# Patient Record
Sex: Female | Born: 1965
Health system: Southern US, Community
[De-identification: ages and names within clinical notes are randomized; demographics above are authoritative.]

## PROBLEM LIST (undated history)

## (undated) DIAGNOSIS — E119 Type 2 diabetes mellitus without complications: Secondary | ICD-10-CM

## (undated) DIAGNOSIS — I10 Essential (primary) hypertension: Secondary | ICD-10-CM

## (undated) DIAGNOSIS — R569 Unspecified convulsions: Secondary | ICD-10-CM

## (undated) DIAGNOSIS — R7303 Prediabetes: Secondary | ICD-10-CM

## (undated) HISTORY — DX: Unspecified convulsions: R56.9

## (undated) HISTORY — PX: EYE SURGERY: SHX253

---

## 2014-10-14 ENCOUNTER — Encounter (HOSPITAL_COMMUNITY): Payer: Self-pay | Admitting: Emergency Medicine

## 2014-10-14 ENCOUNTER — Emergency Department (HOSPITAL_COMMUNITY)
Admission: EM | Admit: 2014-10-14 | Discharge: 2014-10-14 | Disposition: A | Discharge status: Home / Self Care | Payer: Self-pay | Attending: Emergency Medicine | Admitting: Emergency Medicine

## 2014-10-14 DIAGNOSIS — R202 Paresthesia of skin: Secondary | ICD-10-CM | POA: Insufficient documentation

## 2014-10-14 DIAGNOSIS — R200 Anesthesia of skin: Secondary | ICD-10-CM | POA: Insufficient documentation

## 2014-10-14 DIAGNOSIS — I10 Essential (primary) hypertension: Secondary | ICD-10-CM | POA: Insufficient documentation

## 2014-10-14 DIAGNOSIS — B029 Zoster without complications: Secondary | ICD-10-CM | POA: Insufficient documentation

## 2014-10-14 DIAGNOSIS — Z880 Allergy status to penicillin: Secondary | ICD-10-CM | POA: Insufficient documentation

## 2014-10-14 HISTORY — DX: Essential (primary) hypertension: I10

## 2014-10-14 HISTORY — DX: Prediabetes: R73.03

## 2014-10-14 NOTE — ED Provider Notes (Signed)
CSN: [id number]     Arrival date & time 10/14/14  0930 History   First MD Initiated Contact with Patient 10/14/14 0957     Chief Complaint  Patient presents with  . Numbness  . Rash     (Consider location/radiation/quality/duration/timing/severity/associated sxs/prior Treatment) HPI   Tricia Bridges is a 49 y.o. female who presents for evaluation of rash left arm present for 7 days with intermittent tingling in her left arm for 2 weeks. No headache, neck pain, back pain, nausea, vomiting, weakness or dizziness. No known sick exposures. There are no other known modifying factors.   Past Medical History  Diagnosis Date  . Hypertension   . Pre-diabetes    Past Surgical History  Procedure Laterality Date  . Cesarean section     History reviewed. No pertinent family history. History  Substance Use Topics  . Smoking status: Never Smoker   . Smokeless tobacco: Never Used  . Alcohol Use: No   OB History    No data available     Review of Systems  All other systems reviewed and are negative.     Allergies  Penicillins  Home Medications   Prior to Admission medications   Not on File   BP 147/91 mmHg  Pulse 81  Temp(Src) 97.7 F (36.5 C) (Oral)  SpO2 99% Physical Exam  Constitutional: Tricia Bridges is oriented to person, place, and time. Tricia Bridges appears well-developed and well-nourished.  HENT:  Head: Normocephalic and atraumatic.  Right Ear: External ear normal.  Left Ear: External ear normal.  Eyes: Conjunctivae and EOM are normal. Pupils are equal, round, and reactive to light.  Neck: Normal range of motion and phonation normal. Neck supple.  Cardiovascular: Normal rate, regular rhythm and normal heart sounds.   Pulmonary/Chest: Effort normal and breath sounds normal. Tricia Bridges exhibits no bony tenderness.  Abdominal: Soft. There is no tenderness.  Musculoskeletal: Normal range of motion.  Neurological: Tricia Bridges is alert and oriented to person, place, and time. No cranial nerve  deficit or sensory deficit. Tricia Bridges exhibits normal muscle tone. Coordination normal.  No dysarthria and aphasia or nystagmus  Skin: Skin is warm, dry and intact.  Red raised papules, with excoriation and a patch about 8 cm in diameter, consistent with resolving shingles.  Psychiatric: Tricia Bridges has a normal mood and affect. Her behavior is normal. Judgment and thought content normal.  Nursing note and vitals reviewed.   ED Course  Procedures (including critical care time) Labs Review Labs Reviewed - No data to display  Imaging Review No results found.   EKG Interpretation None      MDM   Final diagnoses:  Shingles    Shingles, subacute, not amenable to treatment with an epidural medications. Tricia Bridges is not immunocompromised. Symptomatically treatment is indicated.  Nursing Notes Reviewed/ Care Coordinated Applicable Imaging Reviewed Interpretation of Laboratory Data incorporated into ED treatment  The patient appears reasonably screened and/or stabilized for discharge and I doubt any other medical condition or other [facility] requiring further screening, evaluation, or treatment in the ED at this time prior to discharge.  Plan: Home Medications- OTC prn; Home Treatments- avoid children and pregnant people until rash improved, rest; return here if the recommended treatment, does not improve the symptoms; Recommended follow up- PCP prn    Mancel BaleElliott Aianna Fahs, MD 10/14/14 1236

## 2014-10-14 NOTE — ED Notes (Signed)
Pt from home with c/o left arm tingling, numbness, and pain x 2 weeks.  A small clustered red rash on her upper arm x 1 week that is currently scabbing over.  Pt denies fevers at home, NAD, A&O, ambulatory.

## 2014-10-14 NOTE — Discharge Instructions (Signed)

## 2015-04-07 ENCOUNTER — Emergency Department (HOSPITAL_COMMUNITY)
Admission: EM | Admit: 2015-04-07 | Discharge: 2015-04-08 | Disposition: A | Discharge status: Home / Self Care | Payer: Medicaid Other | Attending: Emergency Medicine | Admitting: Emergency Medicine

## 2015-04-07 ENCOUNTER — Encounter (HOSPITAL_COMMUNITY): Payer: Self-pay | Admitting: Oncology

## 2015-04-07 DIAGNOSIS — Z880 Allergy status to penicillin: Secondary | ICD-10-CM | POA: Diagnosis not present

## 2015-04-07 DIAGNOSIS — R300 Dysuria: Secondary | ICD-10-CM | POA: Diagnosis not present

## 2015-04-07 DIAGNOSIS — R350 Frequency of micturition: Secondary | ICD-10-CM | POA: Insufficient documentation

## 2015-04-07 DIAGNOSIS — R739 Hyperglycemia, unspecified: Secondary | ICD-10-CM | POA: Diagnosis present

## 2015-04-07 DIAGNOSIS — Z79899 Other long term (current) drug therapy: Secondary | ICD-10-CM | POA: Insufficient documentation

## 2015-04-07 DIAGNOSIS — I10 Essential (primary) hypertension: Secondary | ICD-10-CM | POA: Insufficient documentation

## 2015-04-07 DIAGNOSIS — R3915 Urgency of urination: Secondary | ICD-10-CM | POA: Insufficient documentation

## 2015-04-07 LAB — BASIC METABOLIC PANEL
Anion gap: 12 (ref 5–15)
BUN: 13 mg/dL (ref 6–20)
CHLORIDE: 96 mmol/L — AB (ref 101–111)
CO2: 26 mmol/L (ref 22–32)
CREATININE: 1.15 mg/dL — AB (ref 0.44–1.00)
Calcium: 10.3 mg/dL (ref 8.9–10.3)
GFR calc Af Amer: >60 mL/min (ref >60)
GFR calc non Af Amer: 54 mL/min — ABNORMAL LOW (ref >60)
GLUCOSE: 561 mg/dL — AB (ref 65–99)
POTASSIUM: 4 mmol/L (ref 3.5–5.1)
SODIUM: 134 mmol/L — AB (ref 135–145)

## 2015-04-07 LAB — CBG MONITORING, ED
Glucose-Capillary: 535 mg/dL — ABNORMAL HIGH (ref 65–99)
Glucose-Capillary: 455 mg/dL — ABNORMAL HIGH (ref 65–99)

## 2015-04-07 LAB — URINALYSIS, ROUTINE W REFLEX MICROSCOPIC
Bilirubin Urine: NEGATIVE
KETONES UR: NEGATIVE mg/dL
LEUKOCYTES UA: NEGATIVE
Nitrite: NEGATIVE
PH: 5.5 (ref 5.0–8.0)
PROTEIN: NEGATIVE mg/dL
Specific Gravity, Urine: 1.035 — ABNORMAL HIGH (ref 1.005–1.030)

## 2015-04-07 LAB — CBC
HEMATOCRIT: 45.8 % (ref 36.0–46.0)
Hemoglobin: 15.9 g/dL — ABNORMAL HIGH (ref 12.0–15.0)
MCH: 29.1 pg (ref 26.0–34.0)
MCHC: 34.7 g/dL (ref 30.0–36.0)
MCV: 83.7 fL (ref 78.0–100.0)
PLATELETS: 271 10*3/uL (ref 150–400)
RBC: 5.47 MIL/uL — ABNORMAL HIGH (ref 3.87–5.11)
RDW: 12.7 % (ref 11.5–15.5)
WBC: 8.1 10*3/uL (ref 4.0–10.5)

## 2015-04-07 LAB — URINE MICROSCOPIC-ADD ON

## 2015-04-07 MED ORDER — INSULIN ASPART 100 UNIT/ML ~~LOC~~ SOLN
10.0000 [IU] | Freq: Once | SUBCUTANEOUS | Status: AC
  Administered 2015-04-07: 10 [IU] via SUBCUTANEOUS
  Filled 2015-04-07: qty 1

## 2015-04-07 MED ORDER — SODIUM CHLORIDE 0.9 % IV BOLUS (SEPSIS)
1000.0000 mL | Freq: Once | INTRAVENOUS | Status: AC
  Administered 2015-04-07: 1000 mL via INTRAVENOUS

## 2015-04-07 MED ORDER — HYDROCHLOROTHIAZIDE 12.5 MG PO TABS: 12.5000 mg | ORAL_TABLET | Freq: Every day | ORAL | Status: DC

## 2015-04-07 MED ORDER — ENALAPRIL MALEATE 10 MG PO TABS: 5.0000 mg | ORAL_TABLET | Freq: Every day | ORAL | Status: DC

## 2015-04-07 MED ORDER — METFORMIN HCL 500 MG PO TABS: 500.0000 mg | ORAL_TABLET | Freq: Two times a day (BID) | ORAL | Status: DC

## 2015-04-07 NOTE — Discharge Instructions (Signed)
Hyperglycemia °Hyperglycemia occurs when the glucose (sugar) in your blood is too high. Hyperglycemia can happen for many reasons, but it most often happens to people who do not know they have diabetes or are not managing their diabetes properly.  °CAUSES  °Whether you have diabetes or not, there are other causes of hyperglycemia. Hyperglycemia can occur when you have diabetes, but it can also occur in other situations that you might not be as aware of, such as: °Diabetes °· If you have diabetes and are having problems controlling your blood glucose, hyperglycemia could occur because of some of the following reasons: °¨ Not following your meal plan. °¨ Not taking your diabetes medications or not taking it properly. °¨ Exercising less or doing less activity than you normally do. °¨ Being sick. °Pre-diabetes °· This cannot be ignored. Before people develop Type 2 diabetes, they almost always have "pre-diabetes." This is when your blood glucose levels are higher than normal, but not yet high enough to be diagnosed as diabetes. Research has shown that some long-term damage to the body, especially the heart and circulatory system, may already be occurring during pre-diabetes. If you take action to manage your blood glucose when you have pre-diabetes, you may delay or prevent Type 2 diabetes from developing. °Stress °· If you have diabetes, you may be "diet" controlled or on oral medications or insulin to control your diabetes. However, you may find that your blood glucose is higher than usual in the hospital whether you have diabetes or not. This is often referred to as "stress hyperglycemia." Stress can elevate your blood glucose. This happens because of hormones put out by the body during times of stress. If stress has been the cause of your high blood glucose, it can be followed regularly by your caregiver. That way he/she can make sure your hyperglycemia does not continue to get worse or progress to  diabetes. °Steroids °· Steroids are medications that act on the infection fighting system (immune system) to block inflammation or infection. One side effect can be a rise in blood glucose. Most people can produce enough extra insulin to allow for this rise, but for those who cannot, steroids make blood glucose levels go even higher. It is not unusual for steroid treatments to "uncover" diabetes that is developing. It is not always possible to determine if the hyperglycemia will go away after the steroids are stopped. A special blood test called an A1c is sometimes done to determine if your blood glucose was elevated before the steroids were started. °SYMPTOMS °· Thirsty. °· Frequent urination. °· Dry mouth. °· Blurred vision. °· Tired or fatigue. °· Weakness. °· Sleepy. °· Tingling in feet or leg. °DIAGNOSIS  °Diagnosis is made by monitoring blood glucose in one or all of the following ways: °· A1c test. This is a chemical found in your blood. °· Fingerstick blood glucose monitoring. °· Laboratory results. °TREATMENT  °First, knowing the cause of the hyperglycemia is important before the hyperglycemia can be treated. Treatment may include, but is not be limited to: °· Education. °· Change or adjustment in medications. °· Change or adjustment in meal plan. °· Treatment for an illness, infection, etc. °· More frequent blood glucose monitoring. °· Change in exercise plan. °· Decreasing or stopping steroids. °· Lifestyle changes. °HOME CARE INSTRUCTIONS  °· Test your blood glucose as directed. °· Exercise regularly. Your caregiver will give you instructions about exercise. Pre-diabetes or diabetes which comes on with stress is helped by exercising. °· Eat wholesome,   balanced meals. Eat often and at regular, fixed times. Your caregiver or nutritionist will give you a meal plan to guide your sugar intake. °· Being at an ideal weight is important. If needed, losing as little as 10 to 15 pounds may help improve blood  glucose levels. °SEEK MEDICAL CARE IF:  °· You have questions about medicine, activity, or diet. °· You continue to have symptoms (problems such as increased thirst, urination, or weight gain). °SEEK IMMEDIATE MEDICAL CARE IF:  °· You are vomiting or have diarrhea. °· Your breath smells fruity. °· You are breathing faster or slower. °· You are very sleepy or incoherent. °· You have numbness, tingling, or pain in your feet or hands. °· You have chest pain. °· Your symptoms get worse even though you have been following your caregiver's orders. °· If you have any other questions or concerns. °  °This information is not intended to replace advice given to you by your health care provider. Make sure you discuss any questions you have with your health care provider. °  °Document Released: 09/23/2000 Document Revised: 06/22/2011 Document Reviewed: 12/04/2014 °Elsevier Interactive Patient Education ©2016 [employer] ° °

## 2015-04-07 NOTE — ED Notes (Signed)
Pt reports polydipsia, frequent urination x 2 weeks.  Pt check a CBG at home that said high.  Pt reports being borderline diabetic.  Recently moved to the area and has not obtained a PCP.  Denies pain/N/V.

## 2015-04-07 NOTE — ED Provider Notes (Signed)
CSN: [id number]     Arrival date & time 04/07/15  1946 History   First MD Initiated Contact with Patient 04/07/15 2010     Chief Complaint  Patient presents with  . Hyperglycemia     (Consider location/radiation/quality/duration/timing/severity/associated sxs/prior Treatment) HPI Comments: Patient presents to the emergency department with chief complaint of polydipsia and polyuria 2 weeks. Her significant other is a diabetic, and checked her blood sugar. The CBG read greater than 500. Patient reports that she is a borderline diabetic, and normally takes metformin, but she is new to the area and does not have a primary care doctor. She has been unable to have any of her medications filled. She denies any chest pain, shortness of breath, abdominal pain, nausea, vomiting, or diarrhea. She does report some dysuria, but denies any other symptoms. There are no aggravating or alleviating factors.  The history is provided by the patient. No language interpreter was used.    Past Medical History  Diagnosis Date  . Hypertension   . Pre-diabetes    Past Surgical History  Procedure Laterality Date  . Cesarean section     No family history on file. Social History  Substance Use Topics  . Smoking status: Never Smoker   . Smokeless tobacco: Never Used  . Alcohol Use: No   OB History    No data available     Review of Systems  Constitutional: Negative for fever and chills.  Respiratory: Negative for shortness of breath.   Cardiovascular: Negative for chest pain.  Gastrointestinal: Negative for nausea, vomiting, diarrhea and constipation.  Genitourinary: Positive for dysuria, urgency and frequency.  All other systems reviewed and are negative.     Allergies  Penicillins  Home Medications   Prior to Admission medications   Medication Sig Start Date End Date Taking? Authorizing Provider  Cholecalciferol (VITAMIN D PO) Take 1 tablet by mouth daily.   Yes Historical Provider, MD   VITAMIN E PO Take 1 tablet by mouth daily.   Yes Historical Provider, MD   BP 163/88 mmHg  Pulse 77  Temp(Src) 98.6 F (37 C) (Oral)  Resp 20  Ht 5\' 4"  (1.626 m)  Wt 81.647 kg  BMI 30.88 kg/m2  SpO2 100% Physical Exam  Constitutional: She is oriented to person, place, and time. She appears well-developed and well-nourished.  HENT:  Head: Normocephalic and atraumatic.  Eyes: Conjunctivae and EOM are normal. Pupils are equal, round, and reactive to light.  Neck: Normal range of motion. Neck supple.  Cardiovascular: Normal rate and regular rhythm.  Exam reveals no gallop and no friction rub.   No murmur heard. Pulmonary/Chest: Effort normal and breath sounds normal. No respiratory distress. She has no wheezes. She has no rales. She exhibits no tenderness.  Abdominal: Soft. Bowel sounds are normal. She exhibits no distension and no mass. There is no tenderness. There is no rebound and no guarding.  No focal abdominal tenderness, no RLQ tenderness or pain at McBurney's point, no RUQ tenderness or Murphy's sign, no left-sided abdominal tenderness, no fluid wave, or signs of peritonitis   Musculoskeletal: Normal range of motion. She exhibits no edema or tenderness.  Neurological: She is alert and oriented to person, place, and time.  Skin: Skin is warm and dry.  Psychiatric: She has a normal mood and affect. Her behavior is normal. Judgment and thought content normal.  Nursing note and vitals reviewed.   ED Course  Procedures (including critical care time) Results for orders placed or performed  during the hospital encounter of 04/07/15  Basic metabolic panel  Result Value Ref Range   Sodium 134 (L) 135 - 145 mmol/L   Potassium 4.0 3.5 - 5.1 mmol/L   Chloride 96 (L) 101 - 111 mmol/L   CO2 26 22 - 32 mmol/L   Glucose, Bld 561 (HH) 65 - 99 mg/dL   BUN 13 6 - 20 mg/dL   Creatinine, Ser 6.04 (H) 0.44 - 1.00 mg/dL   Calcium 54.0 8.9 - 98.1 mg/dL   GFR calc non Af Amer 54 (L) >60  mL/min   GFR calc Af Amer >60 >60 mL/min   Anion gap 12 5 - 15  CBC  Result Value Ref Range   WBC 8.1 4.0 - 10.5 K/uL   RBC 5.47 (H) 3.87 - 5.11 MIL/uL   Hemoglobin 15.9 (H) 12.0 - 15.0 g/dL   HCT 19.1 47.8 - 29.5 %   MCV 83.7 78.0 - 100.0 fL   MCH 29.1 26.0 - 34.0 pg   MCHC 34.7 30.0 - 36.0 g/dL   RDW 62.1 30.8 - 65.7 %   Platelets 271 150 - 400 K/uL  Urinalysis, Routine w reflex microscopic (not at [facility])  Result Value Ref Range   Color, Urine YELLOW YELLOW   APPearance CLEAR CLEAR   Specific Gravity, Urine 1.035 (H) 1.005 - 1.030   pH 5.5 5.0 - 8.0   Glucose, UA >1000 (A) NEGATIVE mg/dL   Hgb urine dipstick TRACE (A) NEGATIVE   Bilirubin Urine NEGATIVE NEGATIVE   Ketones, ur NEGATIVE NEGATIVE mg/dL   Protein, ur NEGATIVE NEGATIVE mg/dL   Nitrite NEGATIVE NEGATIVE   Leukocytes, UA NEGATIVE NEGATIVE  Urine microscopic-add on  Result Value Ref Range   Squamous Epithelial / LPF 6-30 (A) NONE SEEN   WBC, UA 6-30 0 - 5 WBC/hpf   RBC / HPF 0-5 0 - 5 RBC/hpf   Bacteria, UA MANY (A) NONE SEEN   Urine-Other YEAST PRESENT   CBG monitoring, ED  Result Value Ref Range   Glucose-Capillary 535 (H) 65 - 99 mg/dL   Comment 1 Notify RN    Comment 2 Document in Chart   CBG monitoring, ED  Result Value Ref Range   Glucose-Capillary 455 (H) 65 - 99 mg/dL   Comment 1 Notify RN    Comment 2 Document in Chart    No results found.  I have personally reviewed and evaluated these images and lab results as part of my medical decision-making.   EKG Interpretation None      MDM   Final diagnoses:  Hyperglycemia    Patient with hyperglycemia. CBG is 535, will give fluids and insulin. Doubt DKA, patient is very well-appearing. She is not having any abdominal pain, nausea, vomiting, or respiratory distress. Will check electrolytes for further evaluation. Anticipate discharge to home after fluids and insulin pending reassuring labs.  CBG trending down with fluids and insulin. Anion gap  is normal. Patient is very well-appearing. She is stable for discharge. Recommend outpatient follow-up. I will refill the patient's HCTZ and enalapril and metformin.  Recommend PCP follow-up.    Roxy Horseman, PA-C 04/07/15 8469  Arby Barrette, MD 04/20/15 [phone]

## 2015-04-08 LAB — CBG MONITORING, ED

## 2016-06-15 ENCOUNTER — Emergency Department (HOSPITAL_COMMUNITY)
Admission: EM | Admit: 2016-06-15 | Discharge: 2016-06-15 | Disposition: A | Discharge status: Home / Self Care | Payer: Medicaid Other | Attending: Emergency Medicine | Admitting: Emergency Medicine

## 2016-06-15 ENCOUNTER — Encounter (HOSPITAL_COMMUNITY): Payer: Self-pay | Admitting: Nurse Practitioner

## 2016-06-15 DIAGNOSIS — Z7982 Long term (current) use of aspirin: Secondary | ICD-10-CM | POA: Insufficient documentation

## 2016-06-15 DIAGNOSIS — E1165 Type 2 diabetes mellitus with hyperglycemia: Secondary | ICD-10-CM

## 2016-06-15 DIAGNOSIS — N289 Disorder of kidney and ureter, unspecified: Secondary | ICD-10-CM | POA: Insufficient documentation

## 2016-06-15 DIAGNOSIS — I10 Essential (primary) hypertension: Secondary | ICD-10-CM | POA: Insufficient documentation

## 2016-06-15 DIAGNOSIS — N189 Chronic kidney disease, unspecified: Secondary | ICD-10-CM

## 2016-06-15 DIAGNOSIS — E860 Dehydration: Secondary | ICD-10-CM

## 2016-06-15 HISTORY — DX: Type 2 diabetes mellitus without complications: E11.9

## 2016-06-15 LAB — BASIC METABOLIC PANEL
ANION GAP: 14 (ref 5–15)
BUN: 19 mg/dL (ref 6–20)
CALCIUM: 10.2 mg/dL (ref 8.9–10.3)
CO2: 28 mmol/L (ref 22–32)
Chloride: 96 mmol/L — ABNORMAL LOW (ref 101–111)
Creatinine, Ser: 1.41 mg/dL — ABNORMAL HIGH (ref 0.44–1.00)
GFR, EST AFRICAN AMERICAN: 49 mL/min — AB (ref >60)
GFR, EST NON AFRICAN AMERICAN: 42 mL/min — AB (ref >60)
Glucose, Bld: 838 mg/dL (ref 65–99)
POTASSIUM: 4.5 mmol/L (ref 3.5–5.1)
Sodium: 138 mmol/L (ref 135–145)

## 2016-06-15 LAB — URINALYSIS, ROUTINE W REFLEX MICROSCOPIC
Bilirubin Urine: NEGATIVE
Glucose, UA: >=500 mg/dL — AB
Hgb urine dipstick: NEGATIVE
KETONES UR: 5 mg/dL — AB
Leukocytes, UA: NEGATIVE
Nitrite: NEGATIVE
PROTEIN: NEGATIVE mg/dL
Specific Gravity, Urine: 1.026 (ref 1.005–1.030)
pH: 5 (ref 5.0–8.0)

## 2016-06-15 LAB — CBC
HEMATOCRIT: 47.5 % — AB (ref 36.0–46.0)
HEMOGLOBIN: 16 g/dL — AB (ref 12.0–15.0)
MCH: 29 pg (ref 26.0–34.0)
MCHC: 33.7 g/dL (ref 30.0–36.0)
MCV: 86.1 fL (ref 78.0–100.0)
Platelets: 232 10*3/uL (ref 150–400)
RBC: 5.52 MIL/uL — AB (ref 3.87–5.11)
RDW: 13.4 % (ref 11.5–15.5)
WBC: 10.5 10*3/uL (ref 4.0–10.5)

## 2016-06-15 LAB — CBG MONITORING, ED
GLUCOSE-CAPILLARY: 519 mg/dL — AB (ref 65–99)
Glucose-Capillary: 355 mg/dL — ABNORMAL HIGH (ref 65–99)

## 2016-06-15 MED ORDER — SODIUM CHLORIDE 0.9 % IV BOLUS (SEPSIS)
1000.0000 mL | Freq: Once | INTRAVENOUS | Status: AC
  Administered 2016-06-15: 1000 mL via INTRAVENOUS

## 2016-06-15 MED ORDER — INSULIN ASPART 100 UNIT/ML ~~LOC~~ SOLN
10.0000 [IU] | Freq: Once | SUBCUTANEOUS | Status: AC
  Administered 2016-06-15: 10 [IU] via INTRAVENOUS
  Filled 2016-06-15: qty 1

## 2016-06-15 NOTE — ED Provider Notes (Signed)
[facility]-EMERGENCY DEPT Provider Note   CSN: [id number] Arrival date & time: 06/15/16  1634     History   Chief Complaint Chief Complaint  Patient presents with  . Hyperglycemia    HPI  Tricia Bridges is a 51 y.o. female.  Pt presents to the ED today with elevated blood sugar.  She said her meter's been reading high for about 2 weeks.  She has been taking her metformin as directed.  However, she also admits to going on a fruit diet to "get away from meat."  The pt c/o feeling tired, having a dry mouth, and having a poor appetite.      Past Medical History:  Diagnosis Date  . Diabetes mellitus without complication (HCC)   . Hypertension   . Pre-diabetes     There are no active problems to display for this patient.   Past Surgical History:  Procedure Laterality Date  . CESAREAN SECTION      OB History    No data available       Home Medications    Prior to Admission medications   Medication Sig Start Date End Date Taking? Authorizing Provider  aspirin 325 MG tablet Take 325 mg by mouth daily.   Yes Historical Provider, MD  Cholecalciferol (VITAMIN D PO) Take 1 tablet by mouth daily.   Yes Historical Provider, MD  enalapril (VASOTEC) 10 MG tablet Take 0.5 tablets (5 mg total) by mouth daily. 04/07/15  Yes Roxy Horsemanobert Browning, PA-C  hydrochlorothiazide (HYDRODIURIL) 12.5 MG tablet Take 1 tablet (12.5 mg total) by mouth daily. 04/07/15  Yes Roxy Horsemanobert Browning, PA-C  metFORMIN (GLUCOPHAGE) 500 MG tablet Take 1 tablet (500 mg total) by mouth 2 (two) times daily with a meal. 04/07/15  Yes Roxy Horsemanobert Browning, PA-C  VITAMIN E PO Take 1 tablet by mouth daily.   Yes Historical Provider, MD    Family History History reviewed. No pertinent family history.  Social History Social History  Substance Use Topics  . Smoking status: Never Smoker  . Smokeless tobacco: Never Used  . Alcohol use No     Allergies   Penicillins   Review of Systems Review of Systems  Constitutional:  Positive for appetite change.  Endocrine: Positive for polydipsia and polyuria.  All other systems reviewed and are negative.    Physical Exam Updated Vital Signs BP 147/85   Pulse 93   Temp 98.9 F (37.2 C) (Oral)   Resp 18   SpO2 100%   Physical Exam  Constitutional: She is oriented to person, place, and time. She appears well-developed and well-nourished.  HENT:  Head: Normocephalic and atraumatic.  Right Ear: External ear normal.  Left Ear: External ear normal.  Nose: Nose normal.  Mouth/Throat: Mucous membranes are dry.  Eyes: Conjunctivae and EOM are normal. Pupils are equal, round, and reactive to light.  Neck: Normal range of motion. Neck supple.  Cardiovascular: Normal rate, regular rhythm, normal heart sounds and intact distal pulses.   Pulmonary/Chest: Effort normal and breath sounds normal.  Abdominal: Soft. Bowel sounds are normal.  Musculoskeletal: Normal range of motion.  Neurological: She is alert and oriented to person, place, and time.  Skin: Skin is warm.  Psychiatric: She has a normal mood and affect. Her behavior is normal. Judgment and thought content normal.  Nursing note and vitals reviewed.    ED Treatments / Results  Labs (all labs ordered are listed, but only abnormal results are displayed) Labs Reviewed  BASIC METABOLIC PANEL - Abnormal; Notable for  the following:       Result Value   Chloride 96 (*)    Glucose, Bld 838 (*)    Creatinine, Ser 1.41 (*)    GFR calc non Af Amer 42 (*)    GFR calc Af Amer 49 (*)    All other components within normal limits  CBC - Abnormal; Notable for the following:    RBC 5.52 (*)    Hemoglobin 16.0 (*)    HCT 47.5 (*)    All other components within normal limits  URINALYSIS, ROUTINE W REFLEX MICROSCOPIC - Abnormal; Notable for the following:    Color, Urine STRAW (*)    Glucose, UA >=500 (*)    Ketones, ur 5 (*)    Bacteria, UA RARE (*)    Squamous Epithelial / LPF 0-5 (*)    All other components  within normal limits  CBG MONITORING, ED - Abnormal; Notable for the following:    Glucose-Capillary >600 (*)    All other components within normal limits  CBG MONITORING, ED - Abnormal; Notable for the following:    Glucose-Capillary 519 (*)    All other components within normal limits  CBG MONITORING, ED - Abnormal; Notable for the following:    Glucose-Capillary 355 (*)    All other components within normal limits  CBG MONITORING, ED  CBG MONITORING, ED  CBG MONITORING, ED  CBG MONITORING, ED    EKG  EKG Interpretation None       Radiology No results found.  Procedures Procedures (including critical care time)  Medications Ordered in ED Medications  sodium chloride 0.9 % bolus 1,000 mL (1,000 mLs Intravenous New Bag/Given 06/15/16 1951)  insulin aspart (novoLOG) injection 10 Units (10 Units Intravenous Given 06/15/16 1950)  sodium chloride 0.9 % bolus 1,000 mL (1,000 mLs Intravenous New Bag/Given 06/15/16 1951)  insulin aspart (novoLOG) injection 10 Units (10 Units Intravenous Given 06/15/16 2057)     Initial Impression / Assessment and Plan / ED Course  I have reviewed the triage vital signs and the nursing notes.  Pertinent labs & imaging results that were available during my care of the patient were reviewed by me and considered in my medical decision making (see chart for details).    Blood sugar is much improved after 2L NS and 20 units novolog insulin.  Pt is feeling much better.  She is instructed to continue the metformin as directed by her doctor, but avoid the fruit diets.  She is encouraged to eat a diabetic diet and return if worse.  Final Clinical Impressions(s) / ED Diagnoses   Final diagnoses:  Poorly controlled type 2 diabetes mellitus (HCC)  Dehydration  Chronic renal impairment, unspecified CKD stage    New Prescriptions New Prescriptions   No medications on file     Jacalyn Lefevre, MD 06/15/16 2231

## 2016-06-15 NOTE — ED Triage Notes (Signed)
Pt presents with c/o hyperglycemia. The symptoms began about 2 weeks ago. She reports her meter has been reading HIGH at home. She has been taking her metformin daily as prescribed with no improvement in her blood sugar. She was taken off her insulin last year due to normal blood glucose levels. This week she's noticed malaise, dry mouth, poor appetite.

## 2017-07-29 DIAGNOSIS — G5603 Carpal tunnel syndrome, bilateral upper limbs: Secondary | ICD-10-CM | POA: Diagnosis not present

## 2017-08-06 ENCOUNTER — Ambulatory Visit: Payer: Self-pay | Admitting: Family Medicine

## 2017-08-09 DIAGNOSIS — G5603 Carpal tunnel syndrome, bilateral upper limbs: Secondary | ICD-10-CM | POA: Diagnosis not present

## 2017-08-13 ENCOUNTER — Encounter: Payer: Self-pay | Admitting: Family Medicine

## 2017-08-13 ENCOUNTER — Ambulatory Visit: Payer: BLUE CROSS/BLUE SHIELD | Admitting: Family Medicine

## 2017-08-13 VITALS — BP 160/100 | HR 64 | Temp 98.0°F | Ht 63.0 in | Wt 157.4 lb

## 2017-08-13 DIAGNOSIS — E1165 Type 2 diabetes mellitus with hyperglycemia: Secondary | ICD-10-CM | POA: Insufficient documentation

## 2017-08-13 DIAGNOSIS — Z23 Encounter for immunization: Secondary | ICD-10-CM | POA: Diagnosis not present

## 2017-08-13 DIAGNOSIS — E119 Type 2 diabetes mellitus without complications: Secondary | ICD-10-CM | POA: Insufficient documentation

## 2017-08-13 DIAGNOSIS — I10 Essential (primary) hypertension: Secondary | ICD-10-CM | POA: Diagnosis not present

## 2017-08-13 DIAGNOSIS — Z794 Long term (current) use of insulin: Secondary | ICD-10-CM

## 2017-08-13 DIAGNOSIS — Z1239 Encounter for other screening for malignant neoplasm of breast: Secondary | ICD-10-CM | POA: Insufficient documentation

## 2017-08-13 LAB — LIPID PANEL
Cholesterol: 269 mg/dL — ABNORMAL HIGH (ref 0–200)
HDL: 60.5 mg/dL (ref >39.00)
LDL Cholesterol: 189 mg/dL — ABNORMAL HIGH (ref 0–99)
NonHDL: 208.47
Total CHOL/HDL Ratio: 4
Triglycerides: 95 mg/dL (ref 0.0–149.0)
VLDL: 19 mg/dL (ref 0.0–40.0)

## 2017-08-13 LAB — CBC
HCT: 45 % (ref 36.0–46.0)
Hemoglobin: 15.2 g/dL — ABNORMAL HIGH (ref 12.0–15.0)
MCHC: 33.7 g/dL (ref 30.0–36.0)
MCV: 87.6 fl (ref 78.0–100.0)
Platelets: 206 10*3/uL (ref 150.0–400.0)
RBC: 5.13 Mil/uL — AB (ref 3.87–5.11)
RDW: 13.8 % (ref 11.5–15.5)
WBC: 5.7 10*3/uL (ref 4.0–10.5)

## 2017-08-13 LAB — COMPREHENSIVE METABOLIC PANEL
ALBUMIN: 4.2 g/dL (ref 3.5–5.2)
ALT: 16 U/L (ref 0–35)
AST: 15 U/L (ref 0–37)
Alkaline Phosphatase: 111 U/L (ref 39–117)
BUN: 7 mg/dL (ref 6–23)
CALCIUM: 9.7 mg/dL (ref 8.4–10.5)
CO2: 29 mEq/L (ref 19–32)
CREATININE: 0.76 mg/dL (ref 0.40–1.20)
Chloride: 100 mEq/L (ref 96–112)
GFR: 102.06 mL/min (ref >60.00)
Glucose, Bld: 261 mg/dL — ABNORMAL HIGH (ref 70–99)
Potassium: 3.5 mEq/L (ref 3.5–5.1)
Sodium: 137 mEq/L (ref 135–145)
TOTAL PROTEIN: 7.6 g/dL (ref 6.0–8.3)
Total Bilirubin: 0.4 mg/dL (ref 0.2–1.2)

## 2017-08-13 LAB — POCT UA - MICROALBUMIN: MICROALBUMIN (UR) POC: 150 mg/L

## 2017-08-13 LAB — POCT GLYCOSYLATED HEMOGLOBIN (HGB A1C): Hemoglobin A1C: 11.5

## 2017-08-13 MED ORDER — ENALAPRIL MALEATE 10 MG PO TABS: 10.0000 mg | ORAL_TABLET | Freq: Every day | ORAL | 2 refills | Status: DC

## 2017-08-13 MED ORDER — HYDROCHLOROTHIAZIDE 12.5 MG PO TABS: 25.0000 mg | ORAL_TABLET | Freq: Every day | ORAL | 2 refills | Status: DC

## 2017-08-13 MED ORDER — METFORMIN HCL 1000 MG PO TABS: 1000.0000 mg | ORAL_TABLET | Freq: Two times a day (BID) | ORAL | 2 refills | Status: DC

## 2017-08-13 MED ORDER — PEN NEEDLES 30G X 8 MM MISC
1.0000 [IU] | Freq: Every day | 2 refills | Status: DC
Start: 2017-08-13 — End: 2020-10-01

## 2017-08-13 MED ORDER — INSULIN GLARGINE 100 UNIT/ML SOLOSTAR PEN: 20.0000 [IU] | PEN_INJECTOR | Freq: Every day | SUBCUTANEOUS | 2 refills | Status: DC

## 2017-08-13 NOTE — Patient Instructions (Addendum)
It was very nice to meet you Increase enalapril to  (whole tablet) Increase hydrochlorothiazide to  Increase metformin to  twice per day Start lantus (insulin) 20 units each day.  Check blood sugars daily. Work on getting daily exercise   Diabetes Mellitus and Nutrition When you have diabetes (diabetes mellitus), it is very important to have healthy eating habits because your blood sugar (glucose) levels are greatly affected by what you eat and drink. Eating healthy foods in the appropriate amounts, at about the same times every day, can help you:  Control your blood glucose.  Lower your risk of heart disease.  Improve your blood pressure.  Reach or maintain a healthy weight.  Every person with diabetes is different, and each person has different needs for a meal plan. Your health care provider may recommend that you work with a diet and nutrition specialist (dietitian) to make a meal plan that is best for you. Your meal plan may vary depending on factors such as:  The calories you need.  The medicines you take.  Your weight.  Your blood glucose, blood pressure, and cholesterol levels.  Your activity level.  Other health conditions you have, such as heart or kidney disease.  How do carbohydrates affect me? Carbohydrates affect your blood glucose level more than any other type of food. Eating carbohydrates naturally increases the amount of glucose in your blood. Carbohydrate counting is a method for keeping track of how many carbohydrates you eat. Counting carbohydrates is important to keep your blood glucose at a healthy level, especially if you use insulin or take certain oral diabetes medicines. It is important to know how many carbohydrates you can safely have in each meal. This is different for every person. Your dietitian can help you calculate how many carbohydrates you should have at each meal and for snack. Foods that contain carbohydrates include:  Bread,  cereal, rice, pasta, and crackers.  Potatoes and corn.  Peas, beans, and lentils.  Milk and yogurt.  Fruit and juice.  Desserts, such as cakes, cookies, ice cream, and candy.  How does alcohol affect me? Alcohol can cause a sudden decrease in blood glucose (hypoglycemia), especially if you use insulin or take certain oral diabetes medicines. Hypoglycemia can be a life-threatening condition. Symptoms of hypoglycemia (sleepiness, dizziness, and confusion) are similar to symptoms of having too much alcohol. If your health care provider says that alcohol is safe for you, follow these guidelines:  Limit alcohol intake to no more than 1 drink per day for nonpregnant women and 2 drinks per day for men. One drink equals 12 oz of beer, 5 oz of wine, or 1 oz of hard liquor.  Do not drink on an empty stomach.  Keep yourself hydrated with water, diet soda, or unsweetened iced tea.  Keep in mind that regular soda, juice, and other mixers may contain a lot of sugar and must be counted as carbohydrates.  What are tips for following this plan? Reading food labels  Start by checking the serving size on the label. The amount of calories, carbohydrates, fats, and other nutrients listed on the label are based on one serving of the food. Many foods contain more than one serving per package.  Check the total grams (g) of carbohydrates in one serving. You can calculate the number of servings of carbohydrates in one serving by dividing the total carbohydrates by 15. For example, if a food has 30 g of total carbohydrates, it would be equal to 2  servings of carbohydrates.  Check the number of grams (g) of saturated and trans fats in one serving. Choose foods that have low or no amount of these fats.  Check the number of milligrams (mg) of sodium in one serving. Most people should limit total sodium intake to less than 2,300 mg per day.  Always check the nutrition information of foods labeled as "low-fat"  or "nonfat". These foods may be higher in added sugar or refined carbohydrates and should be avoided.  Talk to your dietitian to identify your daily goals for nutrients listed on the label. Shopping  Avoid buying canned, premade, or processed foods. These foods tend to be high in fat, sodium, and added sugar.  Shop around the outside edge of the grocery store. This includes fresh fruits and vegetables, bulk grains, fresh meats, and fresh dairy. Cooking  Use low-heat cooking methods, such as baking, instead of high-heat cooking methods like deep frying.  Cook using healthy oils, such as olive, canola, or sunflower oil.  Avoid cooking with butter, cream, or high-fat meats. Meal planning  Eat meals and snacks regularly, preferably at the same times every day. Avoid going long periods of time without eating.  Eat foods high in fiber, such as fresh fruits, vegetables, beans, and whole grains. Talk to your dietitian about how many servings of carbohydrates you can eat at each meal.  Eat 4-6 ounces of lean protein each day, such as lean meat, chicken, fish, eggs, or tofu. 1 ounce is equal to 1 ounce of meat, chicken, or fish, 1 egg, or 1/4 cup of tofu.  Eat some foods each day that contain healthy fats, such as avocado, nuts, seeds, and fish. Lifestyle   Check your blood glucose regularly.  Exercise at least 30 minutes 5 or more days each week, or as told by your health care provider.  Take medicines as told by your health care provider.  Do not use any products that contain nicotine or tobacco, such as cigarettes and e-cigarettes. If you need help quitting, ask your health care provider.  Work with a Veterinary surgeon or diabetes educator to identify strategies to manage stress and any emotional and social challenges. What are some questions to ask my health care provider?  Do I need to meet with a diabetes educator?  Do I need to meet with a dietitian?  What number can I call if I have  questions?  When are the best times to check my blood glucose? Where to find more information:  American Diabetes Association: diabetes.org/food-and-fitness/food  Academy of Nutrition and Dietetics: https://www.vargas.com/  [employer] of Diabetes and Digestive and Kidney Diseases (NIH): FindJewelers.cz Summary  A healthy meal plan will help you control your blood glucose and maintain a healthy lifestyle.  Working with a diet and nutrition specialist (dietitian) can help you make a meal plan that is best for you.  Keep in mind that carbohydrates and alcohol have immediate effects on your blood glucose levels. It is important to count carbohydrates and to use alcohol carefully. This information is not intended to replace advice given to you by your health care provider. Make sure you discuss any questions you have with your health care provider. Document Released: 12/25/2004 Document Revised: 05/04/2016 Document Reviewed: 05/04/2016 Elsevier Interactive Patient Education  [employer].

## 2017-08-13 NOTE — Progress Notes (Signed)
Tricia Bridges - 52 y.o. female MRN [id number]  Date of birth: 1965/04/18  Subjective Chief Complaint  Patient presents with  . New Patient (Initial Visit)    Patient is here today to establish care.  She had a couple sips of coffee with cream and sugar in the car this am.  She states that she had a nerve conduction study last week due to carpel tunnel.  Last PAP and Mammogram were completed 3-years-ago.  Colonoscopy completed 3-years-ago.  . Diabetes    She is here today to be seen for her DM.  She states that all of her readings are usually high.    . Hypertension    She also states that she is here for her HTN.  She does not get any readings outside of the office.  Denies any leg edema.    HPI Tricia Bridges is a 52 y.o. female here today to establish with new PCP.  She has a history of T2DM, HTN and is currently being evaluated by orthopedics for bilateral carpal tunnel syndrome.  She reports that it has been a while since she has seen and primary care provider.  She is not up to date on cervical cancer or breast cancer screenings.     -T2DM:  History of diabetes diagnosed a few years ago.  Previously on insulin and metformin however insulin was discontinued, she is unsure why.  She reports that blood sugars at home have been in around 300-350 and her a1c today is 11.5.  She does feel thirsty often and drinks a large amount of water throughout the day.  She denies any vision changes.  She does have numbness/tingling in her hands but denies symptoms in her feet.  She tries to incorporate a good amount of fruits and vegetables into her diet but tells me that she eats a large amount of breads and rice.  She does not exercise regularly.   -HTN:  Current treatment with enalapril and HCTZ.  No monitoring of BP at home.  Does not monitor sodium intake.  Denies chest pain, shortness of breath, palpitations, headaches and/or vision changes.   ROS:  ROS completed and negative except as noted per  HPI  Allergies  Allergen Reactions  . Penicillins Itching    Has patient had a PCN reaction causing immediate rash, facial/tongue/throat swelling, SOB or lightheadedness with hypotension: no Has patient had a PCN reaction causing severe rash involving mucus membranes or skin necrosis: no Has patient had a PCN reaction that required hospitalization: no Has patient had a PCN reaction occurring within the last 10 years: yes If all of the above answers are "NO", then may proceed with Cephalosporin use.     Past Medical History:  Diagnosis Date  . Diabetes mellitus without complication (HCC)   . Hypertension   . Pre-diabetes     Past Surgical History:  Procedure Laterality Date  . CESAREAN SECTION      Social History   Socioeconomic History  . Marital status: Single    Spouse name: Not on file  . Number of children: Not on file  . Years of education: Not on file  . Highest education level: Not on file  Occupational History  . Not on file  Social Needs  . Financial resource strain: Not on file  . Food insecurity:    Worry: Not on file    Inability: Not on file  . Transportation needs:    Medical: Not on file    Non-medical:  Not on file  Tobacco Use  . Smoking status: Never Smoker  . Smokeless tobacco: Never Used  Substance and Sexual Activity  . Alcohol use: No  . Drug use: No  . Sexual activity: Not Currently  Lifestyle  . Physical activity:    Days per week: Not on file    Minutes per session: Not on file  . Stress: Not on file  Relationships  . Social connections:    Talks on phone: Not on file    Gets together: Not on file    Attends religious service: Not on file    Active member of club or organization: Not on file    Attends meetings of clubs or organizations: Not on file    Relationship status: Not on file  Other Topics Concern  . Not on file  Social History Narrative  . Not on file    Family History  Problem Relation Age of Onset  .  Hypertension Mother   . COPD Mother        Smoker  . Alcohol abuse Father   . Hypertension Sister     Health Maintenance  Topic Date Due  . Hepatitis C Screening  18-Jul-1965  . PNEUMOCOCCAL POLYSACCHARIDE VACCINE (1) 10/18/1965  . FOOT EXAM  10/18/1973  . HIV Screening  10/19/1978  . MAMMOGRAM  08/14/2015  . OPHTHALMOLOGY EXAM  08/14/2015  . PAP SMEAR  08/13/2016  . INFLUENZA VACCINE  11/11/2017  . HEMOGLOBIN A1C  02/13/2018  . COLONOSCOPY  08/13/2024  . TETANUS/TDAP  08/14/2027    ----------------------------------------------------------------------------------------------------------------------------------------------------------------------------------------------------------------- Physical Exam BP (!) 160/100 (BP Location: Left Arm, Cuff Size: Normal)   Pulse 64   Temp 98 F (36.7 C) (Oral)   Ht  (1.6 m)   Wt 157 lb 6.4 oz (71.4 kg)   SpO2 98%   BMI 27.88 kg/m   Physical Exam  Constitutional: She is oriented to person, place, and time. She appears well-nourished. No distress.  HENT:  Head: Normocephalic and atraumatic.  Mouth/Throat: Oropharynx is clear and moist.  Eyes: No scleral icterus.  Neck: Neck supple. No thyromegaly present.  Cardiovascular: Normal rate, regular rhythm, normal heart sounds and intact distal pulses.  Pulses:      Dorsalis pedis pulses are 2+ on the right side, and 2+ on the left side.       Posterior tibial pulses are 2+ on the right side, and 2+ on the left side.  Pulmonary/Chest: Effort normal and breath sounds normal.  Musculoskeletal:       Right foot: There is normal range of motion and no deformity.       Left foot: There is normal range of motion and no deformity.  Feet:  Right Foot:  Protective Sensation: 4 sites tested. 4 sites sensed.  Skin Integrity: Negative for ulcer, blister, skin breakdown, erythema, warmth, callus or dry skin.  Left Foot:  Protective Sensation: 4 sites tested. 4 sites sensed.  Skin Integrity:  Negative for ulcer, blister, skin breakdown, erythema, warmth, callus or dry skin.  Lymphadenopathy:    She has no cervical adenopathy.  Neurological: She is alert and oriented to person, place, and time.  Skin: No rash noted.  Psychiatric: She has a normal mood and affect. Her behavior is normal.    ------------------------------------------------------------------------------------------------------------------------------------------------------------------------------------------------------------------- Assessment and Plan  Essential hypertension Uncontrolled Increase enalapril and HCTZ Follow low salt diet Incorporate daily activity/exercise. Update labs today.   Type 2 diabetes mellitus with hyperglycemia, without long-term current use of insulin (HCC)  Uncontrolled Increased metformin to  Start lantus 20 units daily Follow low carb diet Incorporate exercise/activity each day +microalbumin, check renal function and increase enalapril.  Pneumovax given today.   Breast cancer screening Screening mammogram ordered.

## 2017-08-13 NOTE — Assessment & Plan Note (Signed)
Uncontrolled Increase enalapril and HCTZ Follow low salt diet Incorporate daily activity/exercise. Update labs today.

## 2017-08-13 NOTE — Assessment & Plan Note (Signed)
Uncontrolled Increased metformin to  Start lantus 20 units daily Follow low carb diet Incorporate exercise/activity each day +microalbumin, check renal function and increase enalapril.  Pneumovax given today.

## 2017-08-13 NOTE — Assessment & Plan Note (Signed)
Screening mammogram ordered

## 2017-08-16 LAB — TSH: TSH: 0.59 u[IU]/mL (ref 0.35–4.50)

## 2017-08-17 DIAGNOSIS — G5602 Carpal tunnel syndrome, left upper limb: Secondary | ICD-10-CM | POA: Diagnosis not present

## 2017-08-17 DIAGNOSIS — G5601 Carpal tunnel syndrome, right upper limb: Secondary | ICD-10-CM | POA: Diagnosis not present

## 2017-08-18 ENCOUNTER — Encounter: Payer: Self-pay | Admitting: Emergency Medicine

## 2017-08-24 ENCOUNTER — Telehealth: Payer: Self-pay | Admitting: Family Medicine

## 2017-08-24 NOTE — Telephone Encounter (Signed)
Called in and was given lab results from Dr. Everrett Coombe ordered on 08/13/17.    Glucose is high.  This should improve with new medication.  (Metformin per pt).  Be sure to follow  A low carb diet. LDL is quite high and she has a high risk of developing cardiovascular disease and/or stroke.   I would recommend starting a medication to lower her cholesterol (Atorvastatin 40 mg).  Other labs look ok.  She is agreeable to starting the Atorvastatin.  I instructed her to check with her pharmacy in a day or two.  She mentioned that the metformin is causing her to be nauseated.   The dose was changed to 1,000 mg  Twice a day.   I let her know I would let Dr. Everrett Coombe know about her decision to start the Atorvastatin and about the nausea related to the metformin.  I routed a note to Dr. Ashley Royalty.

## 2017-08-24 NOTE — Telephone Encounter (Signed)
Please advise 

## 2017-08-25 ENCOUNTER — Encounter (INDEPENDENT_AMBULATORY_CARE_PROVIDER_SITE_OTHER): Payer: Self-pay | Admitting: Ophthalmology

## 2017-08-25 ENCOUNTER — Other Ambulatory Visit: Payer: Self-pay | Admitting: Family Medicine

## 2017-08-25 MED ORDER — METFORMIN HCL ER 750 MG PO TB24: 1500.0000 mg | ORAL_TABLET | Freq: Every day | ORAL | 3 refills | Status: DC

## 2017-08-25 MED ORDER — ATORVASTATIN CALCIUM 40 MG PO TABS: 40.0000 mg | ORAL_TABLET | Freq: Every day | ORAL | 3 refills | Status: DC

## 2017-08-25 NOTE — Telephone Encounter (Signed)
Left a VM for patient to give the office a call back regarding medication change

## 2017-08-25 NOTE — Telephone Encounter (Signed)
Rx for atorvastatin sent in Will change metformin to metformin er, this should be easier on her stomach.  She should take two tabs in the morning each day.

## 2017-08-26 NOTE — Telephone Encounter (Signed)
Left a VM for patient to give the office a call back.  

## 2017-08-30 ENCOUNTER — Encounter: Payer: Self-pay | Admitting: Emergency Medicine

## 2017-08-30 ENCOUNTER — Encounter (INDEPENDENT_AMBULATORY_CARE_PROVIDER_SITE_OTHER): Payer: Self-pay | Admitting: Ophthalmology

## 2017-08-30 NOTE — Telephone Encounter (Signed)
Attempted to contact patient regarding the medication change. Will send out unable to reach letter.

## 2017-09-08 ENCOUNTER — Ambulatory Visit: Payer: BLUE CROSS/BLUE SHIELD

## 2017-09-24 ENCOUNTER — Other Ambulatory Visit (HOSPITAL_COMMUNITY)
Admission: RE | Admit: 2017-09-24 | Discharge: 2017-09-24 | Disposition: A | Discharge status: Home / Self Care | Payer: BLUE CROSS/BLUE SHIELD | Source: Ambulatory Visit | Attending: Family Medicine | Admitting: Family Medicine

## 2017-09-24 ENCOUNTER — Telehealth: Payer: Self-pay | Admitting: Family Medicine

## 2017-09-24 ENCOUNTER — Ambulatory Visit (INDEPENDENT_AMBULATORY_CARE_PROVIDER_SITE_OTHER): Payer: BLUE CROSS/BLUE SHIELD | Admitting: Family Medicine

## 2017-09-24 ENCOUNTER — Encounter: Payer: Self-pay | Admitting: Family Medicine

## 2017-09-24 VITALS — BP 150/96 | HR 71 | Temp 98.1°F | Ht 63.0 in | Wt 159.2 lb

## 2017-09-24 DIAGNOSIS — E1165 Type 2 diabetes mellitus with hyperglycemia: Secondary | ICD-10-CM

## 2017-09-24 DIAGNOSIS — I10 Essential (primary) hypertension: Secondary | ICD-10-CM | POA: Diagnosis not present

## 2017-09-24 DIAGNOSIS — Z0000 Encounter for general adult medical examination without abnormal findings: Secondary | ICD-10-CM | POA: Insufficient documentation

## 2017-09-24 DIAGNOSIS — Z124 Encounter for screening for malignant neoplasm of cervix: Secondary | ICD-10-CM

## 2017-09-24 MED ORDER — FLUTICASONE PROPIONATE 50 MCG/ACT NA SUSP: 2.0000 | Freq: Every day | NASAL | 6 refills | Status: DC

## 2017-09-24 MED ORDER — GLUCOSE BLOOD VI STRP: ORAL_STRIP | 99 refills | Status: DC

## 2017-09-24 MED ORDER — AMLODIPINE BESYLATE 5 MG PO TABS: 5.0000 mg | ORAL_TABLET | Freq: Every day | ORAL | 1 refills | Status: DC

## 2017-09-24 NOTE — Telephone Encounter (Signed)
Copied from CRM [phone]. Topic: Quick Communication - Rx Refill/Question >> Sep 24, 2017  2:30 PM Tricia Bridges, Tricia Bridges wrote: Medication: contour next strips  Pt states she was to call back with name of her strips. Pt test BID  90 day Also request the nasal spray [id number] [place] - [id number] [facility] [address] [phone] (Phone) [phone] (Fax)

## 2017-09-24 NOTE — Assessment & Plan Note (Signed)
BP is elevated, will add on amlodipine Follow low salt diet She will continue to monitor at home.

## 2017-09-24 NOTE — Telephone Encounter (Signed)
Left detail massage inform the pt.  

## 2017-09-24 NOTE — Assessment & Plan Note (Signed)
She is not monitoring glucose at home, instructed to call and let us know what type of strips she is using.

## 2017-09-24 NOTE — Progress Notes (Signed)
Tricia Bridges - 52 y.o. female MRN [id number]  Date of birth: 01/17/1966  Subjective Chief Complaint  Patient presents with  . Annual Exam    CPE--fasting--pap and breast exam/hep c and no foot doctor/req flonase    HPI [place] Tricia Bridges is a 52 y.o. female with history of T2DM, HTN and HLD here today for annual exam.  She wishes to have pap completed today as well.  She has not had a pap smear in several years.  She is post menopausal and denies any bleeding/spotting or vaginal discharge.  She was scheduled for a mammogram however missed her appointment, she plans to call to reschedule this.  In regards to her diabetes she is not monitoring her glucose at home as she is out of testing strips.  She is unsure what meter she has but will call to let me know so that we can get some strips sent in for her.  Her BP is noted to still be elevated today as well, reports she is taking her medications as directed.  BP readings have been elevated at home as well.  She denies any symptoms at this time.  She has been working on improving her diet and exercise some.    Review of Systems  Constitutional: Negative for chills, fever, malaise/fatigue and weight loss.  HENT: Negative for congestion, ear pain and sore throat.   Eyes: Negative for blurred vision, double vision and pain.  Respiratory: Negative for cough and shortness of breath.   Cardiovascular: Negative for chest pain and palpitations.  Gastrointestinal: Negative for abdominal pain, blood in stool, constipation, heartburn and nausea.  Genitourinary: Negative for dysuria and urgency.  Musculoskeletal: Negative for joint pain and myalgias.  Neurological: Negative for dizziness and headaches.  Endo/Heme/Allergies: Does not bruise/bleed easily.  Psychiatric/Behavioral: Negative for depression. The patient is not nervous/anxious and does not have insomnia.     Allergies  Allergen Reactions  . Penicillins Itching    Has patient had a PCN reaction  causing immediate rash, facial/tongue/throat swelling, SOB or lightheadedness with hypotension: no Has patient had a PCN reaction causing severe rash involving mucus membranes or skin necrosis: no Has patient had a PCN reaction that required hospitalization: no Has patient had a PCN reaction occurring within the last 10 years: yes If all of the above answers are "NO", then may proceed with Cephalosporin use.     Past Medical History:  Diagnosis Date  . Diabetes mellitus without complication (HCC)   . Hypertension   . Pre-diabetes     Past Surgical History:  Procedure Laterality Date  . CESAREAN SECTION      Social History   Socioeconomic History  . Marital status: Single    Spouse name: Not on file  . Number of children: Not on file  . Years of education: Not on file  . Highest education level: Not on file  Occupational History  . Not on file  Social Needs  . Financial resource strain: Not on file  . Food insecurity:    Worry: Not on file    Inability: Not on file  . Transportation needs:    Medical: Not on file    Non-medical: Not on file  Tobacco Use  . Smoking status: Never Smoker  . Smokeless tobacco: Never Used  Substance and Sexual Activity  . Alcohol use: No  . Drug use: No  . Sexual activity: Not Currently  Lifestyle  . Physical activity:    Days per week: Not on file  Minutes per session: Not on file  . Stress: Not on file  Relationships  . Social connections:    Talks on phone: Not on file    Gets together: Not on file    Attends religious service: Not on file    Active member of club or organization: Not on file    Attends meetings of clubs or organizations: Not on file    Relationship status: Not on file  Other Topics Concern  . Not on file  Social History Narrative  . Not on file    Family History  Problem Relation Age of Onset  . Hypertension Mother   . COPD Mother        Smoker  . Alcohol abuse Father   . Hypertension Sister      Health Maintenance  Topic Date Due  . Hepatitis C Screening  1965/05/16  . FOOT EXAM  10/18/1973  . MAMMOGRAM  08/14/2015  . OPHTHALMOLOGY EXAM  08/14/2015  . PAP SMEAR  08/13/2016  . HIV Screening  09/25/2018 (Originally 10/19/1978)  . INFLUENZA VACCINE  11/11/2017  . HEMOGLOBIN A1C  02/13/2018  . PNEUMOCOCCAL POLYSACCHARIDE VACCINE (2) 08/14/2022  . COLONOSCOPY  08/13/2024  . TETANUS/TDAP  08/14/2027    ----------------------------------------------------------------------------------------------------------------------------------------------------------------------------------------------------------------- Physical Exam BP (!) 150/96   Pulse 71   Temp 98.1 F (36.7 C) (Oral)   Ht 5\' 3"  (1.6 m)   Wt 159 lb 3.2 oz (72.2 kg)   SpO2 98%   BMI 28.20 kg/m   Physical Exam  Constitutional: She is oriented to person, place, and time. She appears well-nourished. No distress.  HENT:  Head: Normocephalic and atraumatic.  Nose: Nose normal.  Mouth/Throat: Oropharynx is clear and moist.  Eyes: Conjunctivae are normal. No scleral icterus.  Neck: Normal range of motion. Neck supple. No thyromegaly present.  Cardiovascular: Normal rate, regular rhythm and normal heart sounds.  Pulses:      Dorsalis pedis pulses are 2+ on the right side, and 2+ on the left side.       Posterior tibial pulses are 2+ on the right side, and 2+ on the left side.  Pulmonary/Chest: Effort normal and breath sounds normal. Right breast exhibits no inverted nipple, no mass, no nipple discharge, no skin change and no tenderness. Left breast exhibits no inverted nipple, no mass, no nipple discharge, no skin change and no tenderness.  Abdominal: Soft. Bowel sounds are normal. She exhibits no distension. There is no tenderness. There is no guarding.  Genitourinary: Vagina normal and uterus normal. There is no rash or lesion on the right labia. There is no rash or lesion on the left labia. Cervix exhibits no motion  tenderness and no discharge. Right adnexum displays no mass and no tenderness. Left adnexum displays no mass and no tenderness. No erythema, tenderness or bleeding in the vagina. No vaginal discharge found.  Genitourinary Comments: Chaperoned by Livingston Diones, LPN  Musculoskeletal: Normal range of motion. She exhibits no edema.       Right foot: There is normal range of motion and no deformity.       Left foot: There is normal range of motion and no deformity.  Feet:  Right Foot:  Protective Sensation: 4 sites tested. 4 sites sensed.  Skin Integrity: Negative for ulcer, blister, skin breakdown, erythema, warmth, callus or dry skin.  Left Foot:  Protective Sensation: 4 sites tested. 4 sites sensed.  Skin Integrity: Negative for ulcer, blister, skin breakdown, erythema, warmth, callus or dry skin.  Lymphadenopathy:  She has no cervical adenopathy.  Neurological: She is oriented to person, place, and time.  Skin: Skin is warm and dry. No rash noted.  Psychiatric: She has a normal mood and affect. Her behavior is normal.    ------------------------------------------------------------------------------------------------------------------------------------------------------------------------------------------------------------------- Assessment and Plan  Well adult exam Well adult Screenings: Pap completed today, she will reschedule mammogram Immunizations: Up to date Anticipatory guidance/Risk factor reduction:  See AVS   Type 2 diabetes mellitus with hyperglycemia, without long-term current use of insulin (HCC) She is not monitoring glucose at home, instructed to call and let us know what type of strips she is using.   Essential hypertension BP is elevated, will add on amlodipine Follow low salt diet She will continue to monitor at home.

## 2017-09-24 NOTE — Assessment & Plan Note (Signed)
Well adult Screenings: Pap completed today, she will reschedule mammogram Immunizations: Up to date Anticipatory guidance/Risk factor reduction:  See AVS

## 2017-09-24 NOTE — Patient Instructions (Signed)

## 2017-09-28 LAB — CYTOLOGY - PAP
DIAGNOSIS: NEGATIVE
HPV (WINDOPATH): NOT DETECTED

## 2017-09-28 NOTE — Progress Notes (Signed)
Normal pap wit negative HPV.

## 2017-10-21 ENCOUNTER — Encounter (INDEPENDENT_AMBULATORY_CARE_PROVIDER_SITE_OTHER): Payer: Self-pay | Admitting: Ophthalmology

## 2017-11-12 ENCOUNTER — Telehealth: Payer: Self-pay | Admitting: Family Medicine

## 2017-11-12 NOTE — Telephone Encounter (Signed)
Left vm for the pt to call back.     Copied from CRM [phone]. Topic: Quick Communication - Lab Results >> Nov 12, 2017 10:03 AM Oneal GroutSebastian, Jennifer S wrote: Requesting lab results from June

## 2017-11-16 NOTE — Telephone Encounter (Signed)
Left a message for pt to return phone call to see what labs they are requesting.

## 2017-11-19 NOTE — Telephone Encounter (Signed)
Left vm for the pt to call back.  

## 2017-11-23 NOTE — Telephone Encounter (Signed)
Pt scheduled for Thursday 8.15, will discuss then.

## 2017-11-24 ENCOUNTER — Other Ambulatory Visit: Payer: Self-pay

## 2017-11-25 ENCOUNTER — Other Ambulatory Visit: Payer: Self-pay

## 2017-11-25 ENCOUNTER — Ambulatory Visit (INDEPENDENT_AMBULATORY_CARE_PROVIDER_SITE_OTHER): Payer: BLUE CROSS/BLUE SHIELD | Admitting: Family Medicine

## 2017-11-25 ENCOUNTER — Encounter: Payer: Self-pay | Admitting: Family Medicine

## 2017-11-25 VITALS — BP 114/84 | HR 72 | Temp 98.2°F | Ht 63.0 in | Wt 155.0 lb

## 2017-11-25 DIAGNOSIS — I10 Essential (primary) hypertension: Secondary | ICD-10-CM | POA: Diagnosis not present

## 2017-11-25 DIAGNOSIS — E119 Type 2 diabetes mellitus without complications: Secondary | ICD-10-CM | POA: Diagnosis not present

## 2017-11-25 DIAGNOSIS — E782 Mixed hyperlipidemia: Secondary | ICD-10-CM | POA: Diagnosis not present

## 2017-11-25 DIAGNOSIS — Z794 Long term (current) use of insulin: Secondary | ICD-10-CM

## 2017-11-25 LAB — HEPATIC FUNCTION PANEL
ALK PHOS: 73 U/L (ref 39–117)
ALT: 20 U/L (ref 0–35)
AST: 18 U/L (ref 0–37)
Albumin: 4.2 g/dL (ref 3.5–5.2)
BILIRUBIN DIRECT: 0.1 mg/dL (ref 0.0–0.3)
BILIRUBIN TOTAL: 0.4 mg/dL (ref 0.2–1.2)
Total Protein: 7.5 g/dL (ref 6.0–8.3)

## 2017-11-25 LAB — LIPID PANEL
CHOL/HDL RATIO: 3
Cholesterol: 163 mg/dL (ref 0–200)
HDL: 54 mg/dL (ref >39.00)
LDL CALC: 97 mg/dL (ref 0–99)
NONHDL: 109.07
Triglycerides: 62 mg/dL (ref 0.0–149.0)
VLDL: 12.4 mg/dL (ref 0.0–40.0)

## 2017-11-25 LAB — HEMOGLOBIN A1C: HEMOGLOBIN A1C: 7.6 % — AB (ref 4.6–6.5)

## 2017-11-25 MED ORDER — VITAMIN E 400 UNITS PO TABS: 1.0000 | ORAL_TABLET | Freq: Every day | ORAL | 3 refills | Status: DC

## 2017-11-25 MED ORDER — ASPIRIN EC 81 MG PO TBEC: 81.0000 mg | DELAYED_RELEASE_TABLET | Freq: Every day | ORAL | 3 refills | Status: DC

## 2017-11-25 NOTE — Progress Notes (Signed)
Tricia Bridges - 52 y.o. female MRN [id number]  Date of birth: 07/23/1965  Subjective Chief Complaint  Patient presents with  . Follow-up    F/U for DM, readings have been 115-160 and has been keeping a list, been feeling good.  . medication refills    needs Vit E and aspirin refills    HPI Tricia Bridges is a 52 y.o. female with history of HTN, T2DM and HLD here today for follow up of her chronic conditions.    -HTN:  Compliant with current medications and denies side effects.  She does not monitor her BP at home.  She has been trying to limit her intake of high salt foods.  She is not exercising regularly at this time.  She denies headache, vision change, shortness of breath, palpitations, or anginal symptoms.  -T2DM:  Reports compliance with current medications.  She brings in a log of recent blood sugars with readings ranging from 114-150.  She did have an outlier glucose of 250 but reports that she "cheated" this day.  She tells me that she is feeling much better now that her blood sugars have improved some.  She denies any symptoms of hypoglycemia.    -HLD:  Previous LDL of 189, started on atorvastatin 40mg  in 08/2017.  Reports she is taking this and tolerating well.  She denies myalgias or abdominal pain/nausea.    ROS:  A comprehensive ROS was completed and negative except as noted per HPI   Allergies  Allergen Reactions  . Penicillins Itching    Has patient had a PCN reaction causing immediate rash, facial/tongue/throat swelling, SOB or lightheadedness with hypotension: no Has patient had a PCN reaction causing severe rash involving mucus membranes or skin necrosis: no Has patient had a PCN reaction that required hospitalization: no Has patient had a PCN reaction occurring within the last 10 years: yes If all of the above answers are "NO", then may proceed with Cephalosporin use.     Past Medical History:  Diagnosis Date  . Diabetes mellitus without complication (HCC)   .  Hypertension   . Pre-diabetes     Past Surgical History:  Procedure Laterality Date  . CESAREAN SECTION      Social History   Socioeconomic History  . Marital status: Single    Spouse name: Not on file  . Number of children: Not on file  . Years of education: Not on file  . Highest education level: Not on file  Occupational History  . Not on file  Social Needs  . Financial resource strain: Not on file  . Food insecurity:    Worry: Not on file    Inability: Not on file  . Transportation needs:    Medical: Not on file    Non-medical: Not on file  Tobacco Use  . Smoking status: Never Smoker  . Smokeless tobacco: Never Used  Substance and Sexual Activity  . Alcohol use: No  . Drug use: No  . Sexual activity: Not Currently  Lifestyle  . Physical activity:    Days per week: Not on file    Minutes per session: Not on file  . Stress: Not on file  Relationships  . Social connections:    Talks on phone: Not on file    Gets together: Not on file    Attends religious service: Not on file    Active member of club or organization: Not on file    Attends meetings of clubs or organizations: Not on  file    Relationship status: Not on file  Other Topics Concern  . Not on file  Social History Narrative  . Not on file    Family History  Problem Relation Age of Onset  . Hypertension Mother   . COPD Mother        Smoker  . Alcohol abuse Father   . Hypertension Sister     Health Maintenance  Topic Date Due  . Hepatitis C Screening  10/26/1963  . FOOT EXAM  10/18/1973  . MAMMOGRAM  08/14/2015  . OPHTHALMOLOGY EXAM  08/14/2015  . INFLUENZA VACCINE  11/11/2017  . HIV Screening  09/25/2018 (Originally 10/19/1978)  . HEMOGLOBIN A1C  02/13/2018  . PAP SMEAR  09/24/2020  . COLONOSCOPY  08/13/2024  . TETANUS/TDAP  08/14/2027  . PNEUMOCOCCAL POLYSACCHARIDE VACCINE AGE 88-64 HIGH RISK  Completed     ----------------------------------------------------------------------------------------------------------------------------------------------------------------------------------------------------------------- Physical Exam BP 114/84 (BP Location: Left Arm, Patient Position: Sitting, Cuff Size: Normal)   Pulse 72   Temp 98.2 F (36.8 C) (Oral)   Ht 5\' 3"  (1.6 m)   Wt 155 lb (70.3 kg)   SpO2 98%   BMI 27.46 kg/m   Physical Exam  Constitutional: She is oriented to person, place, and time. She appears well-nourished. No distress.  HENT:  Head: Normocephalic and atraumatic.  Mouth/Throat: Oropharynx is clear and moist.  Eyes: No scleral icterus.  Neck: Neck supple. No thyromegaly present.  Cardiovascular: Normal rate, regular rhythm and normal heart sounds.  Pulses:      Dorsalis pedis pulses are 2+ on the right side, and 2+ on the left side.       Posterior tibial pulses are 2+ on the right side, and 2+ on the left side.  Pulmonary/Chest: Effort normal and breath sounds normal.  Musculoskeletal: She exhibits no edema.       Right foot: There is normal range of motion and no deformity.       Left foot: There is normal range of motion and no deformity.  Feet:  Right Foot:  Protective Sensation: 4 sites tested. 4 sites sensed.  Skin Integrity: Negative for ulcer, blister, skin breakdown, erythema, warmth, callus or dry skin.  Left Foot:  Protective Sensation: 4 sites tested. 4 sites sensed.  Skin Integrity: Negative for ulcer, blister, skin breakdown, erythema, warmth, callus or dry skin.  Lymphadenopathy:    She has no cervical adenopathy.  Neurological: She is alert and oriented to person, place, and time. No cranial nerve deficit.  Skin: Skin is warm and dry. No rash noted.  Psychiatric: She has a normal mood and affect. Her behavior is normal.     ------------------------------------------------------------------------------------------------------------------------------------------------------------------------------------------------------------------- Assessment and Plan  Mixed hyperlipidemia Reports compliance and good tolerance to atorvastatin.  Update LFT's and FLP today.   Essential hypertension BP is stable She will continue current medications Reminded to follow low salt diet.   Type 2 diabetes mellitus without complication, with long-term current use of insulin (HCC) Improved based on home glucose readings She will continue current medications Update A1c today.   F/u 3 months.

## 2017-11-25 NOTE — Patient Instructions (Signed)
Blood sugars are looking better, keep up the hard work and continue to focus on making dietary changes to control sugars.  I will see you back in about 3 months.

## 2017-11-25 NOTE — Assessment & Plan Note (Signed)
BP is stable She will continue current medications Reminded to follow low salt diet.

## 2017-11-25 NOTE — Assessment & Plan Note (Signed)
Reports compliance and good tolerance to atorvastatin.  Update LFT's and FLP today.

## 2017-11-25 NOTE — Assessment & Plan Note (Signed)
Improved based on home glucose readings She will continue current medications Update A1c today.   F/u 3 months.

## 2018-01-12 ENCOUNTER — Other Ambulatory Visit: Payer: Self-pay | Admitting: Family Medicine

## 2018-02-25 ENCOUNTER — Ambulatory Visit: Payer: BLUE CROSS/BLUE SHIELD | Admitting: Family Medicine

## 2018-02-25 ENCOUNTER — Encounter: Payer: Self-pay | Admitting: Family Medicine

## 2018-02-25 VITALS — BP 130/90 | HR 93 | Temp 97.9°F | Wt 160.2 lb

## 2018-02-25 DIAGNOSIS — Z23 Encounter for immunization: Secondary | ICD-10-CM | POA: Diagnosis not present

## 2018-02-25 DIAGNOSIS — Z794 Long term (current) use of insulin: Secondary | ICD-10-CM

## 2018-02-25 DIAGNOSIS — E119 Type 2 diabetes mellitus without complications: Secondary | ICD-10-CM

## 2018-02-25 DIAGNOSIS — I10 Essential (primary) hypertension: Secondary | ICD-10-CM | POA: Diagnosis not present

## 2018-02-25 LAB — HEMOGLOBIN A1C: Hgb A1c MFr Bld: 7.3 % — ABNORMAL HIGH (ref 4.6–6.5)

## 2018-02-25 LAB — BASIC METABOLIC PANEL
BUN: 14 mg/dL (ref 6–23)
CO2: 33 mEq/L — ABNORMAL HIGH (ref 19–32)
CREATININE: 0.85 mg/dL (ref 0.40–1.20)
Calcium: 9.6 mg/dL (ref 8.4–10.5)
Chloride: 102 mEq/L (ref 96–112)
GFR: 89.52 mL/min (ref >60.00)
Glucose, Bld: 170 mg/dL — ABNORMAL HIGH (ref 70–99)
POTASSIUM: 3.6 meq/L (ref 3.5–5.1)
Sodium: 142 mEq/L (ref 135–145)

## 2018-02-25 NOTE — Assessment & Plan Note (Signed)
-  Update a1c and will make adjustments as needed to lantus.  -Continue metformin -F/u 3 months.

## 2018-02-25 NOTE — Assessment & Plan Note (Signed)
-  BP is well controlled, continue current medication.  

## 2018-02-25 NOTE — Progress Notes (Signed)
Tricia Bridges - 52 y.o. female MRN [id number]  Date of birth: 07/06/1965  Subjective Chief Complaint  Patient presents with  . Hypertension  . Diabetes    HPI Tricia Bridges is a 52 y.o. female with history of HTN and T2DM here today for follow up visit.   -HTN:  Current treatment with enalapril, amlodipine and HCTZ.  Doing well with current medications.  She is compliant with current medications and denies side effects including symptoms of hypotension.  She denies chest pain, shortness of breath, palpitations, headache.   -T2DM:  Current treatment with metformin and lantus.  Using 20units of lantus daily.  Blood sugar this morning was 145 after a bagel.  Typically in the 120-130 range.  She denies symptoms of hypoglycemia.   ROS:  A comprehensive ROS was completed and negative except as noted per HPI  Allergies  Allergen Reactions  . Penicillins Itching    Has patient had a PCN reaction causing immediate rash, facial/tongue/throat swelling, SOB or lightheadedness with hypotension: no Has patient had a PCN reaction causing severe rash involving mucus membranes or skin necrosis: no Has patient had a PCN reaction that required hospitalization: no Has patient had a PCN reaction occurring within the last 10 years: yes If all of the above answers are "NO", then may proceed with Cephalosporin use.     Past Medical History:  Diagnosis Date  . Diabetes mellitus without complication (HCC)   . Hypertension   . Pre-diabetes     Past Surgical History:  Procedure Laterality Date  . CESAREAN SECTION      Social History   Socioeconomic History  . Marital status: Single    Spouse name: Not on file  . Number of children: Not on file  . Years of education: Not on file  . Highest education level: Not on file  Occupational History  . Not on file  Social Needs  . Financial resource strain: Not on file  . Food insecurity:    Worry: Not on file    Inability: Not on file  .  Transportation needs:    Medical: Not on file    Non-medical: Not on file  Tobacco Use  . Smoking status: Never Smoker  . Smokeless tobacco: Never Used  Substance and Sexual Activity  . Alcohol use: No  . Drug use: No  . Sexual activity: Not Currently  Lifestyle  . Physical activity:    Days per week: Not on file    Minutes per session: Not on file  . Stress: Not on file  Relationships  . Social connections:    Talks on phone: Not on file    Gets together: Not on file    Attends religious service: Not on file    Active member of club or organization: Not on file    Attends meetings of clubs or organizations: Not on file    Relationship status: Not on file  Other Topics Concern  . Not on file  Social History Narrative  . Not on file    Family History  Problem Relation Age of Onset  . Hypertension Mother   . COPD Mother        Smoker  . Alcohol abuse Father   . Hypertension Sister     Health Maintenance  Topic Date Due  . Hepatitis C Screening  [birth year]  . FOOT EXAM  10/18/1973  . MAMMOGRAM  08/14/2015  . OPHTHALMOLOGY EXAM  08/14/2015  . INFLUENZA VACCINE  11/11/2017  .  HIV Screening  09/25/2018 (Originally 10/19/1978)  . HEMOGLOBIN A1C  05/28/2018  . PAP SMEAR  09/24/2020  . COLONOSCOPY  08/13/2024  . TETANUS/TDAP  08/14/2027  . PNEUMOCOCCAL POLYSACCHARIDE VACCINE AGE 60-64 HIGH RISK  Completed    ----------------------------------------------------------------------------------------------------------------------------------------------------------------------------------------------------------------- Physical Exam BP 130/90   Pulse 93   Temp 97.9 F (36.6 C) (Oral)   Wt 160 lb 3.2 oz (72.7 kg)   SpO2 96%   BMI 28.38 kg/m   Physical Exam  Constitutional: She appears well-nourished. No distress.  HENT:  Head: Normocephalic and atraumatic.  Mouth/Throat: Oropharynx is clear and moist.  Eyes: No scleral icterus.  Neck: Neck supple. No thyromegaly  present.  Cardiovascular: Normal rate, regular rhythm and normal heart sounds.  Pulmonary/Chest: Effort normal and breath sounds normal.  Neurological: She is alert.  Skin: Skin is warm and dry.  Psychiatric: She has a normal mood and affect. Her behavior is normal.    ------------------------------------------------------------------------------------------------------------------------------------------------------------------------------------------------------------------- Assessment and Plan  Essential hypertension BP is well controlled, continue current medication  Type 2 diabetes mellitus without complication, with long-term current use of insulin (HCC) -Update a1c and will make adjustments as needed to lantus.  -Continue metformin -F/u 3 months.

## 2018-02-25 NOTE — Patient Instructions (Addendum)
-  Great to see you -We'll be in touch with lab results.  -I will see you back in about 3 months.

## 2018-03-01 NOTE — Progress Notes (Signed)
A1c improved from last check, she should continue current medications.

## 2018-05-30 ENCOUNTER — Ambulatory Visit: Payer: BLUE CROSS/BLUE SHIELD | Admitting: Family Medicine

## 2018-06-05 ENCOUNTER — Other Ambulatory Visit: Payer: Self-pay | Admitting: Family Medicine

## 2018-08-09 ENCOUNTER — Telehealth: Payer: Self-pay | Admitting: Family Medicine

## 2018-08-09 NOTE — Telephone Encounter (Signed)
Called and could not leave a vm. Calling patient to schedule virtual visit with Dr. Ashley Royalty

## 2018-09-03 ENCOUNTER — Other Ambulatory Visit: Payer: Self-pay | Admitting: Family Medicine

## 2020-09-16 ENCOUNTER — Inpatient Hospital Stay (HOSPITAL_COMMUNITY)
Admission: EM | Admit: 2020-09-16 | Discharge: 2020-09-22 | DRG: 100 | Disposition: A | Discharge status: Home / Self Care | Payer: Self-pay | Attending: Internal Medicine | Admitting: Internal Medicine

## 2020-09-16 ENCOUNTER — Other Ambulatory Visit: Payer: Self-pay

## 2020-09-16 ENCOUNTER — Emergency Department (HOSPITAL_COMMUNITY): Payer: Self-pay

## 2020-09-16 ENCOUNTER — Inpatient Hospital Stay (HOSPITAL_COMMUNITY): Payer: Self-pay

## 2020-09-16 ENCOUNTER — Encounter (HOSPITAL_COMMUNITY): Payer: Self-pay

## 2020-09-16 DIAGNOSIS — M6282 Rhabdomyolysis: Secondary | ICD-10-CM

## 2020-09-16 DIAGNOSIS — G40901 Epilepsy, unspecified, not intractable, with status epilepticus: Principal | ICD-10-CM | POA: Diagnosis present

## 2020-09-16 DIAGNOSIS — Z20822 Contact with and (suspected) exposure to covid-19: Secondary | ICD-10-CM | POA: Diagnosis present

## 2020-09-16 DIAGNOSIS — E119 Type 2 diabetes mellitus without complications: Secondary | ICD-10-CM

## 2020-09-16 DIAGNOSIS — I10 Essential (primary) hypertension: Secondary | ICD-10-CM | POA: Diagnosis present

## 2020-09-16 DIAGNOSIS — G9341 Metabolic encephalopathy: Secondary | ICD-10-CM | POA: Diagnosis present

## 2020-09-16 DIAGNOSIS — D72829 Elevated white blood cell count, unspecified: Secondary | ICD-10-CM

## 2020-09-16 DIAGNOSIS — E876 Hypokalemia: Secondary | ICD-10-CM | POA: Diagnosis present

## 2020-09-16 DIAGNOSIS — E1101 Type 2 diabetes mellitus with hyperosmolarity with coma: Secondary | ICD-10-CM | POA: Diagnosis present

## 2020-09-16 DIAGNOSIS — Z7984 Long term (current) use of oral hypoglycemic drugs: Secondary | ICD-10-CM

## 2020-09-16 DIAGNOSIS — J9602 Acute respiratory failure with hypercapnia: Secondary | ICD-10-CM | POA: Diagnosis present

## 2020-09-16 DIAGNOSIS — E1165 Type 2 diabetes mellitus with hyperglycemia: Secondary | ICD-10-CM

## 2020-09-16 DIAGNOSIS — R81 Glycosuria: Secondary | ICD-10-CM | POA: Diagnosis present

## 2020-09-16 DIAGNOSIS — Z9114 Patient's other noncompliance with medication regimen: Secondary | ICD-10-CM

## 2020-09-16 DIAGNOSIS — J9811 Atelectasis: Secondary | ICD-10-CM | POA: Diagnosis present

## 2020-09-16 DIAGNOSIS — Z79899 Other long term (current) drug therapy: Secondary | ICD-10-CM

## 2020-09-16 DIAGNOSIS — Z794 Long term (current) use of insulin: Secondary | ICD-10-CM

## 2020-09-16 DIAGNOSIS — Z825 Family history of asthma and other chronic lower respiratory diseases: Secondary | ICD-10-CM

## 2020-09-16 DIAGNOSIS — Z880 Allergy status to penicillin: Secondary | ICD-10-CM

## 2020-09-16 DIAGNOSIS — E872 Acidosis: Secondary | ICD-10-CM | POA: Diagnosis present

## 2020-09-16 DIAGNOSIS — Z8249 Family history of ischemic heart disease and other diseases of the circulatory system: Secondary | ICD-10-CM

## 2020-09-16 DIAGNOSIS — Z7982 Long term (current) use of aspirin: Secondary | ICD-10-CM

## 2020-09-16 DIAGNOSIS — J690 Pneumonitis due to inhalation of food and vomit: Secondary | ICD-10-CM | POA: Diagnosis present

## 2020-09-16 DIAGNOSIS — E860 Dehydration: Secondary | ICD-10-CM | POA: Diagnosis present

## 2020-09-16 DIAGNOSIS — Z811 Family history of alcohol abuse and dependence: Secondary | ICD-10-CM

## 2020-09-16 DIAGNOSIS — N179 Acute kidney failure, unspecified: Secondary | ICD-10-CM | POA: Diagnosis present

## 2020-09-16 DIAGNOSIS — Z8673 Personal history of transient ischemic attack (TIA), and cerebral infarction without residual deficits: Secondary | ICD-10-CM

## 2020-09-16 LAB — I-STAT ARTERIAL BLOOD GAS, ED
Acid-base deficit: 10 mmol/L — ABNORMAL HIGH (ref 0.0–2.0)
Acid-base deficit: 5 mmol/L — ABNORMAL HIGH (ref 0.0–2.0)
Bicarbonate: 25.1 mmol/L (ref 20.0–28.0)
Bicarbonate: 19.3 mmol/L — ABNORMAL LOW (ref 20.0–28.0)
Calcium, Ion: 1.36 mmol/L (ref 1.15–1.40)
Calcium, Ion: 1.19 mmol/L (ref 1.15–1.40)
HCT: 48 % — ABNORMAL HIGH (ref 36.0–46.0)
HCT: 46 % (ref 36.0–46.0)
Hemoglobin: 16.3 g/dL — ABNORMAL HIGH (ref 12.0–15.0)
Hemoglobin: 15.6 g/dL — ABNORMAL HIGH (ref 12.0–15.0)
O2 Saturation: 90 %
O2 Saturation: 100 %
Patient temperature: 100
Potassium: 4.2 mmol/L (ref 3.5–5.1)
Potassium: 4.3 mmol/L (ref 3.5–5.1)
Sodium: 142 mmol/L (ref 135–145)
Sodium: 144 mmol/L (ref 135–145)
TCO2: 28 mmol/L (ref 22–32)
TCO2: 20 mmol/L — ABNORMAL LOW (ref 22–32)
pCO2 arterial: 103.8 mmHg (ref 32.0–48.0)
pCO2 arterial: 36 mmHg (ref 32.0–48.0)
pH, Arterial: 6.992 — CL (ref 7.350–7.450)
pH, Arterial: 7.341 — ABNORMAL LOW (ref 7.350–7.450)
pO2, Arterial: 91 mmHg (ref 83.0–108.0)
pO2, Arterial: 251 mmHg — ABNORMAL HIGH (ref 83.0–108.0)

## 2020-09-16 LAB — GLUCOSE, CAPILLARY
Glucose-Capillary: 393 mg/dL — ABNORMAL HIGH (ref 70–99)
Glucose-Capillary: 516 mg/dL (ref 70–99)

## 2020-09-16 LAB — RAPID URINE DRUG SCREEN, HOSP PERFORMED
Amphetamines: NOT DETECTED
Barbiturates: NOT DETECTED
Benzodiazepines: POSITIVE — AB
Cocaine: NOT DETECTED
Opiates: NOT DETECTED
Tetrahydrocannabinol: NOT DETECTED

## 2020-09-16 LAB — CBC WITH DIFFERENTIAL/PLATELET
Abs Immature Granulocytes: 0.11 10*3/uL — ABNORMAL HIGH (ref 0.00–0.07)
Basophils Absolute: 0.1 10*3/uL (ref 0.0–0.1)
Basophils Relative: 0 %
Eosinophils Absolute: 0 10*3/uL (ref 0.0–0.5)
Eosinophils Relative: 0 %
HCT: 49.1 % — ABNORMAL HIGH (ref 36.0–46.0)
Hemoglobin: 15.5 g/dL — ABNORMAL HIGH (ref 12.0–15.0)
Immature Granulocytes: 1 %
Lymphocytes Relative: 4 %
Lymphs Abs: 0.7 10*3/uL (ref 0.7–4.0)
MCH: 28.7 pg (ref 26.0–34.0)
MCHC: 31.6 g/dL (ref 30.0–36.0)
MCV: 90.9 fL (ref 80.0–100.0)
Monocytes Absolute: 0.9 10*3/uL (ref 0.1–1.0)
Monocytes Relative: 5 %
Neutro Abs: 18.2 10*3/uL — ABNORMAL HIGH (ref 1.7–7.7)
Neutrophils Relative %: 90 %
Platelets: 246 10*3/uL (ref 150–400)
RBC: 5.4 MIL/uL — ABNORMAL HIGH (ref 3.87–5.11)
RDW: 12.4 % (ref 11.5–15.5)
WBC: 20 10*3/uL — ABNORMAL HIGH (ref 4.0–10.5)
nRBC: 0 % (ref 0.0–0.2)

## 2020-09-16 LAB — COMPREHENSIVE METABOLIC PANEL
ALT: 29 U/L (ref 0–44)
AST: 32 U/L (ref 15–41)
Albumin: 3.7 g/dL (ref 3.5–5.0)
Alkaline Phosphatase: 135 U/L — ABNORMAL HIGH (ref 38–126)
Anion gap: 13 (ref 5–15)
BUN: 18 mg/dL (ref 6–20)
CO2: 21 mmol/L — ABNORMAL LOW (ref 22–32)
Calcium: 9.2 mg/dL (ref 8.9–10.3)
Chloride: 103 mmol/L (ref 98–111)
Creatinine, Ser: 1.2 mg/dL — ABNORMAL HIGH (ref 0.44–1.00)
GFR, Estimated: 53 mL/min — ABNORMAL LOW (ref >60)
Glucose, Bld: 623 mg/dL (ref 70–99)
Potassium: 4.2 mmol/L (ref 3.5–5.1)
Sodium: 137 mmol/L (ref 135–145)
Total Bilirubin: 0.5 mg/dL (ref 0.3–1.2)
Total Protein: 7.4 g/dL (ref 6.5–8.1)

## 2020-09-16 LAB — LACTIC ACID, PLASMA: Lactic Acid, Venous: 5.7 mmol/L (ref 0.5–1.9)

## 2020-09-16 LAB — TROPONIN I (HIGH SENSITIVITY): Troponin I (High Sensitivity): 18 ng/L — ABNORMAL HIGH (ref <18)

## 2020-09-16 LAB — BETA-HYDROXYBUTYRIC ACID: Beta-Hydroxybutyric Acid: 0.91 mmol/L — ABNORMAL HIGH (ref 0.05–0.27)

## 2020-09-16 MED ORDER — ORAL CARE MOUTH RINSE
15.0000 mL | OROMUCOSAL | Status: DC
  Administered 2020-09-16 – 2020-09-18 (×14): 15 mL via OROMUCOSAL

## 2020-09-16 MED ORDER — DOCUSATE SODIUM 100 MG PO CAPS: 100.0000 mg | ORAL_CAPSULE | Freq: Two times a day (BID) | ORAL | Status: DC | PRN

## 2020-09-16 MED ORDER — FENTANYL 2500MCG IN NS 250ML (10MCG/ML) PREMIX INFUSION
50.0000 ug/h | INTRAVENOUS | Status: DC
  Administered 2020-09-16 – 2020-09-17 (×2): 50 ug/h via INTRAVENOUS
  Filled 2020-09-16: qty 250

## 2020-09-16 MED ORDER — LORAZEPAM 2 MG/ML IJ SOLN: 2.0000 mg | Freq: Once | INTRAMUSCULAR | Status: AC

## 2020-09-16 MED ORDER — CHLORHEXIDINE GLUCONATE 0.12% ORAL RINSE (MEDLINE KIT)
15.0000 mL | Freq: Two times a day (BID) | OROMUCOSAL | Status: DC
  Administered 2020-09-16 – 2020-09-18 (×4): 15 mL via OROMUCOSAL

## 2020-09-16 MED ORDER — LEVETIRACETAM IN NACL 1500 MG/100ML IV SOLN
1500.0000 mg | Freq: Once | INTRAVENOUS | Status: AC
  Administered 2020-09-16: 1500 mg via INTRAVENOUS
  Filled 2020-09-16: qty 100

## 2020-09-16 MED ORDER — LABETALOL HCL 5 MG/ML IV SOLN
INTRAVENOUS | Status: AC
  Administered 2020-09-16: 20 mg via INTRAVENOUS
  Filled 2020-09-16: qty 4

## 2020-09-16 MED ORDER — CHLORHEXIDINE GLUCONATE CLOTH 2 % EX PADS
6.0000 | MEDICATED_PAD | Freq: Every day | CUTANEOUS | Status: DC
  Administered 2020-09-17 – 2020-09-22 (×6): 6 via TOPICAL

## 2020-09-16 MED ORDER — MIDAZOLAM 50MG/50ML (1MG/ML) PREMIX INFUSION
0.5000 mg/h | INTRAVENOUS | Status: DC
  Administered 2020-09-16: 0.5 mg/h via INTRAVENOUS
  Filled 2020-09-16: qty 50

## 2020-09-16 MED ORDER — ONDANSETRON HCL 4 MG/2ML IJ SOLN: 4.0000 mg | Freq: Four times a day (QID) | INTRAMUSCULAR | Status: DC | PRN

## 2020-09-16 MED ORDER — LEVETIRACETAM IN NACL 1000 MG/100ML IV SOLN
1000.0000 mg | Freq: Once | INTRAVENOUS | Status: AC
  Administered 2020-09-16: 1000 mg via INTRAVENOUS
  Filled 2020-09-16: qty 100

## 2020-09-16 MED ORDER — THIAMINE HCL 100 MG/ML IJ SOLN: 100.0000 mg | Freq: Every day | INTRAMUSCULAR | Status: DC

## 2020-09-16 MED ORDER — LORAZEPAM 2 MG/ML IJ SOLN
INTRAMUSCULAR | Status: AC
  Administered 2020-09-16: 2 mg via INTRAVENOUS
  Filled 2020-09-16: qty 1

## 2020-09-16 MED ORDER — DEXTROSE 50 % IV SOLN: 0.0000 mL | INTRAVENOUS | Status: DC | PRN

## 2020-09-16 MED ORDER — POTASSIUM CHLORIDE 10 MEQ/100ML IV SOLN
10.0000 meq | INTRAVENOUS | Status: AC
  Administered 2020-09-16 (×2): 10 meq via INTRAVENOUS
  Filled 2020-09-16 (×3): qty 100

## 2020-09-16 MED ORDER — LACTATED RINGERS IV BOLUS
20.0000 mL/kg | Freq: Once | INTRAVENOUS | Status: AC
  Administered 2020-09-16: 1454 mL via INTRAVENOUS

## 2020-09-16 MED ORDER — THIAMINE HCL 100 MG/ML IJ SOLN
500.0000 mg | Freq: Three times a day (TID) | INTRAVENOUS | Status: AC
  Administered 2020-09-17 – 2020-09-18 (×5): 500 mg via INTRAVENOUS
  Filled 2020-09-16 (×7): qty 5

## 2020-09-16 MED ORDER — PROPOFOL 1000 MG/100ML IV EMUL: 5.0000 ug/kg/min | INTRAVENOUS | Status: DC

## 2020-09-16 MED ORDER — DOCUSATE SODIUM 50 MG/5ML PO LIQD
100.0000 mg | Freq: Two times a day (BID) | ORAL | Status: DC
  Administered 2020-09-16 – 2020-09-18 (×4): 100 mg
  Filled 2020-09-16 (×4): qty 10

## 2020-09-16 MED ORDER — LORAZEPAM 2 MG/ML IJ SOLN
2.0000 mg | Freq: Once | INTRAMUSCULAR | Status: AC
  Administered 2020-09-16: 2 mg via INTRAVENOUS

## 2020-09-16 MED ORDER — HEPARIN SODIUM (PORCINE) 5000 UNIT/ML IJ SOLN
5000.0000 [IU] | Freq: Three times a day (TID) | INTRAMUSCULAR | Status: DC
  Administered 2020-09-16 – 2020-09-21 (×16): 5000 [IU] via SUBCUTANEOUS
  Filled 2020-09-16 (×17): qty 1

## 2020-09-16 MED ORDER — POLYETHYLENE GLYCOL 3350 17 G PO PACK
17.0000 g | PACK | Freq: Every day | ORAL | Status: DC
  Administered 2020-09-17 – 2020-09-18 (×2): 17 g
  Filled 2020-09-16 (×3): qty 1

## 2020-09-16 MED ORDER — FENTANYL BOLUS VIA INFUSION
50.0000 ug | INTRAVENOUS | Status: DC | PRN
  Administered 2020-09-17: 100 ug via INTRAVENOUS
  Administered 2020-09-17 (×2): 50 ug via INTRAVENOUS
  Administered 2020-09-18: 100 ug via INTRAVENOUS
  Administered 2020-09-18 (×2): 50 ug via INTRAVENOUS
  Administered 2020-09-18: 100 ug via INTRAVENOUS
  Filled 2020-09-16: qty 100

## 2020-09-16 MED ORDER — PROPOFOL 1000 MG/100ML IV EMUL
0.0000 ug/kg/min | INTRAVENOUS | Status: DC
  Administered 2020-09-16 – 2020-09-17 (×4): 50 ug/kg/min via INTRAVENOUS
  Filled 2020-09-16 (×4): qty 100

## 2020-09-16 MED ORDER — FENTANYL CITRATE (PF) 100 MCG/2ML IJ SOLN: 50.0000 ug | Freq: Once | INTRAMUSCULAR | Status: DC

## 2020-09-16 MED ORDER — SODIUM CHLORIDE 0.9 % IV SOLN
2.0000 g | Freq: Two times a day (BID) | INTRAVENOUS | Status: DC
  Administered 2020-09-17: 2 g via INTRAVENOUS
  Filled 2020-09-16 (×3): qty 20

## 2020-09-16 MED ORDER — INSULIN REGULAR(HUMAN) IN NACL 100-0.9 UT/100ML-% IV SOLN
INTRAVENOUS | Status: AC
  Administered 2020-09-16: 5.5 [IU]/h via INTRAVENOUS
  Filled 2020-09-16: qty 100

## 2020-09-16 MED ORDER — SODIUM CHLORIDE 0.9 % IV SOLN: 2500.0000 mg | Freq: Two times a day (BID) | INTRAVENOUS | Status: DC

## 2020-09-16 MED ORDER — DEXTROSE IN LACTATED RINGERS 5 % IV SOLN: INTRAVENOUS | Status: DC

## 2020-09-16 MED ORDER — ACETAMINOPHEN 325 MG PO TABS
650.0000 mg | ORAL_TABLET | Freq: Four times a day (QID) | ORAL | Status: DC | PRN
  Administered 2020-09-17 – 2020-09-19 (×2): 650 mg via NASOGASTRIC
  Filled 2020-09-16 (×2): qty 2

## 2020-09-16 MED ORDER — ETOMIDATE 2 MG/ML IV SOLN
30.0000 mg | Freq: Once | INTRAVENOUS | Status: AC
  Administered 2020-09-16: 30 mg via INTRAVENOUS

## 2020-09-16 MED ORDER — SODIUM CHLORIDE 0.9 % IV BOLUS
1000.0000 mL | Freq: Once | INTRAVENOUS | Status: AC
  Administered 2020-09-16: 1000 mL via INTRAVENOUS

## 2020-09-16 MED ORDER — ROCURONIUM BROMIDE 10 MG/ML (PF) SYRINGE
100.0000 mg | PREFILLED_SYRINGE | Freq: Once | INTRAVENOUS | Status: AC
  Administered 2020-09-16: 100 mg via INTRAVENOUS

## 2020-09-16 MED ORDER — LACTATED RINGERS IV SOLN: INTRAVENOUS | Status: DC

## 2020-09-16 MED ORDER — LABETALOL HCL 5 MG/ML IV SOLN: 20.0000 mg | Freq: Once | INTRAVENOUS | Status: AC

## 2020-09-16 MED ORDER — LORAZEPAM 2 MG/ML IJ SOLN
2.0000 mg | Freq: Once | INTRAMUSCULAR | Status: AC
  Filled 2020-09-16: qty 1

## 2020-09-16 MED ORDER — PANTOPRAZOLE SODIUM 40 MG IV SOLR
40.0000 mg | Freq: Every day | INTRAVENOUS | Status: DC
  Administered 2020-09-16 – 2020-09-18 (×3): 40 mg via INTRAVENOUS
  Filled 2020-09-16 (×3): qty 40

## 2020-09-16 MED ORDER — THIAMINE HCL 100 MG/ML IJ SOLN
250.0000 mg | Freq: Every day | INTRAVENOUS | Status: DC
  Administered 2020-09-19 – 2020-09-21 (×3): 250 mg via INTRAVENOUS
  Filled 2020-09-16 (×4): qty 2.5

## 2020-09-16 MED ORDER — POLYETHYLENE GLYCOL 3350 17 G PO PACK: 17.0000 g | PACK | Freq: Every day | ORAL | Status: DC | PRN

## 2020-09-16 NOTE — Consult Note (Signed)
NEUROLOGY CONSULTATION NOTE   Date of service: September 16, 2020 Patient Name: Tricia Bridges MRN:  [id number] DOB:  1965/07/18 Reason for consult: "Seizure" Requesting Provider: Cheryll Cockayne, MD _ _ _   _ __   _ __ _ _  __ __   _ __   __ _  History of Present Illness  Tricia Bridges is a 55 y.o. female with PMH significant for HTN, Pre-diabetes, who presents with multiple seizures. Family found her on the floor unresponsive. She had 2 seizures enroute and 3 witnessed GTC seizures in the ED, not returning to her baseline in between seizures. She was last seen normal about 12 hours ago. She was intubated in the ED for failure to protect her airway. She was given 7.5mg  of Versed by EMS, given Ativan 2mg  x 2 and loaded with Keppra 2500mg  in the ED. She was started on Propofol after intubation.  Spoke to patient's daughter rthe phone who lives with patient. She came in to find her passed out on the bedroom floor. Patient had thrown up and peed on herself. Upon turning her, she had another seizure.   No history of seizures. Works at night. Nothing seemed wrong with her over the last few days. She does not drink alcohol. Does not use any recreational substances. Does not smoke. Recently had been using the bathroom more than normally. No concerns for potential SI, no concern for an overdose, no new medications that she is aware of.  Workup with glucose of 600, BP at presentation of 130/92, no fever, ABG with pH of 6.992 and pCO2 of 103.8, elevated WBC count to 20K, CXR with increased curvilinear right lung base atelectasis.  Patient is unable to provide any meaningful history secondary to intubation and sedation.   ROS   Unable to obtain 2/2 intubation and sedation.  Past History   Past Medical History:  Diagnosis Date  . Diabetes mellitus without complication (HCC)   . Hypertension   . Pre-diabetes    Past Surgical History:  Procedure Laterality Date  . CESAREAN SECTION     Family  History  Problem Relation Age of Onset  . Hypertension Mother   . COPD Mother        Smoker  . Alcohol abuse Father   . Hypertension Sister    Social History   Socioeconomic History  . Marital status: Single    Spouse name: Not on file  . Number of children: Not on file  . Years of education: Not on file  . Highest education level: Not on file  Occupational History  . Not on file  Tobacco Use  . Smoking status: Never Smoker  . Smokeless tobacco: Never Used  Vaping Use  . Vaping Use: Never used  Substance and Sexual Activity  . Alcohol use: No  . Drug use: No  . Sexual activity: Not Currently  Other Topics Concern  . Not on file  Social History Narrative  . Not on file   Social Determinants of Health   Financial Resource Strain: Not on file  Food Insecurity: Not on file  Transportation Needs: Not on file  Physical Activity: Not on file  Stress: Not on file  Social Connections: Not on file   Allergies  Allergen Reactions  . Penicillins Itching    Has patient had a PCN reaction causing immediate rash, facial/tongue/throat swelling, SOB or lightheadedness with hypotension: no Has patient had a PCN reaction causing severe rash involving mucus membranes or  skin necrosis: no Has patient had a PCN reaction that required hospitalization: no Has patient had a PCN reaction occurring within the last 10 years: yes If all of the above answers are "NO", then may proceed with Cephalosporin use.     Medications  (Not in a hospital admission)    Vitals   Vitals:   09/16/20 1748 09/16/20 1850  Pulse: (!) 137   Resp: (!) 30   SpO2: 98% 100%     There is no height or weight on file to calculate BMI.  Physical Exam   General: Laying comfortably in bed; intubated. HENT: Bite mark on tongue but otherwise, normal oropharynx and mucosa. Normal external appearance of ears and nose. Neck: Supple, no pain or tenderness CV: No JVD. No peripheral edema. Pulmonary: Symmetric  Chest rise. Breathing over vent. Abdomen: Soft to touch, non-tender. Ext: No cyanosis, edema, or deformity Skin: No rash. Normal palpation of skin.  Musculoskeletal: Normal digits and nails by inspection. No clubbing.  Neurologic Examination on Versed gtt at 0.5mg /hr and about an hour after rocuronium was given.  Mental status/Cognition:  Does not open eyes to voice or loud clap. No response to nares stimulation. Does seem to have some spontaneous movement in BL upper extremities.  Brainstem: Corneals + BL Pupils: 82mm, equal round and reactive. Cough: intact(noted to have some spontaneous coughing too) Gag: unable to elicit, unable to get the tongue depressor or suction cath all the way back into her pharynx. Dolls eyes: Not tested as unclear if she had any C spine trauma.  Motor/sensory:  Muscle bulk: normal, tone flaccid in all extremities. BL upper extremities flexion ?posturing to pinch or nailbed pressure. Bl lower extremities, no response to pinch or nailbed pressure.  Reflexes:  Right Left Comments  Pectoralis      Biceps (C5/6) 1 1   Brachioradialis (C5/6) 1 1    Triceps (C6/7) 1 1    Patellar (L3/4) 1 1    Achilles (S1)      Hoffman      Plantar     Jaw jerk    Coordination/Complex Motor:  Unable to assess.  Labs   CBC:  Recent Labs  Lab 09/16/20 1748 09/16/20 1809  WBC 20.0*  --   NEUTROABS 18.2*  --   HGB 15.5* 16.3*  HCT 49.1* 48.0*  MCV 90.9  --   PLT 246  --     Basic Metabolic Panel:  Lab Results  Component Value Date   NA 142 09/16/2020   K 4.2 09/16/2020   CO2 33 (H) 02/25/2018   GLUCOSE 170 (H) 02/25/2018   BUN 14 02/25/2018   CREATININE 0.85 02/25/2018   CALCIUM 9.6 02/25/2018   GFRNONAA 42 (L) 06/15/2016   GFRAA 49 (L) 06/15/2016   Lipid Panel:  Lab Results  Component Value Date   LDLCALC 97 11/25/2017   HgbA1c:  Lab Results  Component Value Date   HGBA1C 7.3 (H) 02/25/2018   Urine Drug Screen: No results found for:  LABOPIA, COCAINSCRNUR, LABBENZ, AMPHETMU, THCU, LABBARB  Alcohol Level No results found for: ETH  CT Head without contrast: CTH was negative for a large hypodensity concerning for a large territory infarct or hyperdensity concerning for an ICH. Does seem to have symmetric BL basal ganglia calcification.  MRI Brain: Pending, after cEEG  cEEG:  Pending  Notable labs: Glucose of 623 Tmax of 100 Hypertensive to 200s systolic after intubation. Leukocytosis to 20,000. Beta-hydroxybutyric acid: 0.91.   Impression  Glen Blatchley is a 55 y.o. female with PMH significant for diabetes, HTN who was found down and had several seizures with clustering. Intubated in the ED and on Versed and Keppra. Her neurologic examination on Versed and about an hour after Rocuronium was notable for intact, cough, pupils, corneals, maybe some flexion posturing of BL upper extremities with nailbed pressure or pinch. No obvious seizure activity noted on my evaluation. Notable labs with hyperglycemia, leukocytosis. Daughter reported using bathroom a lot more over the last few days, ? Polyuria due to diabetes.  Likely seizures and status in the setting of hyperglycemia. Low threshold to start meningitis coverage if she has fevers.  Impression: Status epilepticus Non-ketotic hyperglycemia  Recommendations  - cEEG ordered. - further AEDs based on cEEG. - Ativan 2mg  IV PRN for any clinical seizure activity lasting more than 2 mins - Seizure precautions - MRI Brain with and without contrast after cEEG. - I ordered Ammonia, Thiamine, Serum Drug screen, Alcohol levels, Co-oximetry panel, HIV. - Low threshold to start empiric meningitis coverage with Vanc, Ceftriaxone(meningitis dose), Ampicillin and Acyclovir if she has fevers or uptrending WBC count. ______________________________________________________________________  This patient is critically ill and at significant risk of neurological worsening, death and  care requires constant monitoring of vital signs, hemodynamics,respiratory and cardiac monitoring, neurological assessment, discussion with family, other specialists and medical decision making of high complexity. I spent 60 minutes of neurocritical care time  in the care of  this patient. This was time spent independent of any time provided by nurse practitioner or PA.  [facility] Pager Number Erick [phone] 09/16/2020  10:52 PM  Thank you for the opportunity to take part in the care of this patient. If you have any further questions, please contact the neurology consultation attending.  Signed,  11/16/2020 [facility] Pager Number Erick Blinks _ _ _   _ __   _ __ _ _  __ __   _ __   __ _

## 2020-09-16 NOTE — Progress Notes (Signed)
Not available for EEG in process of going to room.

## 2020-09-16 NOTE — ED Triage Notes (Signed)
Pt arrived via [facility] from home for 3 seizures she had. Pt does not have hx off same. Per EMS, pt's family found pt on floor unresponsive. Pt LKW yesterday night. Per had witnessed seizure at home. Pt had 3 more witnessed tonic clonic seizures that this RN and Dr Audley Hose witnessed. Dr Audley Hose at pt bedside. Pt is sinus tach on the monitor

## 2020-09-16 NOTE — Progress Notes (Addendum)
eLink Physician-Brief Progress Note Patient Name: Tricia Bridges DOB: 05/04/1965 MRN: [id number]   Date of Service  09/16/2020  HPI/Events of Note  55 yr old female admitted to ICU for status epilepticus, asp Pneumonia to RLL, on vent , HHNK on insuline gtt.AKI  Camera: Sinus tachy at 123, MAP 125 sats 99% on vent. In synchrony.PRVC 420/08/30/58% On versed 1.5, prop 30, fenta 50. Temp 103  Data: Reviewed BG 623. Wbc 20 K. LA 5.7 BHB < 1. Covid pending Tox: benzos + 7.34/36/251/20 EKG: sinus tachy. qtc 475. ST flat V6. Non specific changes.  eICU Interventions  - continue current plan of care. - on insulin gtt. Getting kcl for 2 doses.  - follow LA, BMP q4 hr. Follow Insuline protocol.  - prn anti HTN meds - sedation. sz precautions - lung protective ventilation. On Vent bundle. Wean fio2 to keep sats 90 to 95%.  - follow vEEG - Neurology consultation - follow troponin. - on VTE: sq heparin.     Intervention Category Major Interventions: Respiratory failure - evaluation and management;Seizures - evaluation and management;Hyperglycemia - active titration of insulin therapy Evaluation Type: New Patient Evaluation  Ranee Gosselin 09/16/2020, 10:18 PM

## 2020-09-16 NOTE — Progress Notes (Signed)
eLink Physician-Brief Progress Note Patient Name: Jiyah Torpey DOB: June 19, 1965 MRN: [id number]   Date of Service  09/16/2020  HPI/Events of Note  Fever > 103. Asking for tylenol via NG tube, not oral.  eICU Interventions  Ordered     Intervention Category Intermediate Interventions: Other:  Ranee Gosselin 09/16/2020, 10:48 PM

## 2020-09-16 NOTE — ED Provider Notes (Addendum)
[facility] Provider Note   CSN: [id number] Arrival date & time: 09/16/20  1740     History Chief Complaint  Patient presents with  . Seizures    [name] is a 55 y.o. female.  Brought in by ambulance for altered mental status and seizure-like activity.  Per history provided by EMS, patient was last seen last night.  She typically works night shift and should come back and family had assumed that she was sleeping throughout the day.  They went to check on her as evening approaches she had not woken up.  They found her on the ground.  As they were trying to wake her up she had a reported shaking episode that appeared like a seizure.  EMS was called as the patient remained unresponsive.  Upon MS arrival they witnessed 2 additional episodes of back-to-back seizures generalized shaking episode and tongue biting.  EMS gave her 7.5 mg of Versed with subsequent resolution of seizure activity, patient remained unresponsive for EMS and was brought to the ER for further evaluation.  Blood glucose level per EMS was 600 on route.        Past Medical History:  Diagnosis Date  . Diabetes mellitus without complication (HCC)   . Hypertension   . Pre-diabetes     Patient Active Problem List   Diagnosis Date Noted  . Mixed hyperlipidemia 11/25/2017  . Well adult exam 09/24/2017  . Type 2 diabetes mellitus without complication, with long-term current use of insulin (HCC) 08/13/2017  . Essential hypertension 08/13/2017  . Breast cancer screening 08/13/2017  . Bilateral carpal tunnel syndrome 07/29/2017    Past Surgical History:  Procedure Laterality Date  . CESAREAN SECTION       OB History   No obstetric history on file.     Family History  Problem Relation Age of Onset  . Hypertension Mother   . COPD Mother        Smoker  . Alcohol abuse Father   . Hypertension Sister     Social History   Tobacco Use  . Smoking status: Never  Smoker  . Smokeless tobacco: Never Used  Vaping Use  . Vaping Use: Never used  Substance Use Topics  . Alcohol use: No  . Drug use: No    Home Medications Prior to Admission medications   Medication Sig Start Date End Date Taking? Authorizing Provider  amLODipine (NORVASC) 5 MG tablet TAKE 1 TABLET(5 MG) BY MOUTH DAILY 06/06/18   Everrett CoombeMatthews, Cody, DO  aspirin EC 81 MG tablet Take 1 tablet (81 mg total) by mouth daily. 11/25/17   Everrett CoombeMatthews, Cody, DO  atorvastatin (LIPITOR) 40 MG tablet Take 1 tablet (40 mg total) by mouth daily. 08/25/17   Everrett CoombeMatthews, Cody, DO  Cholecalciferol (VITAMIN D PO) Take 1 tablet by mouth daily.    [provider]  enalapril (VASOTEC) 10 MG tablet TAKE 1 TABLET(10 MG) BY MOUTH DAILY 06/06/18   Everrett CoombeMatthews, Cody, DO  fluticasone (FLONASE) 50 MCG/ACT nasal spray Place 2 sprays into both nostrils daily. 09/24/17   Everrett CoombeMatthews, Cody, DO  glucose blood test strip Test blood glucose twice daily. Contour next strips. DX E11.56 09/24/17   Everrett CoombeMatthews, Cody, DO  hydrochlorothiazide (HYDRODIURIL) 12.5 MG tablet TAKE 2 TABLETS(25 MG) BY MOUTH DAILY 01/12/18   Everrett CoombeMatthews, Cody, DO  Insulin Glargine (LANTUS SOLOSTAR) 100 UNIT/ML Solostar Pen Inject 20 Units into the skin daily at 10 pm. 08/13/17   Everrett CoombeMatthews, Cody, DO  Insulin Pen  Needle (PEN NEEDLES) 30G X 8 MM MISC 1 Units by Does not apply route daily. Use to inject inulin daily 08/13/17   Everrett Coombe, DO  metFORMIN (GLUCOPHAGE-XR) 750 MG 24 hr tablet TAKE 2 TABLETS(1500 MG) BY MOUTH DAILY WITH BREAKFAST 09/07/18   Everrett Coombe, DO  Vitamin E 400 units TABS Take 1 tablet (400 Units total) by mouth daily. 11/25/17   Everrett Coombe, DO    Allergies    Penicillins  Review of Systems   Review of Systems  Unable to perform ROS: Mental status change    Physical Exam Updated Vital Signs BP (!) 130/92   Pulse (!) 109   Temp 100 F (37.8 C) (Rectal)   Resp 18   Ht 5\' 3"  (1.6 m)   Wt 72.7 kg   SpO2 100%   BMI 28.39 kg/m   Physical  Exam Constitutional:      Comments: Severely obtunded, unresponsive to painful stimuli.     ED Results / Procedures / Treatments   Labs (all labs ordered are listed, but only abnormal results are displayed) Labs Reviewed  CBC WITH DIFFERENTIAL/PLATELET - Abnormal; Notable for the following components:      Result Value   WBC 20.0 (*)    RBC 5.40 (*)    Hemoglobin 15.5 (*)    HCT 49.1 (*)    Neutro Abs 18.2 (*)    Abs Immature Granulocytes 0.11 (*)    All other components within normal limits  I-STAT ARTERIAL BLOOD GAS, ED - Abnormal; Notable for the following components:   pH, Arterial 6.992 (*)    pCO2 arterial 103.8 (*)    Acid-base deficit 10.0 (*)    HCT 48.0 (*)    Hemoglobin 16.3 (*)    All other components within normal limits  CULTURE, BLOOD (ROUTINE X 2)  CULTURE, BLOOD (ROUTINE X 2)  SARS CORONAVIRUS 2 (TAT 6-24 HRS)  COMPREHENSIVE METABOLIC PANEL  LACTIC ACID, PLASMA  LACTIC ACID, PLASMA  RAPID URINE DRUG SCREEN, HOSP PERFORMED  BETA-HYDROXYBUTYRIC ACID  BLOOD GAS, ARTERIAL  CBG MONITORING, ED  TROPONIN I (HIGH SENSITIVITY)  TROPONIN I (HIGH SENSITIVITY)    EKG None  Radiology DG Chest Portable 1 View  Result Date: 09/16/2020 CLINICAL DATA:  Intubated EXAM: PORTABLE CHEST 1 VIEW COMPARISON:  Chest radiograph from earlier today. FINDINGS: Endotracheal tube tip is 4.1 cm above the carina. Enteric tube enters stomach with the tip not seen on this image. Stable cardiomediastinal silhouette with normal heart size. No pneumothorax. Stable small right pleural effusion. No left pleural effusion. Increased curvilinear right lung base atelectasis. Stable mild elevation of the right hemidiaphragm. No pulmonary edema. IMPRESSION: 1. Well-positioned endotracheal and enteric tubes. 2. Stable small right pleural effusion. 3. Increased curvilinear right lung base atelectasis. Electronically Signed   By: 11/16/2020 M.D.   On: 09/16/2020 19:04   DG Chest Port 1  View  Result Date: 09/16/2020 CLINICAL DATA:  Altered level of consciousness. EXAM: PORTABLE CHEST 1 VIEW COMPARISON:  None available. FINDINGS: Patient is rotated. Lung volumes are low. Upper normal heart size likely accentuated by technique. Elevation of right hemidiaphragm. Adjacent right basilar opacity. No pulmonary edema. No pneumothorax. No large pleural effusion. No acute osseous abnormalities are seen IMPRESSION: Rotated exam with low lung volumes. Elevation of right hemidiaphragm with adjacent right basilar opacity, probable atelectasis. Electronically Signed   By: 11/16/2020 M.D.   On: 09/16/2020 18:23    Procedures .Critical Care Performed by: 11/16/2020, MD  Authorized by: Cheryll Cockayne, MD   Critical care provider statement:    Critical care time (minutes):  40   Critical care time was exclusive of:  Separately billable procedures and treating other patients   Critical care was necessary to treat or prevent imminent or life-threatening deterioration of the following conditions:  CNS failure or compromise Comments:        Procedure Name: Intubation Date/Time: 09/16/2020 7:04 PM Performed by: Cheryll Cockayne, MD Pre-anesthesia Checklist: Patient identified, Patient being monitored, Emergency Drugs available, Timeout performed and Suction available Oxygen Delivery Method: Non-rebreather mask Preoxygenation: Pre-oxygenation with 100% oxygen Induction Type: Rapid sequence Ventilation: Mask ventilation without difficulty Placement Confirmation: ETT inserted through vocal cords under direct vision,  CO2 detector and Breath sounds checked- equal and bilateral Secured at: 22 cm Tube secured with: ETT holder Comments: Patient intubated successfully on first attempt with glide scope.  Cords visualized and 7.5 ET tube advanced to 22 at the lips.  There is good color change, misting in the tube and present surgical bilaterally.  No desaturations noted during  procedure.   Subsequent chest x-ray shows good placement of the tube.          Medications Ordered in ED Medications  LORazepam (ATIVAN) injection 2 mg (has no administration in time range)  labetalol (NORMODYNE) injection 20 mg (has no administration in time range)  labetalol (NORMODYNE) 5 MG/ML injection (has no administration in time range)  LORazepam (ATIVAN) 2 MG/ML injection (has no administration in time range)  levETIRAcetam (KEPPRA) IVPB 1000 mg/100 mL premix (1,000 mg Intravenous New Bag/Given 09/16/20 1829)    Followed by  levETIRAcetam (KEPPRA) IVPB 1500 mg/ 100 mL premix (has no administration in time range)  propofol (DIPRIVAN) 1000 MG/100ML infusion (has no administration in time range)  midazolam (VERSED) 50 mg/50 mL (1 mg/mL) premix infusion (has no administration in time range)  sodium chloride 0.9 % bolus 1,000 mL (1,000 mLs Intravenous New Bag/Given 09/16/20 1843)  sodium chloride 0.9 % bolus 1,000 mL (1,000 mLs Intravenous New Bag/Given 09/16/20 1843)  LORazepam (ATIVAN) injection 2 mg (2 mg Intravenous Given 09/16/20 1800)    ED Course  I have reviewed the triage vital signs and the nursing notes.  Pertinent labs & imaging results that were available during my care of the patient were reviewed by me and considered in my medical decision making (see chart for details).    MDM Rules/Calculators/A&P                          Dependent upon arrival to the ER.  She did have a swallow reflex intact and was observed for the first moments of arrival.  She subsequently had a repeat seizure again here tonic-clonic given 2 mg of Ativan.  This appeared to break the seizure and then several minutes later she had another seizure again given additional 2 mg of Ativan.  Seizure activity was seen to resolve afterwards.  She never fully woke up in between the seizures, concern for continued status.  Patient subsequently intubated.  Prior to intubation patient reportedly had 2  seizures prior to arrival and on route, in addition to 2 additional seizures back-to-back here in the ER.  There was no return to normal mental status between the seizures.  CT imaging of the brain appears to showing no acute hemorrhage or other acute lesion.  Neurology consulted for further evaluation, admitted to the ICU.  Final Clinical Impression(s) /  ED Diagnoses Final diagnoses:  Status epilepticus [facility])    Rx / DC Orders ED Discharge Orders    None       Cheryll Cockayne, MD 09/16/20 1906    Cheryll Cockayne, MD 09/16/20 Serena Croissant

## 2020-09-16 NOTE — Progress Notes (Signed)
Pt transported to 4n with no complications. 

## 2020-09-16 NOTE — ED Notes (Signed)
Attempted to give report to give reportx1

## 2020-09-16 NOTE — H&P (Signed)
NAME:  Tricia Bridges, MRN:  [id number], DOB:  [birth year], LOS: 0 ADMISSION DATE:  09/16/2020, CONSULTATION DATE:  6/6 REFERRING MD:  Hoing MD, CHIEF COMPLAINT:  Status epilepticus   History of Present Illness:  Tricia Bridges is a 55 y.o. who presented to [facility] on 6/6 with status epilepticus   Spoke with Tricia Bridges (daughter). Tricia Bridges lives at home with her daughter. 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 Per daughter- Last night (6/5) was last known well. Around 1630PM, Tricia found the patient laying on her back with vomit and urine around her. When the daughter approched the patient had witnessed seizure, she was turned on side and called ems was called.  Per daughter- the patient has never had a seizure before. No new medication, does take insulin at home. Does not drink alcohol.   En route, EMS witnessed 2 additional episodes of seizures. 7.5mg  of versed was given. BG per EMS 600 in route. In the ED the patient again seized twice with no return to baseline mental status in between episodes. The patient was intubated in the ED. Work up was notable for CT of head showing no acute process. WBC 20.0, Temp 37.8, ABG 6.9/103/91/25.  Pertinent  Medical History  DM2  Significant Hospital Events: Including procedures, antibiotic start and stop dates in addition to other pertinent events   . 6/6 Multiple seizures, admit, presented to [facility]  Interim History / Subjective:  See above  Versed GTT at 1  Intubated sedated  Unable to obtain subjective evaluation due to patient status  Objective   Blood pressure (!) 134/92, pulse (!) 106, temperature 100 F (37.8 C), temperature source Rectal, resp. rate 18, height 5\' 3"  (1.6 m), weight 72.7 kg, SpO2 100 %.    Vent Mode: PRVC FiO2 (%):  [100 %] 100 % Set Rate:  [18 bmp-24 bmp] 24 bmp Vt Set:  [420 mL-520 mL] 420 mL PEEP:  [5 cmH20] 5 cmH20 Plateau Pressure:  [17 cmH20] 17 cmH20    Intake/Output Summary (Last 24 hours) at 09/16/2020 2004 Last data filed at 09/16/2020 1930 Gross per 24 hour  Intake 100 ml  Output --  Net 100 ml   Filed Weights   09/16/20 1920  Weight: 72.7 kg    Examination: General:  Ill appearing, no acute distress, in bed HEENT: MM pink/moist, anicteric, trachea midline, ETT, OGY  Neuro: MAE,withdraws to noxious stimuli, no eye opening, PERRL 32mm CV: S1S2, ST, no m/r/g appreciated PULM:  Coarse in the upper lobes and in the lower lobes, clear secretions, chest expansion symmetric GI: soft, bsx4 hypoactive, nondistended   Extremities: warm/dry, no pretibial edema, capillary refill less than 3 seconds  Skin: no rashes or lesions appreciated   Labs/imaging that I havepersonally reviewed  (right click and "Reselect all SmartList Selections" daily)  ABG- acute respiratory failure, hypercarbia CMP- NA 137, creatine 1.2, AG 13, LFT WNL Trop high sens- 18 CBC- WBC 20.0, HGB 15.5 Lactate 5.7 CXR- tube in good position, rt atelectasis vs effusion  CT head- No acute intracranial abnormality   Resolved Hospital Problem list   N/A  Assessment & Plan:  Acute Metabolic Encephalopathy- Hypercarbic on ABG  Status Epilepticus Multiple seizures in route with EMS and in [facility] ED. ?Drugs vs HHNK vs metabolic process -Appreciate Neurology assistance. Workup of seizures per neurology. -Start PAD bundle with propofol and fentanyl gtt. Goal RASS of -1 -Keppra given for seizure PPX -Started IV thaimine -Follow up TSH,  ammonia, drug screen, coox panel, ethanol, vitamin b1  -Initiate seizure precautions   Acute Respiratory Failure with Hypercarbia Secondary to status epilepticus. ?aspiration. CXR- tube in good position, rt atelectasis vs effusion. ABG 6.9/103/91/25  -LTVV strategy with tidal volumes of 4-8 cc/kg ideal body weight -Goal plateau pressures of 30 and driving pressures of 15 -Wean PEEP/FiO2 for SpO2 greater than 92 -Repeat ABG in 1  hour. -Follow intermittent CXR and ABG PRN -Starting ceftriaxone 2g q12h -Continue MV overnight  Hyperglycemia- Initial BG 623 HHNK Hx DM2 ?post seizure vs HHNK, AG13   BG 623, Ph 6.9, Bicarb 21, ketones 5, glucosuria Beta-hydrpxybutyric Acid 0.91  -Start adult hyperglycemia crisis protocol -Start insulin gtt -Obtain BMET after fluid bolus  -D50 for hypoglycemia  -BMP in AM.  Lactic Acidosis Lactate 5.7, ? Infection vs status epilepticus. 2L 0.9 NS given in ED. -Repeat lactate and trend  Fever Leukocytosis ?S/P seizing vs aspiration pneumonitis.  Temp on arrival, 100F, neck supple on exam. WBC 20 -Obtain CBC in AM -Trend WBC/fever curve  Best practice (right click and "Reselect all SmartList Selections" daily)  Diet:  NPO Pain/Anxiety/Delirium protocol (if indicated): Yes (RASS goal -1,-2) VAP protocol (if indicated): Yes DVT prophylaxis: Subcutaneous Heparin GI prophylaxis: PPI Glucose control:  Insulin gtt Central venous access:  N/A Arterial line:  N/A Foley:  N/A Mobility:  bed rest  PT consulted: N/A Last date of multidisciplinary goals of care discussion [Pending] Code Status:  full code Disposition: ICU  Labs   CBC: Recent Labs  Lab 09/16/20 1748 09/16/20 1809  WBC 20.0*  --   NEUTROABS 18.2*  --   HGB 15.5* 16.3*  HCT 49.1* 48.0*  MCV 90.9  --   PLT 246  --     Basic Metabolic Panel: Recent Labs  Lab 09/16/20 1748 09/16/20 1809  NA 137 142  K 4.2 4.2  CL 103  --   CO2 21*  --   GLUCOSE 623*  --   BUN 18  --   CREATININE 1.20*  --   CALCIUM 9.2  --    GFR: Estimated Creatinine Clearance: 50 mL/min (A) (by C-G formula based on SCr of 1.2 mg/dL (H)). Recent Labs  Lab 09/16/20 1748  WBC 20.0*  LATICACIDVEN 5.7*    Liver Function Tests: Recent Labs  Lab 09/16/20 1748  AST 32  ALT 29  ALKPHOS 135*  BILITOT 0.5  PROT 7.4  ALBUMIN 3.7   No results for input(s): LIPASE, AMYLASE in the last 168 hours. No results for input(s):  AMMONIA in the last 168 hours.  ABG    Component Value Date/Time   PHART 6.992 (LL) 09/16/2020 1809   PCO2ART 103.8 (HH) 09/16/2020 1809   PO2ART 91 09/16/2020 1809   HCO3 25.1 09/16/2020 1809   TCO2 28 09/16/2020 1809   ACIDBASEDEF 10.0 (H) 09/16/2020 1809   O2SAT 90.0 09/16/2020 1809     Coagulation Profile: No results for input(s): INR, PROTIME in the last 168 hours.  Cardiac Enzymes: No results for input(s): CKTOTAL, CKMB, CKMBINDEX, TROPONINI in the last 168 hours.  HbA1C: Hgb A1c MFr Bld  Date/Time Value Ref Range Status  02/25/2018 10:19 AM 7.3 (H) 4.6 - 6.5 % Final    Comment:    Glycemic Control Guidelines for People with Diabetes:Non Diabetic:  <6%Goal of Therapy: <7%Additional Action Suggested:  >8%   11/25/2017 08:25 AM 7.6 (H) 4.6 - 6.5 % Final    Comment:    Glycemic Control Guidelines for People with  Diabetes:Non Diabetic:  <6%Goal of Therapy: <7%Additional Action Suggested:  >8%     CBG: No results for input(s): GLUCAP in the last 168 hours.  Review of Systems:   Unable to obtain review of systems due to patient condition   Past Medical History:  She,  has a past medical history of Diabetes mellitus without complication (HCC), Hypertension, and Pre-diabetes.   Surgical History:   Past Surgical History:  Procedure Laterality Date  . CESAREAN SECTION       Social History:   reports that she has never smoked. She has never used smokeless tobacco. She reports that she does not drink alcohol and does not use drugs.   Family History:  Her family history includes Alcohol abuse in her father; COPD in her mother; Hypertension in her mother and sister.   Allergies Allergies  Allergen Reactions  . Penicillins Itching    Has patient had a PCN reaction causing immediate rash, facial/tongue/throat swelling, SOB or lightheadedness with hypotension: no Has patient had a PCN reaction causing severe rash involving mucus membranes or skin necrosis: no Has  patient had a PCN reaction that required hospitalization: no Has patient had a PCN reaction occurring within the last 10 years: yes If all of the above answers are "NO", then may proceed with Cephalosporin use.      Home Medications  Prior to Admission medications   Medication Sig Start Date End Date Taking? Authorizing Provider  amLODipine (NORVASC) 5 MG tablet TAKE 1 TABLET(5 MG) BY MOUTH DAILY 06/06/18   Everrett Coombe, DO  aspirin EC 81 MG tablet Take 1 tablet (81 mg total) by mouth daily. 11/25/17   Everrett Coombe, DO  atorvastatin (LIPITOR) 40 MG tablet Take 1 tablet (40 mg total) by mouth daily. 08/25/17   Everrett Coombe, DO  Cholecalciferol (VITAMIN D PO) Take 1 tablet by mouth daily.    [provider]  enalapril (VASOTEC) 10 MG tablet TAKE 1 TABLET(10 MG) BY MOUTH DAILY 06/06/18   Everrett Coombe, DO  fluticasone (FLONASE) 50 MCG/ACT nasal spray Place 2 sprays into both nostrils daily. 09/24/17   Everrett Coombe, DO  glucose blood test strip Test blood glucose twice daily. Contour next strips. DX E11.56 09/24/17   Everrett Coombe, DO  hydrochlorothiazide (HYDRODIURIL) 12.5 MG tablet TAKE 2 TABLETS(25 MG) BY MOUTH DAILY 01/12/18   Everrett Coombe, DO  Insulin Glargine (LANTUS SOLOSTAR) 100 UNIT/ML Solostar Pen Inject 20 Units into the skin daily at 10 pm. 08/13/17   Everrett Coombe, DO  Insulin Pen Needle (PEN NEEDLES) 30G X 8 MM MISC 1 Units by Does not apply route daily. Use to inject inulin daily 08/13/17   Everrett Coombe, DO  metFORMIN (GLUCOPHAGE-XR) 750 MG 24 hr tablet TAKE 2 TABLETS(1500 MG) BY MOUTH DAILY WITH BREAKFAST 09/07/18   Everrett Coombe, DO  Vitamin E 400 units TABS Take 1 tablet (400 Units total) by mouth daily. 11/25/17   Everrett Coombe, DO     Critical care time: 6 minutes      Gershon Mussel., MSN, APRN, AGACNP-BC [facility]  09/16/2020 , 8:04 PM  Please see [email or web address] for pager details  If no response, please call  [phone] After hours, please call Elink at [phone]

## 2020-09-17 ENCOUNTER — Inpatient Hospital Stay (HOSPITAL_COMMUNITY): Payer: Self-pay

## 2020-09-17 ENCOUNTER — Inpatient Hospital Stay: Payer: Self-pay

## 2020-09-17 DIAGNOSIS — E1101 Type 2 diabetes mellitus with hyperosmolarity with coma: Secondary | ICD-10-CM

## 2020-09-17 DIAGNOSIS — M6282 Rhabdomyolysis: Secondary | ICD-10-CM

## 2020-09-17 LAB — GLUCOSE, CAPILLARY
Glucose-Capillary: 130 mg/dL — ABNORMAL HIGH (ref 70–99)
Glucose-Capillary: 149 mg/dL — ABNORMAL HIGH (ref 70–99)
Glucose-Capillary: 159 mg/dL — ABNORMAL HIGH (ref 70–99)
Glucose-Capillary: 165 mg/dL — ABNORMAL HIGH (ref 70–99)
Glucose-Capillary: 177 mg/dL — ABNORMAL HIGH (ref 70–99)
Glucose-Capillary: 178 mg/dL — ABNORMAL HIGH (ref 70–99)
Glucose-Capillary: 180 mg/dL — ABNORMAL HIGH (ref 70–99)
Glucose-Capillary: 183 mg/dL — ABNORMAL HIGH (ref 70–99)
Glucose-Capillary: 193 mg/dL — ABNORMAL HIGH (ref 70–99)
Glucose-Capillary: 196 mg/dL — ABNORMAL HIGH (ref 70–99)
Glucose-Capillary: 209 mg/dL — ABNORMAL HIGH (ref 70–99)
Glucose-Capillary: 254 mg/dL — ABNORMAL HIGH (ref 70–99)
Glucose-Capillary: 294 mg/dL — ABNORMAL HIGH (ref 70–99)
Glucose-Capillary: 314 mg/dL — ABNORMAL HIGH (ref 70–99)
Glucose-Capillary: 340 mg/dL — ABNORMAL HIGH (ref 70–99)

## 2020-09-17 LAB — POCT I-STAT 7, (LYTES, BLD GAS, ICA,H+H)
Acid-Base Excess: 2 mmol/L (ref 0.0–2.0)
Acid-Base Excess: 0 mmol/L (ref 0.0–2.0)
Bicarbonate: 22.8 mmol/L (ref 20.0–28.0)
Bicarbonate: 21.7 mmol/L (ref 20.0–28.0)
Calcium, Ion: 1.21 mmol/L (ref 1.15–1.40)
Calcium, Ion: 1.2 mmol/L (ref 1.15–1.40)
HCT: 42 % (ref 36.0–46.0)
HCT: 39 % (ref 36.0–46.0)
Hemoglobin: 14.3 g/dL (ref 12.0–15.0)
Hemoglobin: 13.3 g/dL (ref 12.0–15.0)
O2 Saturation: 99 %
O2 Saturation: 99 %
Patient temperature: 98
Patient temperature: 98.6
Potassium: 3.2 mmol/L — ABNORMAL LOW (ref 3.5–5.1)
Potassium: 3.3 mmol/L — ABNORMAL LOW (ref 3.5–5.1)
Sodium: 147 mmol/L — ABNORMAL HIGH (ref 135–145)
Sodium: 143 mmol/L (ref 135–145)
TCO2: 24 mmol/L (ref 22–32)
TCO2: 23 mmol/L (ref 22–32)
pCO2 arterial: 25.5 mmHg — ABNORMAL LOW (ref 32.0–48.0)
pCO2 arterial: 28.1 mmHg — ABNORMAL LOW (ref 32.0–48.0)
pH, Arterial: 7.559 — ABNORMAL HIGH (ref 7.350–7.450)
pH, Arterial: 7.497 — ABNORMAL HIGH (ref 7.350–7.450)
pO2, Arterial: 107 mmHg (ref 83.0–108.0)
pO2, Arterial: 137 mmHg — ABNORMAL HIGH (ref 83.0–108.0)

## 2020-09-17 LAB — BASIC METABOLIC PANEL
Anion gap: 13 (ref 5–15)
Anion gap: 14 (ref 5–15)
Anion gap: 15 (ref 5–15)
Anion gap: 5 (ref 5–15)
Anion gap: 5 (ref 5–15)
Anion gap: 6 (ref 5–15)
BUN: 10 mg/dL (ref 6–20)
BUN: 13 mg/dL (ref 6–20)
BUN: 13 mg/dL (ref 6–20)
BUN: 15 mg/dL (ref 6–20)
BUN: 8 mg/dL (ref 6–20)
BUN: 9 mg/dL (ref 6–20)
CO2: 17 mmol/L — ABNORMAL LOW (ref 22–32)
CO2: 19 mmol/L — ABNORMAL LOW (ref 22–32)
CO2: 20 mmol/L — ABNORMAL LOW (ref 22–32)
CO2: 22 mmol/L (ref 22–32)
CO2: 23 mmol/L (ref 22–32)
CO2: 23 mmol/L (ref 22–32)
Calcium: 10.6 mg/dL — ABNORMAL HIGH (ref 8.9–10.3)
Calcium: 6.6 mg/dL — ABNORMAL LOW (ref 8.9–10.3)
Calcium: 7.3 mg/dL — ABNORMAL LOW (ref 8.9–10.3)
Calcium: 9.4 mg/dL (ref 8.9–10.3)
Calcium: 9.5 mg/dL (ref 8.9–10.3)
Calcium: 9.6 mg/dL (ref 8.9–10.3)
Chloride: 108 mmol/L (ref 98–111)
Chloride: 109 mmol/L (ref 98–111)
Chloride: 110 mmol/L (ref 98–111)
Chloride: 110 mmol/L (ref 98–111)
Chloride: 117 mmol/L — ABNORMAL HIGH (ref 98–111)
Chloride: 119 mmol/L — ABNORMAL HIGH (ref 98–111)
Creatinine, Ser: 0.67 mg/dL (ref 0.44–1.00)
Creatinine, Ser: 0.72 mg/dL (ref 0.44–1.00)
Creatinine, Ser: 0.76 mg/dL (ref 0.44–1.00)
Creatinine, Ser: 0.91 mg/dL (ref 0.44–1.00)
Creatinine, Ser: 0.93 mg/dL (ref 0.44–1.00)
Creatinine, Ser: 1.02 mg/dL — ABNORMAL HIGH (ref 0.44–1.00)
GFR, Estimated: >60 mL/min (ref >60)
GFR, Estimated: >60 mL/min (ref >60)
GFR, Estimated: >60 mL/min (ref >60)
GFR, Estimated: >60 mL/min (ref >60)
GFR, Estimated: >60 mL/min (ref >60)
GFR, Estimated: >60 mL/min (ref >60)
Glucose, Bld: 172 mg/dL — ABNORMAL HIGH (ref 70–99)
Glucose, Bld: 186 mg/dL — ABNORMAL HIGH (ref 70–99)
Glucose, Bld: 195 mg/dL — ABNORMAL HIGH (ref 70–99)
Glucose, Bld: 209 mg/dL — ABNORMAL HIGH (ref 70–99)
Glucose, Bld: 238 mg/dL — ABNORMAL HIGH (ref 70–99)
Glucose, Bld: 342 mg/dL — ABNORMAL HIGH (ref 70–99)
Potassium: 2.4 mmol/L — CL (ref 3.5–5.1)
Potassium: 3.3 mmol/L — ABNORMAL LOW (ref 3.5–5.1)
Potassium: 3.5 mmol/L (ref 3.5–5.1)
Potassium: 3.9 mmol/L (ref 3.5–5.1)
Potassium: 4.4 mmol/L (ref 3.5–5.1)
Potassium: 5.2 mmol/L — ABNORMAL HIGH (ref 3.5–5.1)
Sodium: 139 mmol/L (ref 135–145)
Sodium: 141 mmol/L (ref 135–145)
Sodium: 141 mmol/L (ref 135–145)
Sodium: 143 mmol/L (ref 135–145)
Sodium: 145 mmol/L (ref 135–145)
Sodium: 146 mmol/L — ABNORMAL HIGH (ref 135–145)

## 2020-09-17 LAB — VITAMIN D 25 HYDROXY (VIT D DEFICIENCY, FRACTURES): Vit D, 25-Hydroxy: 34.37 ng/mL (ref 30–100)

## 2020-09-17 LAB — SARS CORONAVIRUS 2 (TAT 6-24 HRS): SARS Coronavirus 2: NEGATIVE

## 2020-09-17 LAB — CBC
HCT: 47.1 % — ABNORMAL HIGH (ref 36.0–46.0)
Hemoglobin: 16 g/dL — ABNORMAL HIGH (ref 12.0–15.0)
MCH: 29.3 pg (ref 26.0–34.0)
MCHC: 34 g/dL (ref 30.0–36.0)
MCV: 86.1 fL (ref 80.0–100.0)
Platelets: 204 10*3/uL (ref 150–400)
RBC: 5.47 MIL/uL — ABNORMAL HIGH (ref 3.87–5.11)
RDW: 12.5 % (ref 11.5–15.5)
WBC: 18.9 10*3/uL — ABNORMAL HIGH (ref 4.0–10.5)
nRBC: 0 % (ref 0.0–0.2)

## 2020-09-17 LAB — OSMOLALITY: Osmolality: 324 mOsm/kg (ref 275–295)

## 2020-09-17 LAB — CK
Total CK: 1182 U/L — ABNORMAL HIGH (ref 38–234)
Total CK: 2182 U/L — ABNORMAL HIGH (ref 38–234)

## 2020-09-17 LAB — MRSA PCR SCREENING: MRSA by PCR: NEGATIVE

## 2020-09-17 LAB — MAGNESIUM: Magnesium: 2.3 mg/dL (ref 1.7–2.4)

## 2020-09-17 LAB — PHOSPHORUS: Phosphorus: 2.3 mg/dL — ABNORMAL LOW (ref 2.5–4.6)

## 2020-09-17 LAB — TROPONIN I (HIGH SENSITIVITY)
Troponin I (High Sensitivity): 49 ng/L — ABNORMAL HIGH (ref <18)
Troponin I (High Sensitivity): 63 ng/L — ABNORMAL HIGH (ref <18)

## 2020-09-17 LAB — AMMONIA: Ammonia: 27 umol/L (ref 9–35)

## 2020-09-17 LAB — HIV ANTIBODY (ROUTINE TESTING W REFLEX): HIV Screen 4th Generation wRfx: NONREACTIVE

## 2020-09-17 LAB — LACTIC ACID, PLASMA
Lactic Acid, Venous: 1 mmol/L (ref 0.5–1.9)
Lactic Acid, Venous: 4.5 mmol/L (ref 0.5–1.9)

## 2020-09-17 LAB — BETA-HYDROXYBUTYRIC ACID: Beta-Hydroxybutyric Acid: 1.23 mmol/L — ABNORMAL HIGH (ref 0.05–0.27)

## 2020-09-17 LAB — TRIGLYCERIDES: Triglycerides: 116 mg/dL (ref <150)

## 2020-09-17 LAB — ETHANOL: Alcohol, Ethyl (B): <10 mg/dL (ref <10)

## 2020-09-17 MED ORDER — LEVETIRACETAM IN NACL 500 MG/100ML IV SOLN
500.0000 mg | Freq: Two times a day (BID) | INTRAVENOUS | Status: DC
  Administered 2020-09-17 – 2020-09-20 (×6): 500 mg via INTRAVENOUS
  Filled 2020-09-17 (×6): qty 100

## 2020-09-17 MED ORDER — LACTATED RINGERS IV BOLUS: 2000.0000 mL | Freq: Once | INTRAVENOUS | Status: DC

## 2020-09-17 MED ORDER — INSULIN ASPART 100 UNIT/ML IJ SOLN
0.0000 [IU] | INTRAMUSCULAR | Status: DC
  Administered 2020-09-17: 3 [IU] via SUBCUTANEOUS
  Administered 2020-09-17 – 2020-09-18 (×2): 5 [IU] via SUBCUTANEOUS
  Administered 2020-09-18: 2 [IU] via SUBCUTANEOUS
  Administered 2020-09-18: 3 [IU] via SUBCUTANEOUS
  Administered 2020-09-19: 2 [IU] via SUBCUTANEOUS

## 2020-09-17 MED ORDER — CALCIUM GLUCONATE-NACL 1-0.675 GM/50ML-% IV SOLN
1.0000 g | Freq: Once | INTRAVENOUS | Status: AC
  Administered 2020-09-17: 1000 mg via INTRAVENOUS
  Filled 2020-09-17: qty 50

## 2020-09-17 MED ORDER — LACTATED RINGERS IV BOLUS
1000.0000 mL | Freq: Once | INTRAVENOUS | Status: AC
  Administered 2020-09-17: 1000 mL via INTRAVENOUS

## 2020-09-17 MED ORDER — LORAZEPAM 2 MG/ML IJ SOLN
2.0000 mg | Freq: Four times a day (QID) | INTRAMUSCULAR | Status: DC | PRN
  Administered 2020-09-20: 2 mg via INTRAVENOUS
  Filled 2020-09-17 (×2): qty 1

## 2020-09-17 MED ORDER — POTASSIUM CHLORIDE 20 MEQ PO PACK
40.0000 meq | PACK | Freq: Three times a day (TID) | ORAL | Status: DC
  Filled 2020-09-17: qty 2

## 2020-09-17 MED ORDER — INSULIN GLARGINE 100 UNIT/ML ~~LOC~~ SOLN
12.0000 [IU] | Freq: Two times a day (BID) | SUBCUTANEOUS | Status: DC
  Administered 2020-09-17 – 2020-09-18 (×2): 12 [IU] via SUBCUTANEOUS
  Filled 2020-09-17 (×3): qty 0.12

## 2020-09-17 MED ORDER — POTASSIUM & SODIUM PHOSPHATES 280-160-250 MG PO PACK
2.0000 | PACK | ORAL | Status: AC
  Administered 2020-09-17 (×2): 2
  Filled 2020-09-17: qty 2
  Filled 2020-09-17 (×2): qty 1

## 2020-09-17 MED ORDER — SODIUM CHLORIDE 0.9% FLUSH
10.0000 mL | INTRAVENOUS | Status: DC | PRN
  Administered 2020-09-20: 10 mL

## 2020-09-17 MED ORDER — SODIUM CHLORIDE 0.9% FLUSH
10.0000 mL | Freq: Two times a day (BID) | INTRAVENOUS | Status: DC
  Administered 2020-09-17: 20 mL
  Administered 2020-09-18: 10 mL
  Administered 2020-09-18: 30 mL
  Administered 2020-09-19 – 2020-09-21 (×5): 10 mL
  Administered 2020-09-22: 20 mL

## 2020-09-17 MED ORDER — INSULIN GLARGINE 100 UNIT/ML ~~LOC~~ SOLN
12.0000 [IU] | Freq: Every day | SUBCUTANEOUS | Status: DC
  Administered 2020-09-17: 12 [IU] via SUBCUTANEOUS
  Filled 2020-09-17: qty 0.12

## 2020-09-17 MED ORDER — DEXTROSE-NACL 5-0.45 % IV SOLN: INTRAVENOUS | Status: DC

## 2020-09-17 MED ORDER — INSULIN ASPART 100 UNIT/ML IJ SOLN
0.0000 [IU] | INTRAMUSCULAR | Status: DC
  Administered 2020-09-17: 5 [IU] via SUBCUTANEOUS

## 2020-09-17 MED ORDER — SODIUM CHLORIDE 0.9 % IV SOLN
2.0000 g | INTRAVENOUS | Status: AC
  Administered 2020-09-18 – 2020-09-21 (×4): 2 g via INTRAVENOUS
  Filled 2020-09-17: qty 20
  Filled 2020-09-17: qty 2
  Filled 2020-09-17: qty 20
  Filled 2020-09-17: qty 2

## 2020-09-17 MED ORDER — POTASSIUM CHLORIDE 10 MEQ/50ML IV SOLN
10.0000 meq | INTRAVENOUS | Status: AC
  Administered 2020-09-17 (×4): 10 meq via INTRAVENOUS
  Filled 2020-09-17 (×3): qty 50

## 2020-09-17 MED ORDER — POLYETHYLENE GLYCOL 3350 17 G PO PACK: 17.0000 g | PACK | Freq: Every day | ORAL | Status: DC | PRN

## 2020-09-17 NOTE — Progress Notes (Signed)
CRITICAL VALUE STICKER  CRITICAL VALUE: K 2.4  RECEIVER (on-site recipient of call): Renold Don, RN  DATE & TIME NOTIFIED: 09/17/20 1627  MD NOTIFIED: Joneen Roach, NP  TIME OF NOTIFICATION: 1635  RESPONSE: Will order labs and replace potassium. See MAR.

## 2020-09-17 NOTE — Progress Notes (Signed)
Peripherally Inserted Central Catheter Placement  The IV Nurse has discussed with the patient and/or persons authorized to consent for the patient, the purpose of this procedure and the potential benefits and risks involved with this procedure.  The benefits include less needle sticks, lab draws from the catheter, and the patient may be discharged home with the catheter. Risks include, but not limited to, infection, bleeding, blood clot (thrombus formation), and puncture of an artery; nerve damage and irregular heartbeat and possibility to perform a PICC exchange if needed/ordered by physician.  Alternatives to this procedure were also discussed.  Bard Power PICC patient education guide, fact sheet on infection prevention and patient information card has been provided to patient /or left at bedside.   Consent obtained with sister at bedside  PICC Placement Documentation  PICC Triple Lumen 09/17/20 PICC Right Brachial 42 cm 0 cm (Active)  Indication for Insertion or Continuance of Line Poor Vasculature-patient has had multiple peripheral attempts or PIVs lasting less than 24 hours 09/17/20 1300  Exposed Catheter (cm) 0 cm 09/17/20 1300  Site Assessment Clean;Dry;Intact 09/17/20 1300  Lumen #1 Status Flushed;Saline locked;Blood return noted 09/17/20 1300  Lumen #2 Status Flushed;Saline locked;Blood return noted 09/17/20 1300  Lumen #3 Status Flushed;Saline locked;Blood return noted 09/17/20 1300  Dressing Type Transparent;Securing device 09/17/20 1300  Dressing Status Clean;Dry;Intact 09/17/20 1300  Antimicrobial disc in place? Yes 09/17/20 1300  Safety Lock Not Applicable 09/17/20 1300  Line Care Connections checked and tightened 09/17/20 1300  Dressing Intervention New dressing 09/17/20 1300  Dressing Change Due 09/24/20 09/17/20 1300       Franne Grip Renee 09/17/2020, 1:24 PM

## 2020-09-17 NOTE — Procedures (Addendum)
Patient Name: Tricia Bridges  MRN: [id number]  Epilepsy Attending: Charlsie Quest  Referring Physician/Provider: Dr Erick Blinks Duration: 09/17/2020 0053 to 09/18/2020 0053  Patient history: 55 y.o.femalewith PMH significant for diabetes, HTN who was found down and had several seizures with clustering. EEG to evaluate for seizure.  Level of alertness: asleep/sedated  AEDs during EEG study: Propofol, Versed, LEV  Technical aspects: This EEG study was done with scalp electrodes positioned according to the 10-20 International system of electrode placement. Electrical activity was acquired at a sampling rate of 500Hz  and reviewed with a high frequency filter of 70Hz  and a low frequency filter of 1Hz . EEG data were recorded continuously and digitally stored.   Description: EEG initially showed burst suppression pattern with generalized, maximal frontal bursts of sharply contoured 15-18Hz  beta activity lasting 2-3 seconds alternating with 3-5 seconds of generalized eeg suppression. Gradually after sedation was weaned off, the duration of EEG suppression became shorter and eventually EEG showed continuous generalized 2 to 5 Hz theta-delta slowing.  ABNORMALITY -Burst suppression, generalized -Continuous slow, generalized   IMPRESSION: This study was initially suggestive of profound diffuse encephalopathy, nonspecific etiology but likely related to sedation.   Gradually as sedation was weaned, EEG was suggestive of moderate to severe diffuse encephalopathy.  No seizures or epileptiform discharges were seen throughout the recording.  Aymee Fomby 

## 2020-09-17 NOTE — Progress Notes (Signed)
EEG maintenance performed.  No skin breakdown observed at electrode sites C4, P8, F8, F7.

## 2020-09-17 NOTE — Progress Notes (Signed)
EEG complete - results pending 

## 2020-09-17 NOTE — Progress Notes (Signed)
LTM EEG hooked up and running - no initial skin breakdown - push button tested - neuro notified. [facility] monitoring.  

## 2020-09-17 NOTE — Progress Notes (Addendum)
Subjective: No further seizures overnight.  ROS: Unable to obtain due to poor mental status  Examination  Vital signs in last 24 hours: Temp:  [98.7 F (37.1 C)-103.2 F (39.6 C)] 98.7 F (37.1 C) (06/07 1200) Pulse Rate:  [98-137] 105 (06/07 1514) Resp:  [16-30] 24 (06/07 1514) BP: (96-197)/(79-175) 147/93 (06/07 1514) SpO2:  [75 %-100 %] 100 % (06/07 1515) FiO2 (%):  [40 %-100 %] 40 % (06/07 1515) Weight:  [72.7 kg] 72.7 kg (06/06 1920)  General: lying in bed, not in apparent distress CVS: pulse-normal rate and rhythm RS: Intubated, coarse breath sounds bilaterally Extremities: normal, warm Neuro: Sedation was just turned off at the time of my exam, patient did not open eyes to noxious stimulation, did not follow commands, PERRLA, no gaze deviation, corneal reflex intact, no definite facial asymmetry, withdraws to noxious stimuli with antigravity strength in upper extremities   Basic Metabolic Panel: Recent Labs  Lab 09/16/20 1748 09/16/20 1809 09/16/20 2130 09/16/20 2336 09/17/20 0336 09/17/20 0932  NA 137 142 144 143 145 146*  K 4.2 4.2 4.3 4.4 3.9 3.3*  CL 103  --   --  110 109 108  CO2 21*  --   --  20* 22 23  GLUCOSE 623*  --   --  342* 195* 186*  BUN 18  --   --  15 13 13   CREATININE 1.20*  --   --  1.02* 0.91 0.93  CALCIUM 9.2  --   --  9.6 9.5 9.4  MG  --   --   --   --  2.3  --   PHOS  --   --   --   --  2.3*  --     CBC: Recent Labs  Lab 09/16/20 1748 09/16/20 1809 09/16/20 2130 09/17/20 0336  WBC 20.0*  --   --  18.9*  NEUTROABS 18.2*  --   --   --   HGB 15.5* 16.3* 15.6* 16.0*  HCT 49.1* 48.0* 46.0 47.1*  MCV 90.9  --   --  86.1  PLT 246  --   --  204     Coagulation Studies: No results for input(s): LABPROT, INR in the last 72 hours.  Imaging CT head without contrast 09/16/2020: No acute abnormality.  ASSESSMENT AND PLAN: 55 year old female who presented with status epilepticus in the setting nonketotic hyperglycemia.  Status  epilepticus (resolved) Acute encephalopathy, likely postictal, toxic-metabolic -Likely provoked status epilepticus in the setting of hyperglycemia  Recommendations -LTM EEG did not show any seizures overnight.  We will plan to wean propofol at 10 MCG per hour to stop -Recommend Keppra 500 mg twice daily for maintenance -We will plan to discontinue LTM EEG tomorrow if seizure-free overnight after turning off sedation -We will obtain MRI brain without contrast after discontinuing LTM EEG to look for any acute abnormality - Continue seizure precautions - As needed IV Ativan 2 mg for clinical seizure-like activity - Management of rest of comorbidities per primary team  CRITICAL CARE Performed by: 57   Total critical care time:35 minutes  Critical care time was exclusive of separately billable procedures and treating other patients.  Critical care was necessary to treat or prevent imminent or life-threatening deterioration.  Critical care was time spent personally by me on the following activities: development of treatment plan with patient and/or surrogate as well as nursing, discussions with consultants, evaluation of patient's response to treatment, examination of patient, obtaining history from patient  or surrogate, ordering and performing treatments and interventions, ordering and review of laboratory studies, ordering and review of radiographic studies, pulse oximetry and re-evaluation of patient's condition.   Zeb Comfort Epilepsy [facility] For questions after 5pm please refer to AMION to reach the Neurologist on call

## 2020-09-17 NOTE — Progress Notes (Signed)
Inpatient Diabetes Program Recommendations  AACE/ADA: New Consensus Statement on Inpatient Glycemic Control  Target Ranges:  Prepandial:   less than 140 mg/dL      Peak postprandial:   less than 180 mg/dL (1-2 hours)      Critically ill patients:  140 - 180 mg/dL   Results for LATESHIA, SCHMOKER (MRN [id number]) as of 09/17/2020 07:32  Ref. Range 09/17/2020 00:23 09/17/2020 01:23 09/17/2020 02:22 09/17/2020 03:46 09/17/2020 04:48 09/17/2020 05:39 09/17/2020 06:45  Glucose-Capillary Latest Ref Range: 70 - 99 mg/dL 959 (H) 747 (H) 185 (H) 177 (H) 149 (H) 130 (H) 193 (H)  Results for RAYYAN, ORSBORN (MRN [id number]) as of 09/17/2020 07:32  Ref. Range 09/16/2020 17:48  Glucose Latest Ref Range: 70 - 99 mg/dL 749 (HH)   Review of Glycemic Control  Diabetes history: DM2 Outpatient Diabetes medications: Lantus 20 units QHS, Metformin XR 1500 mg daily Current orders for Inpatient glycemic control: IV insulin  Inpatient Diabetes Program Recommendations:    Insulin: Once provider is ready to transition from IV to SQ insulin, please consider ordering Lantus 12 units Q24H, CBGs Q4H, and Novolog 0-9 units Q4H.  NOTE: Noted consult for diabetes coordinator. Per chart, patient was admitted with new onset seizures and initial glucose 623 mg/dl on 06/16/50. Patient is ordered IV insulin and most current glucose 193 mg/dl. In reviewing chart noted patient has no insurance listed and no PCP. Noted patient had office visit with DR. Matthews on 02/25/2018 and no further visits noted since then. Patient is currently on ventilator. Will plan to follow up with patient when appropriate.  Thanks, Orlando Penner, RN, MSN, CDE Diabetes Coordinator Inpatient Diabetes Program [phone] (Team Pager from 8am to 5pm)

## 2020-09-17 NOTE — Procedures (Addendum)
Patient Name: Tricia Bridges  MRN: [id number]  Epilepsy Attending: Charlsie Quest  Referring Physician/Provider: Dr Erick Blinks Date: 09/16/2020 Duration: 22.41 mins  Patient history: 55 y.o. female with PMH significant for diabetes, HTN who was found down and had several seizures with clustering. EEG to evaluate for seizure.  Level of alertness:  comatose  AEDs during EEG study: Propofol, Versed  Technical aspects: This EEG study was done with scalp electrodes positioned according to the 10-20 International system of electrode placement. Electrical activity was acquired at a sampling rate of 500Hz  and reviewed with a high frequency filter of 70Hz  and a low frequency filter of 1Hz . EEG data were recorded continuously and digitally stored.   Description: EEG showed burst suppression pattern with generalized, maximal frontal bursts of sharply contoured 15-18Hz  beta activity lasting 2-3 seconds alternating with 10-12 seconds of generalized eeg suppression.  Hyperventilation and photic stimulation were not performed.   ABNORMALITY -Burst suppression, generalized   IMPRESSION: This study is suggestive of profound diffuse encephalopathy, nonspecific etiology but likely related to sedation. No seizures or epileptiform discharges were seen throughout the recording.  Dr was notified.  Serene Kopf 

## 2020-09-17 NOTE — Progress Notes (Addendum)
eLink Physician-Brief Progress Note Patient Name: Tricia Bridges DOB: [birth year] MRN: [id number]   Date of Service  09/17/2020  HPI/Events of Note  HHNK. BG < 200. AG normal. co2 ok. Unable to follow BMP/labs. Not able to stick/get blood. Asking for PICC line also. Not on pressors.   eICU Interventions  discussed. hard stick for labs. not able to get. PICC ordered  DC ed LR, changed to D5 half NS at 100 ml/hr.      Intervention Category Intermediate Interventions: Other:;Hyperglycemia - evaluation and treatment  Ranee Gosselin 09/17/2020, 3:30 AM   4:31 Troponin up to 49.   3rd troponin lab ordered for 7 AM.  If increasing further consider cardiology to see.

## 2020-09-17 NOTE — Progress Notes (Signed)
eLink Physician-Brief Progress Note Patient Name: Tricia Bridges DOB: 28-Jan-1966 MRN: [id number]   Date of Service  09/17/2020  HPI/Events of Note  Repeat K+ 5.2. Patient needs CBG and SSI coverage orders.  eICU Interventions  CBG and SSI orders entered.        [name] 09/17/2020, 8:56 PM

## 2020-09-17 NOTE — Progress Notes (Signed)
NAME:  Tricia Bridges, MRN:  [id number], DOB:  [birth year], LOS: 1 ADMISSION DATE:  09/16/2020, CONSULTATION DATE:  6/6 REFERRING MD:  Hoing MD, CHIEF COMPLAINT:  Status epilepticus   History of Present Illness:  Tricia Bridges is a 55 y.o. who presented to [facility] on 6/6 with status epilepticus   Spoke with [name] (daughter). Eliyanah lives at home with her daughter. 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 Per daughter- Last night (6/5) was last known well. Around 1630PM, Naturi found the patient laying on her back with vomit and urine around her. When the daughter approched the patient had witnessed seizure, she was turned on side and called ems was called.  Per daughter- the patient has never had a seizure before. No new medication, does take insulin at home. Does not drink alcohol.   En route, EMS witnessed 2 additional episodes of seizures. 7.5mg  of versed was given. BG per EMS 600 in route. In the ED the patient again seized twice with no return to baseline mental status in between episodes. The patient was intubated in the ED. Work up was notable for CT of head showing no acute process. WBC 20.0, Temp 37.8, ABG 6.9/103/91/25.  Pertinent  Medical History  DM2  Significant Hospital Events: Including procedures, antibiotic start and stop dates in addition to other pertinent events   . 6/6 Multiple seizures, admit, presented to [facility]  Interim History / Subjective:  No acute events overnight. EEG with burst suppression pattern.   Objective   Blood pressure 123/84, pulse (!) 104, temperature 98.8 F (37.1 C), temperature source Axillary, resp. rate (!) 24, height 5\' 3"  (1.6 m), weight 72.7 kg, SpO2 99 %.    Vent Mode: PRVC FiO2 (%):  [40 %-100 %] 40 % Set Rate:  [18 bmp-24 bmp] 24 bmp Vt Set:  [420 mL-520 mL] 420 mL PEEP:  [5 cmH20] 5 cmH20 Plateau Pressure:  [11 cmH20-17 cmH20] 17 cmH20   Intake/Output Summary (Last 24 hours) at  09/17/2020 0854 Last data filed at 09/17/2020 11/17/2020 Gross per 24 hour  Intake 2734.81 ml  Output 1901 ml  Net 833.81 ml   Filed Weights   09/16/20 1920  Weight: 72.7 kg    Examination:  General:  Middle aged female of normal body habitus Neuro:  Sedated HEENT:  [place]/AT, No JVD noted, pinpoint pupils  Cardiovascular:  RRR, no MRG Lungs:  Clear, no distress.  Abdomen:  Soft, non-distended Musculoskeletal:  No acute deformity Skin:  Intact, MMM   Labs/imaging that I havepersonally reviewed  (right click and "Reselect all SmartList Selections" daily)   CBG 165 Na 145 CK 2182 WBC 18 Lactic acid 4.5 downtrending. Phos 2.3  EEG burst suppression pattern without evidence of seizure on the study.   Resolved Hospital Problem list   N/A  Assessment & Plan:   Status Epilepticus: no sz history. Severe hyperglycemia.  Acute Metabolic Encephalopathy Multiple seizures in route with EMS and in [facility] ED. HHNK vs metabolic process. UDS negative.  Fevers initially now trending down -Appreciate Neurology assistance.  -Propofol and fentanyl gtt. Goal RASS of -1 -Await neurology OK prior to weaning sedation -AED per neuro -IV thaimine -vitamin b1 lab pending  Acute Respiratory Failure with Hypercarbia Secondary to status epilepticus. ?aspiration. CXR- tube in good position, rt atelectasis vs effusion. ABG 6.9/103/91/25  -Full vent support -Contiue ceftriaxone 2g q12h. Stop date added for 5 days total -VAP bundle -Wean vent once sedation can be  weaned.   Hyperglycemia- Initial BG 623 HHNK Hx DM2 -Insulin infusion stop -Transition to lantus 12units daily per DM coordinator recs.  -Trend BP -A1c pending  AKI: dehydration, rhabdo Hypophos - IVF hydration - Give K phos - Trend BMP, CK  Lactic Acidosis: likely sz related. Downtrending.  - monitor clinically.   Fever Leukocytosis ?S/P seizing vs aspiration pneumonitis -Trend WBC/fever curve - Repeat CXR 6/8  Best practice  (right click and "Reselect all SmartList Selections" daily)  Diet:  NPO Pain/Anxiety/Delirium protocol (if indicated): Yes (RASS goal -1,-2) VAP protocol (if indicated): Yes DVT prophylaxis: Subcutaneous Heparin GI prophylaxis: PPI Glucose control:  Insulin gtt Central venous access:  N/A Arterial line:  N/A Foley:  N/A Mobility:  bed rest  PT consulted: N/A Last date of multidisciplinary goals of care discussion [Pending] Daughters updated 6/7 Code Status:  full code Disposition: ICU   Critical care time: 42 minutes     Joneen Roach, AGACNP-BC [facility]  See Amion for personal pager PCCM on call pager [phone] until 7pm. Please call Elink 7p-7a. [phone]  09/17/2020 9:44 AM

## 2020-09-18 ENCOUNTER — Inpatient Hospital Stay (HOSPITAL_COMMUNITY): Payer: Self-pay

## 2020-09-18 DIAGNOSIS — J9601 Acute respiratory failure with hypoxia: Secondary | ICD-10-CM

## 2020-09-18 DIAGNOSIS — J9602 Acute respiratory failure with hypercapnia: Secondary | ICD-10-CM

## 2020-09-18 LAB — POCT I-STAT 7, (LYTES, BLD GAS, ICA,H+H)
Acid-Base Excess: 1 mmol/L (ref 0.0–2.0)
Bicarbonate: 24.8 mmol/L (ref 20.0–28.0)
Calcium, Ion: 1.23 mmol/L (ref 1.15–1.40)
HCT: 42 % (ref 36.0–46.0)
Hemoglobin: 14.3 g/dL (ref 12.0–15.0)
O2 Saturation: 99 %
Patient temperature: 99.5
Potassium: 3.2 mmol/L — ABNORMAL LOW (ref 3.5–5.1)
Sodium: 141 mmol/L (ref 135–145)
TCO2: 26 mmol/L (ref 22–32)
pCO2 arterial: 35.6 mmHg (ref 32.0–48.0)
pH, Arterial: 7.452 — ABNORMAL HIGH (ref 7.350–7.450)
pO2, Arterial: 137 mmHg — ABNORMAL HIGH (ref 83.0–108.0)

## 2020-09-18 LAB — BASIC METABOLIC PANEL
Anion gap: 4 — ABNORMAL LOW (ref 5–15)
Anion gap: 11 (ref 5–15)
BUN: 5 mg/dL — ABNORMAL LOW (ref 6–20)
BUN: 6 mg/dL (ref 6–20)
CO2: 15 mmol/L — ABNORMAL LOW (ref 22–32)
CO2: 23 mmol/L (ref 22–32)
Calcium: 5.4 mg/dL — CL (ref 8.9–10.3)
Calcium: 8.8 mg/dL — ABNORMAL LOW (ref 8.9–10.3)
Chloride: 127 mmol/L — ABNORMAL HIGH (ref 98–111)
Chloride: 109 mmol/L (ref 98–111)
Creatinine, Ser: 0.44 mg/dL (ref 0.44–1.00)
Creatinine, Ser: 0.68 mg/dL (ref 0.44–1.00)
GFR, Estimated: >60 mL/min (ref >60)
GFR, Estimated: >60 mL/min (ref >60)
Glucose, Bld: 77 mg/dL (ref 70–99)
Glucose, Bld: 143 mg/dL — ABNORMAL HIGH (ref 70–99)
Potassium: 4.1 mmol/L (ref 3.5–5.1)
Potassium: 3.3 mmol/L — ABNORMAL LOW (ref 3.5–5.1)
Sodium: 146 mmol/L — ABNORMAL HIGH (ref 135–145)
Sodium: 143 mmol/L (ref 135–145)

## 2020-09-18 LAB — CBC
HCT: 40.5 % (ref 36.0–46.0)
Hemoglobin: 13.5 g/dL (ref 12.0–15.0)
MCH: 29.3 pg (ref 26.0–34.0)
MCHC: 33.3 g/dL (ref 30.0–36.0)
MCV: 87.9 fL (ref 80.0–100.0)
Platelets: 165 10*3/uL (ref 150–400)
RBC: 4.61 MIL/uL (ref 3.87–5.11)
RDW: 13 % (ref 11.5–15.5)
WBC: 14.8 10*3/uL — ABNORMAL HIGH (ref 4.0–10.5)
nRBC: 0 % (ref 0.0–0.2)

## 2020-09-18 LAB — HEMOGLOBIN A1C
Hgb A1c MFr Bld: >15.5 % — ABNORMAL HIGH (ref 4.8–5.6)
Mean Plasma Glucose: >398 mg/dL

## 2020-09-18 LAB — GLUCOSE, CAPILLARY
Glucose-Capillary: 109 mg/dL — ABNORMAL HIGH (ref 70–99)
Glucose-Capillary: 111 mg/dL — ABNORMAL HIGH (ref 70–99)
Glucose-Capillary: 115 mg/dL — ABNORMAL HIGH (ref 70–99)
Glucose-Capillary: 124 mg/dL — ABNORMAL HIGH (ref 70–99)
Glucose-Capillary: 167 mg/dL — ABNORMAL HIGH (ref 70–99)
Glucose-Capillary: 209 mg/dL — ABNORMAL HIGH (ref 70–99)

## 2020-09-18 LAB — LACTIC ACID, PLASMA: Lactic Acid, Venous: 0.9 mmol/L (ref 0.5–1.9)

## 2020-09-18 LAB — PHOSPHORUS
Phosphorus: 3.2 mg/dL (ref 2.5–4.6)
Phosphorus: 3.8 mg/dL (ref 2.5–4.6)

## 2020-09-18 LAB — MAGNESIUM
Magnesium: 1.9 mg/dL (ref 1.7–2.4)
Magnesium: 1.8 mg/dL (ref 1.7–2.4)

## 2020-09-18 LAB — CK: Total CK: 6709 U/L — ABNORMAL HIGH (ref 38–234)

## 2020-09-18 MED ORDER — POTASSIUM CHLORIDE 10 MEQ/100ML IV SOLN
10.0000 meq | INTRAVENOUS | Status: AC
  Administered 2020-09-18 (×4): 10 meq via INTRAVENOUS
  Filled 2020-09-18 (×4): qty 100

## 2020-09-18 MED ORDER — HYDRALAZINE HCL 20 MG/ML IJ SOLN
5.0000 mg | INTRAMUSCULAR | Status: DC | PRN
  Administered 2020-09-18: 5 mg via INTRAVENOUS
  Filled 2020-09-18: qty 1

## 2020-09-18 MED ORDER — LABETALOL HCL 5 MG/ML IV SOLN
10.0000 mg | Freq: Once | INTRAVENOUS | Status: AC
  Administered 2020-09-18: 10 mg via INTRAVENOUS
  Filled 2020-09-18: qty 4

## 2020-09-18 MED ORDER — POTASSIUM CHLORIDE 10 MEQ/100ML IV SOLN: 10.0000 meq | INTRAVENOUS | Status: DC

## 2020-09-18 MED ORDER — MIDAZOLAM 50MG/50ML (1MG/ML) PREMIX INFUSION
10.0000 mg/h | INTRAVENOUS | Status: DC
  Administered 2020-09-18: 10 mg/h via INTRAVENOUS

## 2020-09-18 MED ORDER — LACTATED RINGERS IV BOLUS
1000.0000 mL | Freq: Once | INTRAVENOUS | Status: AC
  Administered 2020-09-18: 1000 mL via INTRAVENOUS

## 2020-09-18 MED ORDER — INSULIN GLARGINE 100 UNIT/ML ~~LOC~~ SOLN
14.0000 [IU] | Freq: Two times a day (BID) | SUBCUTANEOUS | Status: DC
  Administered 2020-09-18 – 2020-09-21 (×7): 14 [IU] via SUBCUTANEOUS
  Filled 2020-09-18 (×10): qty 0.14

## 2020-09-18 MED ORDER — POTASSIUM CHLORIDE CRYS ER 20 MEQ PO TBCR: 20.0000 meq | EXTENDED_RELEASE_TABLET | Freq: Once | ORAL | Status: DC

## 2020-09-18 MED ORDER — POTASSIUM CHLORIDE 10 MEQ/50ML IV SOLN
10.0000 meq | INTRAVENOUS | Status: AC
  Administered 2020-09-18 (×4): 10 meq via INTRAVENOUS
  Filled 2020-09-18 (×4): qty 50

## 2020-09-18 MED ORDER — ORAL CARE MOUTH RINSE
15.0000 mL | Freq: Two times a day (BID) | OROMUCOSAL | Status: DC
  Administered 2020-09-18 – 2020-09-22 (×8): 15 mL via OROMUCOSAL

## 2020-09-18 NOTE — Progress Notes (Signed)
NAME:  Adalaide Jaskolski, MRN:  [id number], DOB:  1965-06-09, LOS: 2 ADMISSION DATE:  09/16/2020, CONSULTATION DATE:  6/6 REFERRING MD:  Hoing MD, CHIEF COMPLAINT:  Status epilepticus   History of Present Illness:  Susy Placzek is a 55 y.o. who presented to [facility] on 6/6 with status epilepticus   Spoke with [name] (daughter). Genae lives at home with her daughter. 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 Per daughter- Last night (6/5) was last known well. Around 1630PM, Naturi found the patient laying on her back with vomit and urine around her. When the daughter approched the patient had witnessed seizure, she was turned on side and called ems was called.  Per daughter- the patient has never had a seizure before. No new medication, does take insulin at home. Does not drink alcohol.   En route, EMS witnessed 2 additional episodes of seizures. 7.5mg  of versed was given. BG per EMS 600 in route. In the ED the patient again seized twice with no return to baseline mental status in between episodes. The patient was intubated in the ED. Work up was notable for CT of head showing no acute process. WBC 20.0, Temp 37.8, ABG 6.9/103/91/25.  Pertinent Medical History:  DM2  Significant Hospital Events: Including procedures, antibiotic start and stop dates in addition to other pertinent events   . 6/6 - Multiple seizures, admit, presented to [facility] . 6/8 - LTM, no seizures/epileptiform activity. Following commands, though slow to respond.  Interim History / Subjective:  No significant overnight events Overnight LTM without sz/epileptiform discharges  Objective   Blood pressure (!) 148/86, pulse 98, temperature 98.2 F (36.8 C), temperature source Axillary, resp. rate 20, height 5\' 3"  (1.6 m), weight 66.9 kg, SpO2 100 %.    Vent Mode: PRVC FiO2 (%):  [40 %] 40 % Set Rate:  [20 bmp-24 bmp] 20 bmp Vt Set:  [420 mL] 420 mL PEEP:  [5 cmH20] 5  cmH20 Plateau Pressure:  [15 cmH20-18 cmH20] 15 cmH20   Intake/Output Summary (Last 24 hours) at 09/18/2020 1032 Last data filed at 09/18/2020 0800 Gross per 24 hour  Intake 2006.3 ml  Output 1150 ml  Net 856.3 ml   Filed Weights   09/16/20 1920 09/18/20 0500  Weight: 72.7 kg 66.9 kg   Physical Examination: General: Acutely ill-appearing middle-aged female in NAD. HEENT: [place]/AT, anicteric sclera, PERRL, moist mucous membranes. Lip swelling noted around ETT. Neuro: Lightly sedated, nodding to questions, following commands. Responds to verbal stimuli. Following commands intermittently. Moves all 4 extremities spontaneously.  CV: RRR, no m/g/r. PULM: Breathing even and unlabored on vent (PEEP 5, FiO2 40%). Lung fields CTAB. GI: Soft, nontender, nondistended. Normoactive bowel sounds. Extremities: Trace LE edema noted. Skin: Warm/dry, no rashes.  Labs/imaging that I have personally reviewed: (right click and "Reselect all SmartList Selections" daily)  WBC 14.8 (18.9), H&H 13.5/40.5, Plt 165  Na 146 (141), K 4.1 (3.5), Cl 127 (117), CO2 15 (19) BUN 5, Cr 0.44  CK 6709 (2182)  Repeat BMP/Mg/Phos pending  LTM EEG with moderate diffuse encephalopathy, NOS. No seizure/epileptiform discharges.  Resolved Hospital Problem list   N/A  Assessment & Plan:   Status Epilepticus Acute Metabolic Encephalopathy Multiple seizures in route with EMS and in [facility] ED. HHNK vs metabolic process. UDS negative.  Fevers initially now trending down.  No sz history. Severe hyperglycemia.  - Appreciate Neurology/Epilepsy assistance - Goal RASS 0 to -1, continuing Prop/Fent - Sedation weaning per  Neuro - AEDs per Neuro - Continue IV thiamine, Vit B1 pending - Currently on LTM, decision to discontinue/extubate pending interpretation  Acute Respiratory Failure with Hypercarbia Secondary to status epilepticus. ?Aspiration. CXR- tube in good position, rt atelectasis vs effusion. ABG 6.9/103/91/25 -  Continue full vent support (4-8cc/kg IBW) - Wean FiO2 as able for O2 sat > 90% - Daily WUA/SBT - VAP bundle - Pulmonary hygiene - PAD protocol for sedation: Propofol and Fentanyl for goal RASS 0 to -1  - Continue ceftriaxone Q12 x 5 days (end 6/11)  Hyperglycemia- Initial BG 623 HHNK Hx DM2 Required insulin infusion, now discontinued. - Continue Lantus 12U BID per DM coordinator - Trend CBGs, - Trend BP - F/u A1c  AKI: dehydration, rhabdo Elevated CK Hypophoshatemia - Trend BMP, f/u repeat today 6/8 (pending) - Replete electrolytes as indicated, to include Ca - Continue IV hydration, additional 1L LR bolus given - Monitor I&Os - F/u urine studies - Avoid nephrotoxic agents as able - Ensure adequate renal perfusion  Lactic Acidosis: likely sz related. Downtrending.  - Continue to monitor, repeat sent 6/8AM  Fever Leukocytosis ?S/p seizing vs. aspiration pneumonitis - Trend WBC, fever curve - Continue ceftriaxone - Intermittent CXR  Best practice (right click and "Reselect all SmartList Selections" daily)  Diet:  NPO Pain/Anxiety/Delirium protocol (if indicated): Yes (RASS goal -1,-2) VAP protocol (if indicated): Yes DVT prophylaxis: Subcutaneous Heparin GI prophylaxis: PPI Glucose control:  Insulin gtt Central venous access:  N/A Arterial line:  N/A Foley:  N/A Mobility:  bed rest  PT consulted: N/A Last date of multidisciplinary goals of care discussion [Pending] Daughters updated 6/7 Code Status:  full code Disposition: ICU  Critical care time: 41 minutes    Tim Lair, PA-C [facility] 09/18/20 10:55 AM  Please see [email or web address] for pager details.  From 7A-7P if no response, please call [phone] After hours, please call ELink [phone]

## 2020-09-18 NOTE — Procedures (Signed)
Patient Name:Tricia Bridges YDX:[id number] Epilepsy Attending:Anasia Agro Annabelle Harman Referring Physician/Provider:Dr Erick Blinks Duration:09/18/2020 [phone] to 09/18/2020 1015  Patient history:54 y.o.femalewith PMH significant for diabetes, HTN who was found down and had several seizures with clustering. EEG to evaluate for seizure.  Level of alertness: awake, asleep  AEDs during EEG study:LEV  Technical aspects: This EEG study was done with scalp electrodes positioned according to the 10-20 International system of electrode placement. Electrical activity was acquired at a sampling rate of 500Hz  and reviewed with a high frequency filter of 70Hz  and a low frequency filter of 1Hz . EEG data were recorded continuously and digitally stored.   Description: During awake state, no clear posterior dominant rhythm was seen. EEG showed continuous generalized 2 to 5 Hz theta-delta slowing.  ABNORMALITY -Continuous slow, generalized  IMPRESSION: This study is suggestive of moderate diffuse encephalopathy, nonspecific etiology.  No seizures or epileptiform discharges were seen throughout the recording.  Kenley Troop 

## 2020-09-18 NOTE — Progress Notes (Signed)
Date and time results received: 09/18/20 0641   Test: Calcium Critical Value: 5.3  Name of Provider Notified: Gretchin (E-Link RN)  Orders Received? Or Actions Taken?: Awaiting orders.

## 2020-09-18 NOTE — Progress Notes (Addendum)
Subjective: No clinical seizures overnight.  Has been off sedation.  Daughter and another family member at bedside.  ROS: Unable to obtain due to poor mental status  Examination  Vital signs in last 24 hours: Temp:  [97.7 F (36.5 C)-98.9 F (37.2 C)] 98.2 F (36.8 C) (06/08 1200) Pulse Rate:  [72-135] 135 (06/08 1210) Resp:  [17-24] 17 (06/08 1210) BP: (130-184)/(82-117) 175/103 (06/08 1200) SpO2:  [99 %-100 %] 100 % (06/08 1210) FiO2 (%):  [32 %-40 %] 32 % (06/08 1210) Weight:  [66.9 kg] 66.9 kg (06/08 0500)  General: Lying in bed, not in distress CVS: pulse-normal rate and rhythm RS: Intubated, CTAB Extremities: normal, warm Neuro: Opens eyes to repeated verbal stimulation, follows simple commands like closing her eyes and wiggling her toes, PERRLA, no gaze deviation, cough reflex intact, antigravity strength in all 4 extremities  Basic Metabolic Panel: Recent Labs  Lab 09/17/20 0336 09/17/20 0416 09/17/20 1444 09/17/20 1719 09/17/20 1845 09/17/20 2300 09/18/20 0546 09/18/20 0935  NA 145   < > 141 143 139 141 146* 143  K 3.9   < > 2.4* 3.3* 5.2* 3.5 4.1 3.3*  CL 109   < > 119*  --  110 117* 127* 109  CO2 22   < > 17*  --  23 19* 15* 23  GLUCOSE 195*   < > 209*  --  238* 172* 77 143*  BUN 13   < > 9  --  10 8 5* 6  CREATININE 0.91   < > 0.72  --  0.76 0.67 0.44 0.68  CALCIUM 9.5   < > 6.6*  --  10.6* 7.3* 5.4* 8.8*  MG 2.3  --   --   --   --   --  1.9 1.8  PHOS 2.3*  --   --   --   --   --  3.2 3.8   < > = values in this interval not displayed.    CBC: Recent Labs  Lab 09/16/20 1748 09/16/20 1809 09/16/20 2130 09/17/20 0336 09/17/20 0416 09/17/20 1719 09/18/20 0546  WBC 20.0*  --   --  18.9*  --   --  14.8*  NEUTROABS 18.2*  --   --   --   --   --   --   HGB 15.5*   < > 15.6* 16.0* 14.3 13.3 13.5  HCT 49.1*   < > 46.0 47.1* 42.0 39.0 40.5  MCV 90.9  --   --  86.1  --   --  87.9  PLT 246  --   --  204  --   --  165   < > = values in this interval not  displayed.     Coagulation Studies: No results for input(s): LABPROT, INR in the last 72 hours.  Imaging No new brain imaging overnight  ASSESSMENT AND PLAN: 55 year old female who presented with status epilepticus in the setting nonketotic hyperglycemia.  Status epilepticus (resolved) Acute encephalopathy, likely postictal, toxic-metabolic -Likely provoked status epilepticus in the setting of hyperglycemia  Recommendations -LTM EEG did not show any seizures overnight. Will discontinue EEG today -Recommend Keppra 500 mg twice daily for maintenance -We will obtain MRI brain without contrast to look for any acute abnormality - Continue seizure precautions - As needed IV Ativan 2 mg for clinical seizure-like activity - Management of rest of comorbidities per primary team  I have spent a total of  35 minutes with the patient reviewing hospital  notes,  test results, labs and examining the patient as well as establishing an assessment and plan that was discussed personally with the patient's family at bedside.  > 50% of time was spent in direct patient care.    Lindie Spruce Epilepsy [facility] For questions after 5pm please refer to AMION to reach the Neurologist on call

## 2020-09-18 NOTE — Progress Notes (Signed)
LTM EEG discontinued - no skin breakdown at unhook.   

## 2020-09-18 NOTE — Progress Notes (Signed)
[facility] ADULT ICU REPLACEMENT PROTOCOL   The patient does apply for the [facility] Adult ICU Electrolyte Replacment Protocol based on the criteria listed below:   1. Is GFR >/= 30 ml/min? Yes.    Patient's GFR today is >60 2. Is SCr </= 2? Yes.   Patient's SCr is 0.67 ml/kg/hr 3. Did SCr increase >/= 0.5 in 24 hours? No. 4. Abnormal electrolyte(s): k 3.5 5. Ordered repletion with: protocol ber picc 6. If a panic level lab has been reported, has the CCM MD in charge been notified? Yes.  .   Physician:  Mauri Pole, Shondale Quinley A 09/18/2020 12:07 AM

## 2020-09-18 NOTE — Progress Notes (Signed)
LTM maint complete - maiin P3 T8 Pz [facility] monitored, Event button test confirmed by [facility].

## 2020-09-18 NOTE — Procedures (Addendum)
Extubation Procedure Note  Patient Details:   Name: Tricia Bridges DOB: February 26, 1966 MRN: [id number]   Airway Documentation:    Vent end date: 09/18/20 Vent end time: 1209   Evaluation  O2 sats: stable throughout Complications: No apparent complications Patient did tolerate procedure well. Bilateral Breath Sounds: Rhonchi   No  Pt extubated per order to 3L [place]. RN and MD at bedside. Positive cuff leak noted prior to extubation. Vital signs stable RT will continue to monitor.  Rosalita Levan 09/18/2020, 12:21 PM

## 2020-09-19 ENCOUNTER — Inpatient Hospital Stay (HOSPITAL_COMMUNITY): Payer: Self-pay

## 2020-09-19 DIAGNOSIS — E119 Type 2 diabetes mellitus without complications: Secondary | ICD-10-CM

## 2020-09-19 DIAGNOSIS — Z794 Long term (current) use of insulin: Secondary | ICD-10-CM

## 2020-09-19 DIAGNOSIS — I639 Cerebral infarction, unspecified: Secondary | ICD-10-CM

## 2020-09-19 DIAGNOSIS — I10 Essential (primary) hypertension: Secondary | ICD-10-CM

## 2020-09-19 LAB — LIPID PANEL
Cholesterol: 245 mg/dL — ABNORMAL HIGH (ref 0–200)
HDL: 47 mg/dL (ref >40)
LDL Cholesterol: 179 mg/dL — ABNORMAL HIGH (ref 0–99)
Total CHOL/HDL Ratio: 5.2 RATIO
Triglycerides: 97 mg/dL (ref <150)
VLDL: 19 mg/dL (ref 0–40)

## 2020-09-19 LAB — BASIC METABOLIC PANEL
Anion gap: 15 (ref 5–15)
BUN: 5 mg/dL — ABNORMAL LOW (ref 6–20)
CO2: 24 mmol/L (ref 22–32)
Calcium: 9 mg/dL (ref 8.9–10.3)
Chloride: 99 mmol/L (ref 98–111)
Creatinine, Ser: 0.71 mg/dL (ref 0.44–1.00)
GFR, Estimated: >60 mL/min (ref >60)
Glucose, Bld: 135 mg/dL — ABNORMAL HIGH (ref 70–99)
Potassium: 3.4 mmol/L — ABNORMAL LOW (ref 3.5–5.1)
Sodium: 138 mmol/L (ref 135–145)

## 2020-09-19 LAB — CBC
HCT: 44.4 % (ref 36.0–46.0)
Hemoglobin: 14.6 g/dL (ref 12.0–15.0)
MCH: 29.1 pg (ref 26.0–34.0)
MCHC: 32.9 g/dL (ref 30.0–36.0)
MCV: 88.4 fL (ref 80.0–100.0)
Platelets: 172 10*3/uL (ref 150–400)
RBC: 5.02 MIL/uL (ref 3.87–5.11)
RDW: 12.9 % (ref 11.5–15.5)
WBC: 12.7 10*3/uL — ABNORMAL HIGH (ref 4.0–10.5)
nRBC: 0 % (ref 0.0–0.2)

## 2020-09-19 LAB — CK: Total CK: 15801 U/L — ABNORMAL HIGH (ref 38–234)

## 2020-09-19 LAB — GLUCOSE, CAPILLARY
Glucose-Capillary: 133 mg/dL — ABNORMAL HIGH (ref 70–99)
Glucose-Capillary: 79 mg/dL (ref 70–99)
Glucose-Capillary: 90 mg/dL (ref 70–99)
Glucose-Capillary: 131 mg/dL — ABNORMAL HIGH (ref 70–99)
Glucose-Capillary: 142 mg/dL — ABNORMAL HIGH (ref 70–99)

## 2020-09-19 LAB — MAGNESIUM: Magnesium: 1.8 mg/dL (ref 1.7–2.4)

## 2020-09-19 LAB — PHOSPHORUS: Phosphorus: 4.5 mg/dL (ref 2.5–4.6)

## 2020-09-19 LAB — HEMOGLOBIN A1C
Hgb A1c MFr Bld: >15.5 % — ABNORMAL HIGH (ref 4.8–5.6)
Mean Plasma Glucose: >398 mg/dL

## 2020-09-19 MED ORDER — POTASSIUM CHLORIDE IN NACL 20-0.9 MEQ/L-% IV SOLN
INTRAVENOUS | Status: DC
  Filled 2020-09-19 (×2): qty 1000

## 2020-09-19 MED ORDER — ATORVASTATIN CALCIUM 40 MG PO TABS: 40.0000 mg | ORAL_TABLET | Freq: Every day | ORAL | Status: DC

## 2020-09-19 MED ORDER — ASPIRIN 81 MG PO CHEW
81.0000 mg | CHEWABLE_TABLET | Freq: Every day | ORAL | Status: DC
  Administered 2020-09-19 – 2020-09-22 (×4): 81 mg via ORAL
  Filled 2020-09-19 (×4): qty 1

## 2020-09-19 MED ORDER — POTASSIUM CHLORIDE 10 MEQ/50ML IV SOLN
10.0000 meq | INTRAVENOUS | Status: AC
  Administered 2020-09-19 (×4): 10 meq via INTRAVENOUS
  Filled 2020-09-19 (×5): qty 50

## 2020-09-19 MED ORDER — AMLODIPINE BESYLATE 5 MG PO TABS
5.0000 mg | ORAL_TABLET | Freq: Every day | ORAL | Status: DC
  Administered 2020-09-19 – 2020-09-22 (×4): 5 mg via ORAL
  Filled 2020-09-19 (×3): qty 1

## 2020-09-19 MED ORDER — INSULIN ASPART 100 UNIT/ML IJ SOLN
0.0000 [IU] | Freq: Three times a day (TID) | INTRAMUSCULAR | Status: DC
  Administered 2020-09-20: 8 [IU] via SUBCUTANEOUS
  Administered 2020-09-21: 2 [IU] via SUBCUTANEOUS
  Administered 2020-09-21: 8 [IU] via SUBCUTANEOUS

## 2020-09-19 NOTE — Progress Notes (Signed)
Subjective: No acute events overnight.  Daughters at bedside.  ROS: negative except above   Examination  Vital signs in last 24 hours: Temp:  [98.2 F (36.8 C)-99 F (37.2 C)] 99 F (37.2 C) (06/09 0900) Pulse Rate:  [71-135] 79 (06/09 1100) Resp:  [16-23] 19 (06/09 1100) BP: (141-175)/(84-111) 159/93 (06/09 1100) SpO2:  [97 %-100 %] 97 % (06/09 1100) FiO2 (%):  [32 %] 32 % (06/08 1210) Weight:  [61.8 kg] 61.8 kg (06/09 0451)  General: lying in bed, not in apparent distress CVS: pulse-normal rate and rhythm RS: Symmetric expansion, CTAB Extremities: normal, warm  Neuro: MS: Alert, oriented to place and person only, not to time, follows commands CN: pupils equal and reactive,  EOMI, face symmetric, tongue midline, normal sensation over face Motor: 5/5 strength in all 4 extremities Reflexes: 2+ bilaterally over patella, biceps, plantars: flexor Coordination: normal  Basic Metabolic Panel: Recent Labs  Lab 09/17/20 0336 09/17/20 0416 09/17/20 1845 09/17/20 2300 09/18/20 0546 09/18/20 0935 09/18/20 1635 09/19/20 0322  NA 145   < > 139 141 146* 143 141 138  K 3.9   < > 5.2* 3.5 4.1 3.3* 3.2* 3.4*  CL 109   < > 110 117* 127* 109  --  99  CO2 22   < > 23 19* 15* 23  --  24  GLUCOSE 195*   < > 238* 172* 77 143*  --  135*  BUN 13   < > 10 8 5* 6  --  5*  CREATININE 0.91   < > 0.76 0.67 0.44 0.68  --  0.71  CALCIUM 9.5   < > 10.6* 7.3* 5.4* 8.8*  --  9.0  MG 2.3  --   --   --  1.9 1.8  --  1.8  PHOS 2.3*  --   --   --  3.2 3.8  --  4.5   < > = values in this interval not displayed.    CBC: Recent Labs  Lab 09/16/20 1748 09/16/20 1809 09/17/20 0336 09/17/20 0416 09/17/20 1719 09/18/20 0546 09/18/20 1635 09/19/20 0322  WBC 20.0*  --  18.9*  --   --  14.8*  --  12.7*  NEUTROABS 18.2*  --   --   --   --   --   --   --   HGB 15.5*   < > 16.0* 14.3 13.3 13.5 14.3 14.6  HCT 49.1*   < > 47.1* 42.0 39.0 40.5 42.0 44.4  MCV 90.9  --  86.1  --   --  87.9  --  88.4   PLT 246  --  204  --   --  165  --  172   < > = values in this interval not displayed.     Coagulation Studies: No results for input(s): LABPROT, INR in the last 72 hours.  Imaging MRI brain without contrast 09/18/2020: 1.2 cm focus of diffusion abnormality involving the left caudate, consistent with an acute to early subacute ischemic infarct. No associated hemorrhage or mass effect. 2. No other acute intracranial abnormality.      ASSESSMENT AND PLAN: 55 year old female who presented with status epilepticus in the setting nonketotic hyperglycemia.   Status epilepticus (resolved) Acute encephalopathy, resolved Left caudate acute ischemic stroke Elevated CK -Likely provoked status epilepticus in the setting of hyperglycemia -May have restricted diffusion on MRI could be an acute stroke versus restricted diffusion due to hypoglycemia   Recommendations -We will order  MRA head and neck, TTE, lipid panel as part of stroke work-up -Start patient on aspirin 81 mg for secondary stroke prevention.  Patient had elevated CK therefore will hold atorvastatin for now. -Recommend Keppra 500 mg twice daily for maintenance - Continue seizure precautions -Recommend follow-up with neurology in 3 months   I have spent a total of  35 minutes with the patient reviewing hospital notes,  test results, labs and examining the patient as well as establishing an assessment and plan that was discussed personally with the patient's family at bedside.  > 50% of time was spent in direct patient care.   Lindie Spruce Epilepsy [facility] For questions after 5pm please refer to AMION to reach the Neurologist on call

## 2020-09-19 NOTE — Progress Notes (Signed)
PICC line difficult to flush with poor blood return noted. Order from Dr. Butler Denmark to have IV team evaluate and if PIV possible, okay to transition to that. IV team came to bedside and resolved issues with PICC line.  Robina Ade, RN

## 2020-09-19 NOTE — Progress Notes (Signed)
STAT CK sent to lab as soon as pt arrived back from MRI.   Robina Ade, RN

## 2020-09-19 NOTE — Progress Notes (Addendum)
PROGRESS NOTE    Tricia Bridges   TIW:[id number]  DOB: 10-21-1965  DOA: 09/16/2020 PCP: Pcp, No   Brief Narrative:  Tricia Bridges is a 55 year old female who presented to [facility] in status epilepticus.  No prior history of seizures.  EMS witnessed 2 episodes of seizures in addition to at least 1 seizure at home.  The patient was given Versed.  In the ED the patient was noted to seize twice.  She was intubated and admitted to the ICU.  Propofol used for seizure suppression. In the ED CBG was in the 600s, WBC count was 20.  She had acute respiratory failure with hypercarbia (pH 6.9 and PCO2 103), CBG of 623, lactic acidosis with a level of 5.7, fever of 100. Started on an insulin infusion.   Subjective: She has no complaints other than her throat being very sore (from intubation).    Assessment & Plan:   Principal Problem:   Status epilepticus /acute/subacute CVA -Appreciate neurology management -Status post LTM -Now on 500 mg of Keppra twice daily per neurology- -MRI performed on 6/8> 1.2 cm focus of diffusion abnormality involving the left caudate, consistent with an acute to early subacute ischemic infarct -I suspect seizures may be secondary to the CVA - ASA resumed- f/u stroke w/u    Active Problems: Essential hypertension -Continue to allow for permissive hypertension today -Continue amlodipine - enalapril and HCTZ being held for now  Type 2 diabetes mellitus , uncontrolled with hyperglycemia with long-term current use of insulin  -HBA1c > 15.5when she was admitted -She takes Lantus and Glucophage as outpatient -Continue Lantus and sliding scale insulin at this time and follow glucose levels    Acute respiratory failure with hypercapnia-vent dependent respiratory failure ?  Aspiration pneumonia secondary to seizures fever on admission, leukocytosis and right sided infiltrate/atelectasis on xray --cont ceftriaxone will be completed today     Acute  metabolic encephalopathy - alert and has even eaten but is very slow to respond- may be postictal  Hypokalemia - Aggressively replaced    Non-traumatic rhabdomyolysis -CK 6709 > 15, 801> 10, 234-  -continue IV fluids to prevent AKI  Time spent in minutes: 35 DVT prophylaxis: heparin injection 5,000 Units Start: 09/16/20 2200 SCDs Start: 09/16/20 1929 Code Status: Full code Family Communication: daughters Level of Care: Level of care: Med-Surg Disposition Plan:  Status is: Inpatient  Remains inpatient appropriate because:IV treatments appropriate due to intensity of illness or inability to take PO  Dispo: The patient is from: Home              Anticipated d/c is to: Home              Patient currently is not medically stable to d/c.   Difficult to place patient No      Consultants:  Neurology PCCM Procedures:  Intubation Antimicrobials:  Anti-infectives (From admission, onward)    Start     Dose/Rate Route Frequency Ordered Stop   09/18/20 0800  cefTRIAXone (ROCEPHIN) 2 g in sodium chloride 0.9 % 100 mL IVPB        2 g 200 mL/hr over 30 Minutes Intravenous Every 24 hours 09/17/20 0950 09/22/20 0759   09/16/20 2200  cefTRIAXone (ROCEPHIN) 2 g in sodium chloride 0.9 % 100 mL IVPB  Status:  Discontinued        2 g 200 mL/hr over 30 Minutes Intravenous Every 12 hours 09/16/20 2057 09/17/20 0950        Objective: Vitals:  09/19/20 0900 09/19/20 1000 09/19/20 1100 09/19/20 1241  BP: (!) 157/93 (!) 164/91 (!) 159/93 (!) 151/94  Pulse: 77 (!) 113 79 87  Resp: Temp: 99 F (37.2 C)   98.9 F (37.2 C)  TempSrc: Oral   Oral  SpO2: 97% 99% 97% 98%  Weight:      Height:        Intake/Output Summary (Last 24 hours) at 09/19/2020 1454 Last data filed at 09/19/2020 1100 Gross per 24 hour  Intake 2152.55 ml  Output 1900 ml  Net 252.55 ml   Filed Weights   09/16/20 1920 09/18/20 0500 09/19/20 0451  Weight: 72.7 kg 66.9 kg 61.8 kg     Examination: General exam: Appears comfortable  HEENT: PERRLA, oral mucosa moist, no sclera icterus or thrush Respiratory system: Clear to auscultation. Respiratory effort normal. Cardiovascular system: S1 & S2 heard, RRR.   Gastrointestinal system: Abdomen soft, non-tender, nondistended. Normal bowel sounds. Central nervous system: Alert and oriented. No focal neurological deficits. Extremities: No cyanosis, clubbing or edema Skin: No rashes or ulcers Psychiatry:  Mood & affect appropriate.     Data Reviewed: I have personally reviewed following labs and imaging studies  CBC: Recent Labs  Lab 09/16/20 1748 09/16/20 1809 09/17/20 0336 09/17/20 0416 09/17/20 1719 09/18/20 0546 09/18/20 1635 09/19/20 0322  WBC 20.0*  --  18.9*  --   --  14.8*  --  12.7*  NEUTROABS 18.2*  --   --   --   --   --   --   --   HGB 15.5*   < > 16.0* 14.3 13.3 13.5 14.3 14.6  HCT 49.1*   < > 47.1* 42.0 39.0 40.5 42.0 44.4  MCV 90.9  --  86.1  --   --  87.9  --  88.4  PLT 246  --  204  --   --  165  --  172   < > = values in this interval not displayed.   Basic Metabolic Panel: Recent Labs  Lab 09/17/20 0336 09/17/20 0416 09/17/20 1845 09/17/20 2300 09/18/20 0546 09/18/20 0935 09/18/20 1635 09/19/20 0322  NA 145   < > 139 141 146* 143 141 138  K 3.9   < > 5.2* 3.5 4.1 3.3* 3.2* 3.4*  CL 109   < > 110 117* 127* 109  --  99  CO2 22   < > 23 19* 15* 23  --  24  GLUCOSE 195*   < > 238* 172* 77 143*  --  135*  BUN 13   < > 10 8 5* 6  --  5*  CREATININE 0.91   < > 0.76 0.67 0.44 0.68  --  0.71  CALCIUM 9.5   < > 10.6* 7.3* 5.4* 8.8*  --  9.0  MG 2.3  --   --   --  1.9 1.8  --  1.8  PHOS 2.3*  --   --   --  3.2 3.8  --  4.5   < > = values in this interval not displayed.   GFR: Estimated Creatinine Clearance: 65 mL/min (by C-G formula based on SCr of 0.71 mg/dL). Liver Function Tests: Recent Labs  Lab 09/16/20 1748  AST 32  ALT 29  ALKPHOS 135*  BILITOT 0.5  PROT 7.4  ALBUMIN  3.7   No results for input(s): LIPASE, AMYLASE in the last 168 hours. Recent Labs  Lab 09/16/20 2336  AMMONIA 27  Coagulation Profile: No results for input(s): INR, PROTIME in the last 168 hours. Cardiac Enzymes: Recent Labs  Lab 09/16/20 2336 09/17/20 0336 09/18/20 0546  CKTOTAL 1,182* 2,182* 6,709*   BNP (last 3 results) No results for input(s): PROBNP in the last 8760 hours. HbA1C: Recent Labs    09/17/20 0336 09/17/20 2125  HGBA1C >15.5* >15.5*   CBG: Recent Labs  Lab 09/18/20 1951 09/18/20 2349 09/19/20 0348 09/19/20 0853 09/19/20 1203  GLUCAP 111* 124* 133* 79 90   Lipid Profile: Recent Labs    09/17/20 0336  TRIG 116   Thyroid Function Tests: No results for input(s): TSH, T4TOTAL, FREET4, T3FREE, THYROIDAB in the last 72 hours. Anemia Panel: No results for input(s): VITAMINB12, FOLATE, FERRITIN, TIBC, IRON, RETICCTPCT in the last 72 hours. Urine analysis:    Component Value Date/Time   COLORURINE STRAW (A) 06/15/2016 1711   APPEARANCEUR CLEAR 06/15/2016 1711   LABSPEC 1.026 06/15/2016 1711   PHURINE 5.0 06/15/2016 1711   GLUCOSEU >=500 (A) 06/15/2016 1711   HGBUR NEGATIVE 06/15/2016 1711   BILIRUBINUR NEGATIVE 06/15/2016 1711   KETONESUR 5 (A) 06/15/2016 1711   PROTEINUR NEGATIVE 06/15/2016 1711   NITRITE NEGATIVE 06/15/2016 1711   LEUKOCYTESUR NEGATIVE 06/15/2016 1711   Sepsis Labs: @LABRCNTIP (procalcitonin:4,lacticidven:4) ) Recent Results (from the past 240 hour(s))  Culture, blood (routine x 2)     Status: None (Preliminary result)   Collection Time: 09/16/20  5:48 PM   Specimen: BLOOD  Result Value Ref Range Status   Specimen Description BLOOD BLOOD RIGHT FOREARM  Final   Special Requests   Final    BOTTLES DRAWN AEROBIC AND ANAEROBIC Blood Culture results may not be optimal due to an inadequate volume of blood received in culture bottles   Culture   Final    NO GROWTH 3 DAYS Performed at [facility], [address],  [place], [place]    Report Status PENDING  Incomplete  SARS CORONAVIRUS 2 (TAT 6-24 HRS) Nasopharyngeal Nasopharyngeal Swab     Status: None   Collection Time: 09/16/20  6:39 PM   Specimen: Nasopharyngeal Swab  Result Value Ref Range Status   SARS Coronavirus 2 NEGATIVE NEGATIVE Final    Comment: (NOTE) SARS-CoV-2 target nucleic acids are NOT DETECTED.  The SARS-CoV-2 RNA is generally detectable in upper and lower respiratory specimens during the acute phase of infection. Negative results do not preclude SARS-CoV-2 infection, do not rule out co-infections with other pathogens, and should not be used as the sole basis for treatment or other patient management decisions. Negative results must be combined with clinical observations, patient history, and epidemiological information. The expected result is Negative.  Fact Sheet for Patients: 11/16/20  Fact Sheet for Healthcare Providers: HairSlick.no  This test is not yet approved or cleared by the quierodirigir.com FDA and  has been authorized for detection and/or diagnosis of SARS-CoV-2 by FDA under an Emergency Use Authorization (EUA). This EUA will remain  in effect (meaning this test can be used) for the duration of the COVID-19 declaration under Se ction 564(b)(1) of the Act, 21 U.S.C. section 360bbb-3(b)(1), unless the authorization is terminated or revoked sooner.  Performed at [facility], [address] [place], [place]   Culture, blood (routine x 2)     Status: None (Preliminary result)   Collection Time: 09/16/20  9:20 PM   Specimen: BLOOD  Result Value Ref Range Status   Specimen Description BLOOD SITE NOT SPECIFIED  Final  Special Requests   Final    BOTTLES DRAWN AEROBIC AND ANAEROBIC Blood Culture results may not be optimal due to an inadequate volume of blood received in culture bottles   Culture   Final    NO GROWTH 3  DAYS Performed at [facility], [address], [place], [place] [address]    Report Status PENDING  Incomplete  MRSA PCR Screening     Status: None   Collection Time: 09/16/20 10:28 PM   Specimen: Nasal Mucosa; Nasopharyngeal  Result Value Ref Range Status   MRSA by PCR NEGATIVE NEGATIVE Final    Comment:        The GeneXpert MRSA Assay (FDA approved for NASAL specimens only), is one component of a comprehensive MRSA colonization surveillance program. It is not intended to diagnose MRSA infection nor to guide or monitor treatment for MRSA infections. Performed at [facility], [address] [place], [place] [address]          Radiology Studies: MR BRAIN WO CONTRAST  Result Date: 09/19/2020 CLINICAL DATA:  Initial evaluation for acute seizure, abnormal neuro exam. EXAM: MRI HEAD WITHOUT CONTRAST TECHNIQUE: Multiplanar, multiecho pulse sequences of the brain and surrounding structures were obtained without intravenous contrast. COMPARISON:  Prior CT from 09/16/2020. FINDINGS: Brain: Cerebral volume within normal limits for age. Minimal T2/FLAIR hyperintensity noted involving the periventricular white matter, nonspecific, but most likely related to chronic microvascular ischemic disease, extremely mild for age. 1.2 cm focus of diffusion abnormality seen involving the left caudate, consistent with an acute to early subacute ischemic infarct (series 5, image 83). No associated hemorrhage or mass effect. No other diffusion abnormality to suggest acute or subacute ischemia or changes related to seizure. Gray-white matter differentiation otherwise maintained. No encephalomalacia to suggest chronic cortical infarction elsewhere within the brain. No other evidence for acute or chronic intracranial hemorrhage. No mass lesion, midline shift or mass effect. No hydrocephalus or extra-axial fluid collection. Pituitary gland suprasellar region within normal limits. Midline structures  intact. No intrinsic temporal lobe abnormality. Vascular: Major intracranial vascular flow voids are maintained. Skull and upper cervical spine: Craniocervical junction within normal limits. Bone marrow signal intensity within normal limits. Susceptibility artifact emanates from the left occipital scalp. Scalp soft tissues demonstrate no other acute finding. Sinuses/Orbits: Globes and orbital soft tissues demonstrate no acute finding. Paranasal sinuses are largely clear. Small to moderate bilateral mastoid effusions. Visualized nasopharynx within normal limits. Other: None. IMPRESSION: 1. 1.2 cm focus of diffusion abnormality involving the left caudate, consistent with an acute to early subacute ischemic infarct. No associated hemorrhage or mass effect. 2. No other acute intracranial abnormality. Electronically Signed   By: Rise Mu M.D.   On: 09/19/2020 00:58   DG Chest Port 1 View  Result Date: 09/18/2020 CLINICAL DATA:  Leukocytosis.  History of trauma. EXAM: PORTABLE CHEST 1 VIEW COMPARISON:  Chest x-ray 09/16/2020. FINDINGS: Right PICC line noted with its tip over the lower right atrium. Proximal repositioning of approximately 8 cm should be considered. Endotracheal tube and NG tube in stable position. Heart size stable. Persistent right base atelectasis/infiltrate. Tiny right pleural effusion cannot be excluded. No pneumothorax. A fracture of the posterolateral aspect of the left fifth rib cannot be excluded. IMPRESSION: 1. Right PICC line noted with its tip over the lower right atrium. Proximal repositioning of approximately 8 cm should be considered. Endotracheal tube and NG tube in stable position. 2. Persistent right base atelectasis/infiltrate. Tiny right pleural effusion cannot be  excluded. 3. A fracture of the posterolateral aspect of left rib cannot be excluded. No pneumothorax. Electronically Signed   By: Maisie Fushomas  Register   On: 09/18/2020 05:53      Scheduled Meds:  amLODipine  5  mg Oral Daily   aspirin  81 mg Oral Daily   Chlorhexidine Gluconate Cloth  6 each Topical Daily   fentaNYL (SUBLIMAZE) injection  50 mcg Intravenous Once   heparin  5,000 Units Subcutaneous Q8H   insulin aspart  0-15 Units Subcutaneous TID WC   insulin glargine  14 Units Subcutaneous BID   mouth rinse  15 mL Mouth Rinse BID   polyethylene glycol  17 g Per Tube Daily   sodium chloride flush  10-40 mL Intracatheter Q12H   [START ON 09/25/2020] thiamine injection  100 mg Intravenous Daily   Continuous Infusions:  cefTRIAXone (ROCEPHIN)  IV 2 g (09/19/20 16100812)   levETIRAcetam 500 mg (09/19/20 0740)   thiamine injection 250 mg (09/19/20 1208)     LOS: 3 days      Calvert CantorSaima Jaysha Lasure, MD [facility] Pager: [email or web address] 09/19/2020, 2:54 PM

## 2020-09-19 NOTE — Progress Notes (Signed)
Pt arrived to 4NP04 from 4NICU. Report received from [employer], [place]. Pt and belongings brought to room, 2 daughters, [place] and Delorise Jackson, at bedside. Vitals WNL, full assessment completed and documented. Pt denies pain and has no concerns at this time.   Robina Ade, RN

## 2020-09-20 ENCOUNTER — Inpatient Hospital Stay (HOSPITAL_COMMUNITY): Payer: Self-pay

## 2020-09-20 ENCOUNTER — Telehealth: Payer: Self-pay

## 2020-09-20 DIAGNOSIS — I6389 Other cerebral infarction: Secondary | ICD-10-CM

## 2020-09-20 LAB — BASIC METABOLIC PANEL
Anion gap: 10 (ref 5–15)
BUN: 7 mg/dL (ref 6–20)
CO2: 22 mmol/L (ref 22–32)
Calcium: 8.2 mg/dL — ABNORMAL LOW (ref 8.9–10.3)
Chloride: 106 mmol/L (ref 98–111)
Creatinine, Ser: 0.56 mg/dL (ref 0.44–1.00)
GFR, Estimated: >60 mL/min (ref >60)
Glucose, Bld: 82 mg/dL (ref 70–99)
Potassium: 5.1 mmol/L (ref 3.5–5.1)
Sodium: 138 mmol/L (ref 135–145)

## 2020-09-20 LAB — CBC
HCT: 43.1 % (ref 36.0–46.0)
Hemoglobin: 14.3 g/dL (ref 12.0–15.0)
MCH: 29.3 pg (ref 26.0–34.0)
MCHC: 33.2 g/dL (ref 30.0–36.0)
MCV: 88.3 fL (ref 80.0–100.0)
Platelets: 171 10*3/uL (ref 150–400)
RBC: 4.88 MIL/uL (ref 3.87–5.11)
RDW: 12.3 % (ref 11.5–15.5)
WBC: 7.6 10*3/uL (ref 4.0–10.5)
nRBC: 0 % (ref 0.0–0.2)

## 2020-09-20 LAB — GLUCOSE, CAPILLARY
Glucose-Capillary: 71 mg/dL (ref 70–99)
Glucose-Capillary: 269 mg/dL — ABNORMAL HIGH (ref 70–99)
Glucose-Capillary: 95 mg/dL (ref 70–99)
Glucose-Capillary: 199 mg/dL — ABNORMAL HIGH (ref 70–99)

## 2020-09-20 LAB — ECHOCARDIOGRAM COMPLETE
Area-P 1/2: 3.85 cm2
Height: 63 in
S' Lateral: 2.8 cm
Single Plane A4C EF: 73.2 %
Weight: 2179.91 oz

## 2020-09-20 LAB — PHOSPHORUS: Phosphorus: 3.1 mg/dL (ref 2.5–4.6)

## 2020-09-20 LAB — CK: Total CK: 10234 U/L — ABNORMAL HIGH (ref 38–234)

## 2020-09-20 LAB — MAGNESIUM: Magnesium: 1.7 mg/dL (ref 1.7–2.4)

## 2020-09-20 MED ORDER — POLYETHYLENE GLYCOL 3350 17 G PO PACK
17.0000 g | PACK | Freq: Every day | ORAL | Status: DC
  Administered 2020-09-20 – 2020-09-22 (×3): 17 g via ORAL
  Filled 2020-09-20 (×2): qty 1

## 2020-09-20 MED ORDER — LABETALOL HCL 5 MG/ML IV SOLN: 10.0000 mg | INTRAVENOUS | Status: DC | PRN

## 2020-09-20 MED ORDER — CLOPIDOGREL BISULFATE 75 MG PO TABS
75.0000 mg | ORAL_TABLET | Freq: Every day | ORAL | Status: DC
  Administered 2020-09-20 – 2020-09-22 (×3): 75 mg via ORAL
  Filled 2020-09-20 (×3): qty 1

## 2020-09-20 MED ORDER — LEVETIRACETAM 500 MG PO TABS
500.0000 mg | ORAL_TABLET | Freq: Two times a day (BID) | ORAL | Status: DC
  Administered 2020-09-20 – 2020-09-22 (×4): 500 mg via ORAL
  Filled 2020-09-20 (×4): qty 1

## 2020-09-20 MED ORDER — SODIUM CHLORIDE 0.9 % IV SOLN: INTRAVENOUS | Status: DC

## 2020-09-20 NOTE — Evaluation (Signed)
Physical Therapy Evaluation Patient Details Name: Tricia Bridges MRN: [id number] DOB: March 17, 1966 Today's Date: 09/20/2020   History of Present Illness  Pt is a 55 y.o. female who presented 6/6 with seizure activity. ETT 6/6-6/8. MRI of head revealed a 1.2 cm focus of diffusion abnormality involving the left caudate, consistent with an acute to early subacute ischemic infarct. MRA revealed severe stenosis of the left A1 and M1 segments, with considerable irregularity of the M1 segment. PMH: DM, HTN.   Clinical Impression  Pt presents with condition above and deficits mentioned below, see PT Problem List. PTA, she was living with her adult daughter in a 1-level house with 5 STE with bil handrails at the front entrance. Pt was functioning independently and working at [employer]. Currently, pt is requiring min guard-minA for transfers and ambulation on level surfaces up to ~150 ft without UE support. She is displaying some possible expressive language deficits. Also, pt is demonstrating some mild weakness in her R hip flexors and anterior tibialis muscles compared to her L along with deficits in coordination (possible dysdiadochokinesia in her R leg), cognition, and balance that place her at risk for falls and injuries. Pt will need 24/7 assistance at home initially to ensure her safety as she is having some STM deficits and difficulty path finding. Pt is at high risk for falls, indicating by her DGI score of 15 this date. She would benefit from follow-up at an Outpatient PT clinic to maximize her safety and independence with all functional mobility, ideally a neuro specialty clinic. Will continue to follow acutely.     Follow Up Recommendations Outpatient PT;Supervision/Assistance - 24 hour (neuro specialty ideally; 24/7 initially until improves)    Equipment Recommendations  Other (comment) (shower chair)    Recommendations for Other Services       Precautions / Restrictions  Precautions Precautions: Fall Restrictions Weight Bearing Restrictions: No      Mobility  Bed Mobility Overal bed mobility: Modified Independent             General bed mobility comments: Pt able to perform all bed mobility safely with mod I.    Transfers Overall transfer level: Needs assistance Equipment used: Rolling walker (2 wheeled) Transfers: Sit to/from Stand Sit to Stand: Min assist         General transfer comment: MinA to steady as pt displaying a moderate trunk sway when powering up to stand.  Ambulation/Gait Ambulation/Gait assistance: Min guard;Min assist Gait Distance (Feet): 150 Feet Assistive device: Rolling walker (2 wheeled);None Gait Pattern/deviations: Step-through pattern;Decreased stride length;Narrow base of support;Scissoring Gait velocity: reduced Gait velocity interpretation: >4.37 ft/sec, indicative of normal walking speed (when cued to increase speed) General Gait Details: Took several steps with RW initially and quickly progressed to no UE support. Pt with slow gait speed and decreased stride length, displaying R ankle lateral instability in stance and narrow BOS with intermittent scissoring of legs, primarily when turning. Pt with intermittent bouts of LOB needing up to minA to recover, but majority of time needing min guard assist for safety.  Stairs            Wheelchair Mobility    Modified Rankin (Stroke Patients Only) Modified Rankin (Stroke Patients Only) Pre-Morbid Rankin Score: No symptoms Modified Rankin: Moderately severe disability     Balance Overall balance assessment: Needs assistance Sitting-balance support: No upper extremity supported;Feet supported Sitting balance-Leahy Scale: Good     Standing balance support: No upper extremity supported;During functional activity Standing balance-Leahy Scale:  Good Standing balance comment: Able to ambulate and able to reach off BOS to ground and across midline moderately  with min guard-minA.                 Standardized Balance Assessment Standardized Balance Assessment : Dynamic Gait Index   Dynamic Gait Index Level Surface: Mild Impairment Change in Gait Speed: Normal Gait with Horizontal Head Turns: Moderate Impairment Gait with Vertical Head Turns: Moderate Impairment Gait and Pivot Turn: Mild Impairment Step Over Obstacle: Mild Impairment Step Around Obstacles: Mild Impairment Steps: Mild Impairment (assumed, not tested) Total Score: 15       Pertinent Vitals/Pain Pain Assessment: No/denies pain    Home Living Family/patient expects to be discharged to:: Private residence Living Arrangements: Children (adult daughter) Available Help at Discharge: Family;Available 24 hours/day Type of Home: House Home Access: Stairs to enter Entrance Stairs-Rails: Can reach both (on front stairs, no rails in back entrance) Entrance Stairs-Number of Steps: 2-5 (5 stairs in front and 2 in back) Home Layout: One level Home Equipment: None      Prior Function Level of Independence: Independent         Comments: 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     Hand Dominance   Dominant Hand: Right    Extremity/Trunk Assessment   Upper Extremity Assessment Upper Extremity Assessment: Defer to OT evaluation    Lower Extremity Assessment Lower Extremity Assessment: RLE deficits/detail;LLE deficits/detail RLE Deficits / Details: Slight weakness with MMT score of 4 in R hip flexors and anterior tibialis muscles compared to L scoring 4+ to 5 RLE Sensation: WNL RLE Coordination: decreased fine motor;decreased gross motor (possible dysdiadochokinesia noted) LLE Deficits / Details: MMT scores of 4+ to 5 grossly LLE Sensation: WNL LLE Coordination: decreased gross motor    Cervical / Trunk Assessment Cervical / Trunk Assessment: Normal  Communication   Communication: No difficulties  Cognition Arousal/Alertness:  Awake/alert Behavior During Therapy: WFL for tasks assessed/performed Overall Cognitive Status: Impaired/Different from baseline Area of Impairment: Attention;Memory;Following commands;Safety/judgement;Awareness;Problem solving                   Current Attention Level: Focused Memory: Decreased short-term memory Following Commands: Follows multi-step commands with increased time;Follows multi-step commands inconsistently Safety/Judgement: Decreased awareness of safety;Decreased awareness of deficits Awareness: Emergent Problem Solving: Slow processing;Requires verbal cues General Comments: Pt having difficulty finding word "2022" to state current year with her repeatedly stating "2", but then able to identify it was the correct year. Difficulty following multi-step commands with noted slow processing. Pt with inability to read clock, even when provided a list of options for the time, but denies visual issues (possibly poor awareness). Pt also with some memory deficits, unable to retrack path back to room without cues to look for familiar family members.      General Comments General comments (skin integrity, edema, etc.): HR up to 138 bpm with gait    Exercises     Assessment/Plan    PT Assessment Patient needs continued PT services  PT Problem List Decreased strength;Decreased activity tolerance;Decreased balance;Decreased mobility;Decreased coordination;Decreased cognition;Decreased safety awareness       PT Treatment Interventions DME instruction;Gait training;Stair training;Functional mobility training;Therapeutic activities;Therapeutic exercise;Balance training;Neuromuscular re-education;Cognitive remediation;Patient/family education    PT Goals (Current goals can be found in the Care Plan section)  Acute Rehab PT Goals Patient Stated Goal: to get better and return to work PT Goal Formulation: With patient/family Time For Goal Achievement: 10/04/20 Potential to  Achieve  Goals: Good    Frequency Min 4X/week   Barriers to discharge        Co-evaluation               AM-PAC PT "6 Clicks" Mobility  Outcome Measure Help needed turning from your back to your side while in a flat bed without using bedrails?: None Help needed moving from lying on your back to sitting on the side of a flat bed without using bedrails?: None Help needed moving to and from a bed to a chair (including a wheelchair)?: A Little Help needed standing up from a chair using your arms (e.g., wheelchair or bedside chair)?: A Little Help needed to walk in hospital room?: A Little Help needed climbing 3-5 steps with a railing? : A Little 6 Click Score: 20    End of Session Equipment Utilized During Treatment: Gait belt Activity Tolerance: Patient tolerated treatment well Patient left: in bed;with call bell/phone within reach;with bed alarm set;with family/visitor present Nurse Communication: Mobility status PT Visit Diagnosis: Unsteadiness on feet (R26.81);Other abnormalities of gait and mobility (R26.89);Muscle weakness (generalized) (M62.81);Difficulty in walking, not elsewhere classified (R26.2);Other symptoms and signs involving the nervous system (R29.898)    Time: 2947-6546 PT Time Calculation (min) (ACUTE ONLY): 41 min   Charges:   PT Evaluation $PT Eval Moderate Complexity: 1 Mod PT Treatments $Gait Training: 8-22 mins $Therapeutic Activity: 8-22 mins        Raymond Gurney, PT, DPT Acute Rehabilitation Services  Pager: [phone] Office: [phone]   Jewel Baize 09/20/2020, 6:01 PM

## 2020-09-20 NOTE — Progress Notes (Signed)
Subjective:   ROS: negative except above  Examination  Vital signs in last 24 hours: Temp:  [98.1 F (36.7 C)-98.9 F (37.2 C)] 98.5 F (36.9 C) (06/10 0744) Pulse Rate:  [75-97] 75 (06/10 0744) Resp:  [10-18] 10 (06/10 0744) BP: (124-170)/(80-97) 149/97 (06/10 1006) SpO2:  [97 %-98 %] 98 % (06/10 0744)  General: lying in bed, not in apparent distress CVS: pulse-normal rate and rhythm RS: Symmetric expansion, CTAB Extremities: normal, warm   Neuro: MS: Alert, oriented to place and person only, not to time, follows commands CN: pupils equal and reactive,  EOMI, face symmetric, tongue midline, normal sensation over face Motor: 5/5 strength in all 4 extremities Reflexes: 2+ bilaterally over patella, biceps, plantars: flexor Coordination: normal  Basic Metabolic Panel: Recent Labs  Lab 09/17/20 0336 09/17/20 0416 09/17/20 2300 09/18/20 0546 09/18/20 0935 09/18/20 1635 09/19/20 0322 09/20/20 0500  NA 145   < > 141 146* 143 141 138 138  K 3.9   < > 3.5 4.1 3.3* 3.2* 3.4* 5.1  CL 109   < > 117* 127* 109  --  99 106  CO2 22   < > 19* 15* 23  --  24 22  GLUCOSE 195*   < > 172* 77 143*  --  135* 82  BUN 13   < > 8 5* 6  --  5* 7  CREATININE 0.91   < > 0.67 0.44 0.68  --  0.71 0.56  CALCIUM 9.5   < > 7.3* 5.4* 8.8*  --  9.0 8.2*  MG 2.3  --   --  1.9 1.8  --  1.8 1.7  PHOS 2.3*  --   --  3.2 3.8  --  4.5 3.1   < > = values in this interval not displayed.    CBC: Recent Labs  Lab 09/16/20 1748 09/16/20 1809 09/17/20 0336 09/17/20 0416 09/17/20 1719 09/18/20 0546 09/18/20 1635 09/19/20 0322 09/20/20 0500  WBC 20.0*  --  18.9*  --   --  14.8*  --  12.7* 7.6  NEUTROABS 18.2*  --   --   --   --   --   --   --   --   HGB 15.5*   < > 16.0*   < > 13.3 13.5 14.3 14.6 14.3  HCT 49.1*   < > 47.1*   < > 39.0 40.5 42.0 44.4 43.1  MCV 90.9  --  86.1  --   --  87.9  --  88.4 88.3  PLT 246  --  204  --   --  165  --  172 171   < > = values in this interval not displayed.      Coagulation Studies: No results for input(s): LABPROT, INR in the last 72 hours.  Imaging  NO new imaging overnight  ASSESSMENT AND PLAN: 55 year old female who presented with status epilepticus in the setting nonketotic hyperglycemia.   Status epilepticus (resolved) Acute encephalopathy, resolved Left caudate acute ischemic stroke Elevated CK -Likely provoked status epilepticus in the setting of hyperglycemia -May have restricted diffusion on MRI could be an acute stroke versus restricted diffusion due to hyperglycemia   Recommendations - Start patient on aspirin 81 mg and plavix 75mg  daily for secondary stroke prevention. Plavix for 3 months only - Patient had elevated CK therefore will hold atorvastatin for now. Once CK downtrending, start atorvastatin 40mg  daily - Recommend Keppra 500 mg twice daily for maintenance -TTE still pending,  called lab to expedite.  If TTE is normal, can be discharged from neurology standpoint - Continue seizure precautions including do not drive for 6 months.   -Discussed stroke preventative measures, seizure provocative measures with patient and family at bedside - Recommend follow-up with neurology in 3 months   I have spent a total of  35 minutes with the patient reviewing hospital notes, test results, labs and examining the patient as well as establishing an assessment and plan that was discussed personally with the patient's family at bedside.  > 50% of time was spent in direct patient care.    Lindie Spruce Epilepsy [facility] For questions after 5pm please refer to AMION to reach the Neurologist on call

## 2020-09-20 NOTE — Progress Notes (Signed)
  Echocardiogram 2D Echocardiogram has been performed.  Pieter Partridge 09/20/2020, 1:39 PM

## 2020-09-20 NOTE — Progress Notes (Addendum)
PROGRESS NOTE    Tricia Bridges   ZOX:[id number]  DOB: 05/18/1965  DOA: 09/16/2020 PCP: Pcp, No   Brief Narrative:  Tricia Bridges is a 55 year old female who presented to [facility] in status epilepticus.  No prior history of seizures.  EMS witnessed 2 episodes of seizures in addition to at least 1 seizure at home.  The patient was given Versed.  In the ED the patient was noted to seize twice.  She was intubated and admitted to the ICU.  Propofol used for seizure suppression. In the ED CBG was in the 600s, WBC count was 20.  She had acute respiratory failure with hypercarbia (pH 6.9 and PCO2 103), CBG of 623, lactic acidosis with a level of 5.7, fever of 100. Started on an insulin infusion.   Subjective: She admits today that she has not been taking her medications recently and has no reason for why.  She states that she will take them from here on.  I have discussed her severely uncontrolled diabetes and the high risk for strokes and heart attacks and other organ damage in the presence of her daughters.    Assessment & Plan:   Principal Problem:   Status epilepticus /acute/subacute CVA -Appreciate neurology management -Status post LTM -Now on 500 mg of Keppra twice daily per neurology- -MRI performed on 6/8> 1.2 cm focus of diffusion abnormality involving the left caudate, consistent with an acute to early subacute ischemic infarct - MRA head and neck> Intracranial MR angiography shows severe stenosis of the left A1 and M1 segments -I suspect seizures may be secondary to the CVA -2D echo not reveal a thrombus-she has grade 1 diastolic dysfunction-full report below -She has been restarted on aspirin 81 mg and Plavix 75 mg daily.  Plavix should be discontinued in 3 months. -Follow-up with neurology recommended in 3 months - discussed plan with neurology  Active Problems: Essential hypertension -Continue to allow for permissive hypertension today -Continue amlodipine -  enalapril and HCTZ being held for now  Type 2 diabetes mellitus , uncontrolled with hyperglycemia with long-term current use of insulin  -HBA1c > 15.5when she was admitted -She takes Lantus and Glucophage as outpatient -Continue Lantus and sliding scale insulin at this time and follow glucose levels    Acute respiratory failure with hypercapnia-vent dependent respiratory failure ?  Aspiration pneumonia secondary to seizures fever on admission, leukocytosis and right sided infiltrate/atelectasis on xray --5 day course ceftriaxone will be completed today- follow off antibiotics    Acute metabolic encephalopathy - alert and has even eaten but is very slow to respond- may be postictal  Hypokalemia - Aggressively replaced    Non-traumatic rhabdomyolysis -CK 6709 > 15, 801> 10, 234-  -continue IV fluids to prevent AKI  Time spent in minutes: 35 DVT prophylaxis: heparin injection 5,000 Units Start: 09/16/20 2200 SCDs Start: 09/16/20 1929 Code Status: Full code Family Communication: daughters Level of Care: Level of care: Med-Surg Disposition Plan:  Status is: Inpatient  Remains inpatient appropriate because:IV treatments appropriate due to intensity of illness or inability to take PO  Dispo: The patient is from: Home              Anticipated d/c is to: Home              Patient currently is not medically stable to d/c.   Difficult to place patient No      Consultants:  Neurology PCCM Procedures:  Intubation Antimicrobials:  Anti-infectives (From admission, onward)  Start     Dose/Rate Route Frequency Ordered Stop   09/18/20 0800  cefTRIAXone (ROCEPHIN) 2 g in sodium chloride 0.9 % 100 mL IVPB        2 g 200 mL/hr over 30 Minutes Intravenous Every 24 hours 09/17/20 0950 09/22/20 0759   09/16/20 2200  cefTRIAXone (ROCEPHIN) 2 g in sodium chloride 0.9 % 100 mL IVPB  Status:  Discontinued        2 g 200 mL/hr over 30 Minutes Intravenous Every 12 hours 09/16/20 2057  09/17/20 0950        Objective: Vitals:   09/20/20 0310 09/20/20 0744 09/20/20 1006 09/20/20 1131  BP: (!) 155/92 (!) 148/93 (!) 149/97 (!) 144/92  Pulse: 95 75  99  Resp: 18 10  17   Temp: 98.1 F (36.7 C) 98.5 F (36.9 C)  98.6 F (37 C)  TempSrc: Oral Oral  Oral  SpO2: 97% 98%  97%  Weight:      Height:        Intake/Output Summary (Last 24 hours) at 09/20/2020 1409 Last data filed at 09/20/2020 0916 Gross per 24 hour  Intake 300 ml  Output 400 ml  Net -100 ml    Filed Weights   09/16/20 1920 09/18/20 0500 09/19/20 0451  Weight: 72.7 kg 66.9 kg 61.8 kg    Examination: General exam: Appears comfortable  HEENT: PERRLA, oral mucosa moist, no sclera icterus or thrush Respiratory system: Clear to auscultation. Respiratory effort normal. Cardiovascular system: S1 & S2 heard, regular rate and rhythm Gastrointestinal system: Abdomen soft, non-tender, nondistended. Normal bowel sounds   Central nervous system: Alert and oriented. No focal neurological deficits. Extremities: No cyanosis, clubbing or edema Skin: No rashes or ulcers Psychiatry:  Mood & affect appropriate.      Data Reviewed: I have personally reviewed following labs and imaging studies  CBC: Recent Labs  Lab 09/16/20 1748 09/16/20 1809 09/17/20 0336 09/17/20 0416 09/17/20 1719 09/18/20 0546 09/18/20 1635 09/19/20 0322 09/20/20 0500  WBC 20.0*  --  18.9*  --   --  14.8*  --  12.7* 7.6  NEUTROABS 18.2*  --   --   --   --   --   --   --   --   HGB 15.5*   < > 16.0*   < > 13.3 13.5 14.3 14.6 14.3  HCT 49.1*   < > 47.1*   < > 39.0 40.5 42.0 44.4 43.1  MCV 90.9  --  86.1  --   --  87.9  --  88.4 88.3  PLT 246  --  204  --   --  165  --  172 171   < > = values in this interval not displayed.    Basic Metabolic Panel: Recent Labs  Lab 09/17/20 0336 09/17/20 0416 09/17/20 2300 09/18/20 0546 09/18/20 0935 09/18/20 1635 09/19/20 0322 09/20/20 0500  NA 145   < > 141 146* 143 141 138 138  K  3.9   < > 3.5 4.1 3.3* 3.2* 3.4* 5.1  CL 109   < > 117* 127* 109  --  99 106  CO2 22   < > 19* 15* 23  --  24 22  GLUCOSE 195*   < > 172* 77 143*  --  135* 82  BUN 13   < > 8 5* 6  --  5* 7  CREATININE 0.91   < > 0.67 0.44 0.68  --  0.71 0.56  CALCIUM 9.5   < > 7.3* 5.4* 8.8*  --  9.0 8.2*  MG 2.3  --   --  1.9 1.8  --  1.8 1.7  PHOS 2.3*  --   --  3.2 3.8  --  4.5 3.1   < > = values in this interval not displayed.    GFR: Estimated Creatinine Clearance: 65 mL/min (by C-G formula based on SCr of 0.56 mg/dL). Liver Function Tests: Recent Labs  Lab 09/16/20 1748  AST 32  ALT 29  ALKPHOS 135*  BILITOT 0.5  PROT 7.4  ALBUMIN 3.7    No results for input(s): LIPASE, AMYLASE in the last 168 hours. Recent Labs  Lab 09/16/20 2336  AMMONIA 27    Coagulation Profile: No results for input(s): INR, PROTIME in the last 168 hours. Cardiac Enzymes: Recent Labs  Lab 09/16/20 2336 09/17/20 0336 09/18/20 0546 09/19/20 1505 09/20/20 0500  CKTOTAL 1,182* 2,182* 6,709* 15,801* 10,234*    BNP (last 3 results) No results for input(s): PROBNP in the last 8760 hours. HbA1C: Recent Labs    09/17/20 2125  HGBA1C >15.5*    CBG: Recent Labs  Lab 09/19/20 1203 09/19/20 1559 09/19/20 2200 09/20/20 0741 09/20/20 1129  GLUCAP 90 131* 142* 71 269*    Lipid Profile: Recent Labs    09/19/20 1259  CHOL 245*  HDL 47  LDLCALC 179*  TRIG 97  CHOLHDL 5.2    Thyroid Function Tests: No results for input(s): TSH, T4TOTAL, FREET4, T3FREE, THYROIDAB in the last 72 hours. Anemia Panel: No results for input(s): VITAMINB12, FOLATE, FERRITIN, TIBC, IRON, RETICCTPCT in the last 72 hours. Urine analysis:    Component Value Date/Time   COLORURINE STRAW (A) 06/15/2016 1711   APPEARANCEUR CLEAR 06/15/2016 1711   LABSPEC 1.026 06/15/2016 1711   PHURINE 5.0 06/15/2016 1711   GLUCOSEU >=500 (A) 06/15/2016 1711   HGBUR NEGATIVE 06/15/2016 1711   BILIRUBINUR NEGATIVE 06/15/2016 1711    KETONESUR 5 (A) 06/15/2016 1711   PROTEINUR NEGATIVE 06/15/2016 1711   NITRITE NEGATIVE 06/15/2016 1711   LEUKOCYTESUR NEGATIVE 06/15/2016 1711   Sepsis Labs: (procalcitonin:4,lacticidven:4) ) Recent Results (from the past 240 hour(s))  Culture, blood (routine x 2)     Status: None (Preliminary result)   Collection Time: 09/16/20  5:48 PM   Specimen: BLOOD  Result Value Ref Range Status   Specimen Description BLOOD BLOOD RIGHT FOREARM  Final   Special Requests   Final    BOTTLES DRAWN AEROBIC AND ANAEROBIC Blood Culture results may not be optimal due to an inadequate volume of blood received in culture bottles   Culture   Final    NO GROWTH 4 DAYS Performed at [facility], [address], [place], [place] [address]    Report Status PENDING  Incomplete  SARS CORONAVIRUS 2 (TAT 6-24 HRS) Nasopharyngeal Nasopharyngeal Swab     Status: None   Collection Time: 09/16/20  6:39 PM   Specimen: Nasopharyngeal Swab  Result Value Ref Range Status   SARS Coronavirus 2 NEGATIVE NEGATIVE Final    Comment: (NOTE) SARS-CoV-2 target nucleic acids are NOT DETECTED.  The SARS-CoV-2 RNA is generally detectable in upper and lower respiratory specimens during the acute phase of infection. Negative results do not preclude SARS-CoV-2 infection, do not rule out co-infections with other pathogens, and should not be used as the sole basis for treatment or other patient management decisions. Negative results must be combined with clinical observations, patient history, and epidemiological information. The  expected result is Negative.  Fact Sheet for Patients: HairSlick.no  Fact Sheet for Healthcare Providers: quierodirigir.com  This test is not yet approved or cleared by the [place] FDA and  has been authorized for detection and/or diagnosis of SARS-CoV-2 by FDA under an Emergency Use Authorization (EUA). This EUA will  remain  in effect (meaning this test can be used) for the duration of the COVID-19 declaration under Se ction 564(b)(1) of the Act, 21 U.S.C. section 360bbb-3(b)(1), unless the authorization is terminated or revoked sooner.  Performed at [facility], [address] [place], [place] [address]   Culture, blood (routine x 2)     Status: None (Preliminary result)   Collection Time: 09/16/20  9:20 PM   Specimen: BLOOD  Result Value Ref Range Status   Specimen Description BLOOD SITE NOT SPECIFIED  Final   Special Requests   Final    BOTTLES DRAWN AEROBIC AND ANAEROBIC Blood Culture results may not be optimal due to an inadequate volume of blood received in culture bottles   Culture   Final    NO GROWTH 4 DAYS Performed at [facility], [address] [place], [place] [address]    Report Status PENDING  Incomplete  MRSA PCR Screening     Status: None   Collection Time: 09/16/20 10:28 PM   Specimen: Nasal Mucosa; Nasopharyngeal  Result Value Ref Range Status   MRSA by PCR NEGATIVE NEGATIVE Final    Comment:        The GeneXpert MRSA Assay (FDA approved for NASAL specimens only), is one component of a comprehensive MRSA colonization surveillance program. It is not intended to diagnose MRSA infection nor to guide or monitor treatment for MRSA infections. Performed at [facility], [address] [place], [place] [address]          Radiology Studies: MR ANGIO HEAD WO CONTRAST  Result Date: 09/19/2020 CLINICAL DATA:  Follow-up acute infarction left caudate and left parietal cortex EXAM: MRA HEAD WITHOUT CONTRAST MRA NECK WITHOUT CONTRAST TECHNIQUE: Angiographic images of the Circle of Willis were obtained using MRA technique without intravenous contrast. Angiographic images of the neck were obtained using MRA technique without intravenous contrast. Carotid stenosis measurements (when applicable) are obtained utilizing NASCET criteria, using the distal internal  carotid diameter as the denominator. COMPARISON:  MRI yesterday. FINDINGS: MRA HEAD FINDINGS Both internal carotid arteries widely patent through the skull base and siphon regions. On the right, the anterior and middle cerebral arteries are patent and normal. On the left, there is moderate to severe stenosis at both the A1 and M1 segments, with considerable irregularity particularly of the M1 segment. The patient would be at risk of additional infarction in these vascular territories. Both vertebral arteries widely patent to the basilar. No basilar stenosis. Posterior circulation branch vessels are normal. Left PCA takes fetal origin from the anterior circulation. Large posterior communicating artery on the right. MRA NECK FINDINGS Branching pattern from the arch is normal. Both common carotid arteries are widely patent to the bifurcation. Both carotid bifurcations are widely patent. There may be minimal nonstenotic plaque at the ICA bulb on the left. Both vertebral arteries widely patent from their origins to the basilar. IMPRESSION: No advanced finding in the neck. Minimal atherosclerotic plaque at the ICA bulb on the left but without stenosis. Intracranial MR angiography shows severe stenosis of the left A1 and M1 segments, with considerable irregularity of the M1 segment. Patient would be at  risk of additional infarction in these vascular territories. Electronically Signed   By: Paulina Fusi M.D.   On: 09/19/2020 15:55   MR ANGIO NECK WO CONTRAST  Result Date: 09/19/2020 CLINICAL DATA:  Follow-up acute infarction left caudate and left parietal cortex EXAM: MRA HEAD WITHOUT CONTRAST MRA NECK WITHOUT CONTRAST TECHNIQUE: Angiographic images of the Circle of Willis were obtained using MRA technique without intravenous contrast. Angiographic images of the neck were obtained using MRA technique without intravenous contrast. Carotid stenosis measurements (when applicable) are obtained utilizing NASCET criteria,  using the distal internal carotid diameter as the denominator. COMPARISON:  MRI yesterday. FINDINGS: MRA HEAD FINDINGS Both internal carotid arteries widely patent through the skull base and siphon regions. On the right, the anterior and middle cerebral arteries are patent and normal. On the left, there is moderate to severe stenosis at both the A1 and M1 segments, with considerable irregularity particularly of the M1 segment. The patient would be at risk of additional infarction in these vascular territories. Both vertebral arteries widely patent to the basilar. No basilar stenosis. Posterior circulation branch vessels are normal. Left PCA takes fetal origin from the anterior circulation. Large posterior communicating artery on the right. MRA NECK FINDINGS Branching pattern from the arch is normal. Both common carotid arteries are widely patent to the bifurcation. Both carotid bifurcations are widely patent. There may be minimal nonstenotic plaque at the ICA bulb on the left. Both vertebral arteries widely patent from their origins to the basilar. IMPRESSION: No advanced finding in the neck. Minimal atherosclerotic plaque at the ICA bulb on the left but without stenosis. Intracranial MR angiography shows severe stenosis of the left A1 and M1 segments, with considerable irregularity of the M1 segment. Patient would be at risk of additional infarction in these vascular territories. Electronically Signed   By: Paulina Fusi M.D.   On: 09/19/2020 15:55   MR BRAIN WO CONTRAST  Result Date: 09/19/2020 CLINICAL DATA:  Initial evaluation for acute seizure, abnormal neuro exam. EXAM: MRI HEAD WITHOUT CONTRAST TECHNIQUE: Multiplanar, multiecho pulse sequences of the brain and surrounding structures were obtained without intravenous contrast. COMPARISON:  Prior CT from 09/16/2020. FINDINGS: Brain: Cerebral volume within normal limits for age. Minimal T2/FLAIR hyperintensity noted involving the periventricular white matter,  nonspecific, but most likely related to chronic microvascular ischemic disease, extremely mild for age. 1.2 cm focus of diffusion abnormality seen involving the left caudate, consistent with an acute to early subacute ischemic infarct (series 5, image 83). No associated hemorrhage or mass effect. No other diffusion abnormality to suggest acute or subacute ischemia or changes related to seizure. Gray-white matter differentiation otherwise maintained. No encephalomalacia to suggest chronic cortical infarction elsewhere within the brain. No other evidence for acute or chronic intracranial hemorrhage. No mass lesion, midline shift or mass effect. No hydrocephalus or extra-axial fluid collection. Pituitary gland suprasellar region within normal limits. Midline structures intact. No intrinsic temporal lobe abnormality. Vascular: Major intracranial vascular flow voids are maintained. Skull and upper cervical spine: Craniocervical junction within normal limits. Bone marrow signal intensity within normal limits. Susceptibility artifact emanates from the left occipital scalp. Scalp soft tissues demonstrate no other acute finding. Sinuses/Orbits: Globes and orbital soft tissues demonstrate no acute finding. Paranasal sinuses are largely clear. Small to moderate bilateral mastoid effusions. Visualized nasopharynx within normal limits. Other: None. IMPRESSION: 1. 1.2 cm focus of diffusion abnormality involving the left caudate, consistent with an acute to early subacute ischemic infarct. No associated hemorrhage or mass effect. 2.  No other acute intracranial abnormality. Electronically Signed   By: Rise Mu M.D.   On: 09/19/2020 00:58   ECHOCARDIOGRAM COMPLETE  Result Date: 09/20/2020    ECHOCARDIOGRAM REPORT   Patient Name:   BRISTYN KULESZA Date of Exam: 09/20/2020 Medical Rec #:  [id number]      Height:       63.0 in Accession #:    [id number]     Weight:       136.2 lb Date of Birth:  1965/09/24       BSA:           1.643 m Patient Age:    54 years       BP:           144/92 mmHg Patient Gender: F              HR:           99 bpm. Exam Location:  Inpatient Procedure: 2D Echo, Cardiac Doppler and Color Doppler Indications:    CVA  History:        Patient has no prior history of Echocardiogram examinations.                 Signs/Symptoms:Fever. Epilepsy.  Sonographer:    Lavenia Atlas Referring Phys: [id number] PRIYANKA O YADAV IMPRESSIONS  1. Left ventricular ejection fraction, by estimation, is 60 to 65%. The left ventricle has normal function. The left ventricle has no regional wall motion abnormalities. There is mild concentric left ventricular hypertrophy of the septal and basal-septal segments. Left ventricular diastolic parameters are consistent with Grade I diastolic dysfunction (impaired relaxation).  2. Right ventricular systolic function is normal. The right ventricular size is normal. Tricuspid regurgitation signal is inadequate for assessing PA pressure.  3. The mitral valve is normal in structure. No evidence of mitral valve regurgitation. No evidence of mitral stenosis.  4. The aortic valve is tricuspid. Aortic valve regurgitation is not visualized. Mild to moderate aortic valve sclerosis/calcification is present, without any evidence of aortic stenosis.  5. The inferior vena cava is normal in size with greater than 50% respiratory variability, suggesting right atrial pressure of 3 mmHg. FINDINGS  Left Ventricle: Left ventricular ejection fraction, by estimation, is 60 to 65%. The left ventricle has normal function. The left ventricle has no regional wall motion abnormalities. The left ventricular internal cavity size was normal in size. There is  mild concentric left ventricular hypertrophy of the septal and basal-septal segments. Left ventricular diastolic parameters are consistent with Grade I diastolic dysfunction (impaired relaxation). Normal left ventricular filling pressure. Right Ventricle: The right  ventricular size is normal. No increase in right ventricular wall thickness. Right ventricular systolic function is normal. Tricuspid regurgitation signal is inadequate for assessing PA pressure. Left Atrium: Left atrial size was normal in size. Right Atrium: Right atrial size was normal in size. Pericardium: There is no evidence of pericardial effusion. Mitral Valve: The mitral valve is normal in structure. No evidence of mitral valve regurgitation. No evidence of mitral valve stenosis. Tricuspid Valve: The tricuspid valve is normal in structure. Tricuspid valve regurgitation is not demonstrated. No evidence of tricuspid stenosis. Aortic Valve: The aortic valve is tricuspid. Aortic valve regurgitation is not visualized. Mild to moderate aortic valve sclerosis/calcification is present, without any evidence of aortic stenosis. Pulmonic Valve: The pulmonic valve was normal in structure. Pulmonic valve regurgitation is not visualized. No evidence of pulmonic stenosis. Aorta: The aortic root is normal in size and structure. Venous:  The inferior vena cava is normal in size with greater than 50% respiratory variability, suggesting right atrial pressure of 3 mmHg. IAS/Shunts: No atrial level shunt detected by color flow Doppler.  LEFT VENTRICLE PLAX 2D LVIDd:         4.10 cm      Diastology LVIDs:         2.80 cm      LV e' medial:    5.22 cm/s LV PW:         1.20 cm      LV E/e' medial:  12.9 LV IVS:        1.30 cm      LV e' lateral:   6.31 cm/s LVOT diam:     2.10 cm      LV E/e' lateral: 10.7 LV SV:         61 LV SV Index:   37 LVOT Area:     3.46 cm  LV Volumes (MOD) LV vol d, MOD A4C: 104.0 ml LV vol s, MOD A4C: 27.9 ml LV SV MOD A4C:     104.0 ml RIGHT VENTRICLE RV Basal diam:  2.80 cm RV S prime:     10.40 cm/s TAPSE (M-mode): 1.6 cm LEFT ATRIUM             Index       RIGHT ATRIUM          Index LA diam:        3.20 cm 1.95 cm/m  RA Area:     8.29 cm LA Vol (A2C):   29.5 ml 17.96 ml/m RA Volume:   14.70 ml  8.95 ml/m LA Vol (A4C):   39.8 ml 24.23 ml/m LA Biplane Vol: 34.5 ml 21.00 ml/m  AORTIC VALVE LVOT Vmax:   99.00 cm/s LVOT Vmean:  68.000 cm/s LVOT VTI:    0.175 m  AORTA Ao Root diam: 2.50 cm MITRAL VALVE MV Area (PHT): 3.85 cm     SHUNTS MV Decel Time: 197 msec     Systemic VTI:  0.18 m MV E velocity: 67.40 cm/s   Systemic Diam: 2.10 cm MV A velocity: 111.00 cm/s MV E/A ratio:  0.61 Gloris Manchester Turner MD Electronically signed by Armanda Magic MD Signature Date/Time: 09/20/2020/1:51:44 PM    Final       Scheduled Meds:  amLODipine  5 mg Oral Daily   aspirin  81 mg Oral Daily   Chlorhexidine Gluconate Cloth  6 each Topical Daily   clopidogrel  75 mg Oral Daily   fentaNYL (SUBLIMAZE) injection  50 mcg Intravenous Once   heparin  5,000 Units Subcutaneous Q8H   insulin aspart  0-15 Units Subcutaneous TID WC   insulin glargine  14 Units Subcutaneous BID   levETIRAcetam  500 mg Oral BID   mouth rinse  15 mL Mouth Rinse BID   polyethylene glycol  17 g Oral Daily   sodium chloride flush  10-40 mL Intracatheter Q12H   [START ON 09/25/2020] thiamine injection  100 mg Intravenous Daily   Continuous Infusions:  sodium chloride     cefTRIAXone (ROCEPHIN)  IV 2 g (09/20/20 0912)   thiamine injection 250 mg (09/20/20 1159)     LOS: 4 days      Calvert Cantor, MD [facility] Pager: [email or web address] 09/20/2020, 2:09 PM

## 2020-09-20 NOTE — Telephone Encounter (Signed)
Patient was seen in the hospital for elevated BP. Daughter wants to know if patient can be worked in next week. Last seen 02/2018.

## 2020-09-20 NOTE — Telephone Encounter (Signed)
It appears she is currently in the hospital for seizures not for high blood pressure.  I don't see any plans for discharge at this time.

## 2020-09-21 LAB — GLUCOSE, CAPILLARY
Glucose-Capillary: 84 mg/dL (ref 70–99)
Glucose-Capillary: 257 mg/dL — ABNORMAL HIGH (ref 70–99)
Glucose-Capillary: 148 mg/dL — ABNORMAL HIGH (ref 70–99)
Glucose-Capillary: 131 mg/dL — ABNORMAL HIGH (ref 70–99)

## 2020-09-21 LAB — CULTURE, BLOOD (ROUTINE X 2)
Culture: NO GROWTH
Culture: NO GROWTH

## 2020-09-21 LAB — BASIC METABOLIC PANEL
Anion gap: 11 (ref 5–15)
BUN: 8 mg/dL (ref 6–20)
CO2: 23 mmol/L (ref 22–32)
Calcium: 8.9 mg/dL (ref 8.9–10.3)
Chloride: 104 mmol/L (ref 98–111)
Creatinine, Ser: 0.71 mg/dL (ref 0.44–1.00)
GFR, Estimated: >60 mL/min (ref >60)
Glucose, Bld: 129 mg/dL — ABNORMAL HIGH (ref 70–99)
Potassium: 3.9 mmol/L (ref 3.5–5.1)
Sodium: 138 mmol/L (ref 135–145)

## 2020-09-21 LAB — CK: Total CK: 9695 U/L — ABNORMAL HIGH (ref 38–234)

## 2020-09-21 MED ORDER — BENZONATATE 100 MG PO CAPS
100.0000 mg | ORAL_CAPSULE | Freq: Three times a day (TID) | ORAL | Status: DC | PRN
  Administered 2020-09-21: 100 mg via ORAL
  Filled 2020-09-21: qty 1

## 2020-09-21 MED ORDER — ENALAPRIL MALEATE 5 MG PO TABS
10.0000 mg | ORAL_TABLET | Freq: Every day | ORAL | Status: DC
  Administered 2020-09-21 – 2020-09-22 (×2): 10 mg via ORAL
  Filled 2020-09-21 (×2): qty 2

## 2020-09-21 NOTE — Progress Notes (Signed)
Physical Therapy Treatment Patient Details Name: Tricia Bridges MRN: [id number] DOB: Jun 01, 1965 Today's Date: 09/21/2020    History of Present Illness Pt is a 55 y.o. female who presented 6/6 with seizure activity. ETT 6/6-6/8. MRI of head revealed a 1.2 cm focus of diffusion abnormality involving the left caudate, consistent with an acute to early subacute ischemic infarct. MRA revealed severe stenosis of the left A1 and M1 segments, with considerable irregularity of the M1 segment. PMH: DM, HTN.    PT Comments    Pt is demonstrating progress towards her goals through ambulating an increased distance of up to ~250 ft without UE support and navigating a flight of stairs with 1 handrail. However, pt continues to display deficits in cognition, coordination, and balance resulting in her continuing to demonstrate gait deviations that result in bouts of LOB, needing up to minA to recover. Will continue to follow acutely. Current recommendations remain appropriate.    Follow Up Recommendations  Outpatient PT;Supervision/Assistance - 24 hour (neuro specialty ideally; 24/7 initially until improves)     Equipment Recommendations  Other (comment) (shower chair)    Recommendations for Other Services       Precautions / Restrictions Precautions Precautions: Fall Precaution Comments: seizure Restrictions Weight Bearing Restrictions: No    Mobility  Bed Mobility Overal bed mobility: Modified Independent             General bed mobility comments: Pt sitting up in recliner upon arrival.    Transfers Overall transfer level: Needs assistance Equipment used: Rolling walker (2 wheeled) Transfers: Sit to/from Stand Sit to Stand: Min guard         General transfer comment: Min guard for safety, no LOB.  Ambulation/Gait Ambulation/Gait assistance: Min guard;Min assist Gait Distance (Feet): 250 Feet Assistive device: None Gait Pattern/deviations: Step-through pattern;Decreased stride  length;Narrow base of support;Scissoring Gait velocity: reduced Gait velocity interpretation: 1.31 - 2.62 ft/sec, indicative of limited community ambulator (self-selected gait speed this date) General Gait Details: Pt with small bil steps and narrow BOS with a tendency to almost scissor at times, primarily when turning. Cued to widen BOS and avoid scissoring, momentary success. Min guard majority of time for safety, but pt tends to have LOB when cued to pause and when turning needing up to minA to recover. Pt grazes objects and gets proximal to them on her R, needing cues to attend to.   Stairs Stairs: Yes Stairs assistance: Min guard Stair Management: One rail Left;One rail Right;Alternating pattern;Forwards Number of Stairs: 10 General stair comments: Ascends with L rail and descends with R, displaying reciprocal gait pattern. Decreased speed but no LOB, min guard for safety.   Wheelchair Mobility    Modified Rankin (Stroke Patients Only) Modified Rankin (Stroke Patients Only) Pre-Morbid Rankin Score: No symptoms Modified Rankin: Moderately severe disability     Balance Overall balance assessment: Needs assistance Sitting-balance support: No upper extremity supported;Feet supported Sitting balance-Leahy Scale: Good     Standing balance support: No upper extremity supported;During functional activity Standing balance-Leahy Scale: Good Standing balance comment: Min guard-minA for mobility without UE support.                            Cognition Arousal/Alertness: Awake/alert Behavior During Therapy: WFL for tasks assessed/performed Overall Cognitive Status: Impaired/Different from baseline Area of Impairment: Attention;Memory;Following commands;Safety/judgement;Awareness;Problem solving                   Current Attention Level: Focused  Memory: Decreased short-term memory Following Commands: Follows multi-step commands with increased time;Follows  multi-step commands inconsistently Safety/Judgement: Decreased awareness of safety;Decreased awareness of deficits Awareness: Emergent Problem Solving: Slow processing;Requires verbal cues General Comments: Pt forgetful, reverting back to prior gait pattern even when repeatedly cued. Pt with slow processing and unawareness into her deficits and safety, bumping into objects or getting very proximal to obstacles on her R intermittently.      Exercises      General Comments General comments (skin integrity, edema, etc.): HR in 120s with activity      Pertinent Vitals/Pain Pain Assessment: Faces Faces Pain Scale: No hurt Pain Intervention(s): Monitored during session    Home Living Family/patient expects to be discharged to:: Private residence Living Arrangements: Children (adult daughter (19yo)) Available Help at Discharge: Family;Available 24 hours/day (Oldest daughter (33yo) staying at dc) Type of Home: House Home Access: Stairs to enter Entrance Stairs-Rails: Can reach both (on front stairs, no rails in back entrance) Home Layout: One level Home Equipment: None      Prior Function Level of Independence: Independent      Comments: 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   PT Goals (current goals can now be found in the care plan section) Acute Rehab PT Goals Patient Stated Goal: to go home PT Goal Formulation: With patient/family Time For Goal Achievement: 10/04/20 Potential to Achieve Goals: Good Progress towards PT goals: Progressing toward goals    Frequency    Min 4X/week      PT Plan Current plan remains appropriate    Co-evaluation              AM-PAC PT "6 Clicks" Mobility   Outcome Measure  Help needed turning from your back to your side while in a flat bed without using bedrails?: None Help needed moving from lying on your back to sitting on the side of a flat bed without using bedrails?: None Help needed moving to and  from a bed to a chair (including a wheelchair)?: A Little Help needed standing up from a chair using your arms (e.g., wheelchair or bedside chair)?: A Little Help needed to walk in hospital room?: A Little Help needed climbing 3-5 steps with a railing? : A Little 6 Click Score: 20    End of Session Equipment Utilized During Treatment: Gait belt Activity Tolerance: Patient tolerated treatment well Patient left: with call bell/phone within reach;with family/visitor present;in chair;with chair alarm set   PT Visit Diagnosis: Unsteadiness on feet (R26.81);Other abnormalities of gait and mobility (R26.89);Muscle weakness (generalized) (M62.81);Difficulty in walking, not elsewhere classified (R26.2);Other symptoms and signs involving the nervous system (R29.898)     Time: 7062-3762 PT Time Calculation (min) (ACUTE ONLY): 15 min  Charges:  $Gait Training: 8-22 mins                     Raymond Gurney, PT, DPT Acute Rehabilitation Services  Pager: [phone] Office: [phone]    Jewel Baize 09/21/2020, 5:35 PM

## 2020-09-21 NOTE — Evaluation (Signed)
Occupational Therapy Evaluation Patient Details Name: Tricia Bridges MRN: [id number] DOB: [birth year] Today's Date: 09/21/2020    History of Present Illness 55 y.o. female who presented 6/6 with seizure activity. ETT 6/6-6/8. MRI of head revealed a 1.2 cm focus of diffusion abnormality involving the left caudate, consistent with an acute to early subacute ischemic infarct. MRA revealed severe stenosis of the left A1 and M1 segments, with considerable irregularity of the M1 segment. PMH: DM, HTN.   Clinical Impression   PTA, pt was living with her daughter and was independent; working and driving. Pt currently performing LB ADLs and functional mobility with Min Guard-Min A due to decreased balance, coordination, and safety. Pt presenting with decreased cognition with decreased attention, processing, awareness, and executive functioning. Pt would benefit from further acute OT to facilitate safe dc. Recommend dc to home with follow up at neuro OP for further OT to optimize safety, independence with ADLs, and return to PLOF.     Follow Up Recommendations  Outpatient OT    Equipment Recommendations  3 in 1 bedside commode (as shower seat)    Recommendations for Other Services PT consult;Speech consult     Precautions / Restrictions Precautions Precautions: Fall      Mobility Bed Mobility Overal bed mobility: Modified Independent             General bed mobility comments: Pt able to perform all bed mobility safely with mod I.    Transfers Overall transfer level: Needs assistance Equipment used: None Transfers: Sit to/from Stand Sit to Stand: Min guard         General transfer comment: min guard a for safety    Balance Overall balance assessment: Needs assistance Sitting-balance support: No upper extremity supported;Feet supported Sitting balance-Leahy Scale: Good     Standing balance support: No upper extremity supported;During functional activity Standing  balance-Leahy Scale: Good Standing balance comment: Able to ambulate and able to reach off BOS to ground and across midline moderately with min guard-minA.                           ADL either performed or assessed with clinical judgement   ADL Overall ADL's : Needs assistance/impaired Eating/Feeding: Set up;Sitting   Grooming: Min guard;Standing   Upper Body Bathing: Supervision/ safety;Set up;Sitting   Lower Body Bathing: Minimal assistance;Sit to/from stand   Upper Body Dressing : Supervision/safety;Set up;Sitting   Lower Body Dressing: Minimal assistance;Sit to/from stand   Toilet Transfer: Min guard;Ambulation Toilet Transfer Details (indicate cue type and reason): simulated to recliner         Functional mobility during ADLs: Min guard;Minimal assistance General ADL Comments: Pt presenting with decreased cognition, balance, and coordination     Vision Baseline Vision/History: No visual deficits       Perception     Praxis      Pertinent Vitals/Pain Pain Assessment: No/denies pain     Hand Dominance Right   Extremity/Trunk Assessment Upper Extremity Assessment Upper Extremity Assessment: RUE deficits/detail;LUE deficits/detail RUE Deficits / Details: Noting decreasd coorindation during finger to nose, opposition, and rapid alteranting movements. WFL strength RUE Coordination: decreased fine motor;decreased gross motor LUE Deficits / Details: Noting decreasd coorindation during finger to nose, opposition, and rapid alteranting movements. WFL strength LUE Coordination: decreased fine motor;decreased gross motor   Lower Extremity Assessment Lower Extremity Assessment: Defer to PT evaluation RLE Deficits / Details: Slight weakness with MMT score of 4 in R hip flexors  and anterior tibialis muscles compared to L scoring 4+ to 5 RLE Sensation: WNL RLE Coordination: decreased fine motor;decreased gross motor (possible dysdiadochokinesia noted) LLE  Deficits / Details: MMT scores of 4+ to 5 grossly LLE Sensation: WNL LLE Coordination: decreased gross motor   Cervical / Trunk Assessment Cervical / Trunk Assessment: Normal   Communication Communication Communication: No difficulties   Cognition Arousal/Alertness: Awake/alert Behavior During Therapy: WFL for tasks assessed/performed Overall Cognitive Status: Impaired/Different from baseline Area of Impairment: Attention;Memory;Following commands;Safety/judgement;Awareness;Problem solving                   Current Attention Level: Sustained Memory: Decreased short-term memory Following Commands: Follows multi-step commands with increased time;Follows multi-step commands inconsistently Safety/Judgement: Decreased awareness of safety;Decreased awareness of deficits Awareness: Emergent Problem Solving: Slow processing;Requires verbal cues General Comments: Pt reporting she has noticed her processing is slower and her memory is off. Pt using vending machine to visual scan and find requested item; pt requiring signficiant time to locate items and then state which code relates to it. Pt then typing number. Pt recalling 2/3 memory words; requiring categorical cues for recalling last word. Difficulty with answering therapist's questions and maintaining walking speed; stopping or slowing to answer questions   General Comments  HR elevating to 120s with activity    Exercises     Shoulder Instructions      Home Living Family/patient expects to be discharged to:: Private residence Living Arrangements: Children (adult daughter (19yo)) Available Help at Discharge: Family;Available 24 hours/day (Oldest daughter (33yo) staying at dc) Type of Home: House Home Access: Stairs to enter [employer] of Steps: 2-5 (5 stairs in front and 2 in back) Entrance Stairs-Rails: Can reach both (on front stairs, no rails in back entrance) Home Layout: One level     Bathroom Shower/Tub:  Tub/shower unit;Curtain   Firefighter: Standard     Home Equipment: None      Lives With: Daughter    Prior Functioning/Environment Level of Independence: Independent        Comments: 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        OT Problem List: Impaired balance (sitting and/or standing);Decreased coordination;Decreased cognition;Decreased safety awareness;Decreased knowledge of precautions      OT Treatment/Interventions: Self-care/ADL training;Therapeutic exercise;Energy conservation;DME and/or AE instruction;Therapeutic activities;Cognitive remediation/compensation;Patient/family education;Balance training    OT Goals(Current goals can be found in the care plan section) Acute Rehab OT Goals Patient Stated Goal: to get better and return to work OT Goal Formulation: With patient Time For Goal Achievement: 10/05/20 Potential to Achieve Goals: Good  OT Frequency: Min 2X/week   Barriers to D/C:            Co-evaluation              AM-PAC OT "6 Clicks" Daily Activity     Outcome Measure Help from another person eating meals?: A Little Help from another person taking care of personal grooming?: A Little Help from another person toileting, which includes using toliet, bedpan, or urinal?: A Little Help from another person bathing (including washing, rinsing, drying)?: A Little Help from another person to put on and taking off regular upper body clothing?: A Little Help from another person to put on and taking off regular lower body clothing?: A Little 6 Click Score: 18   End of Session Equipment Utilized During Treatment: Gait belt Nurse Communication: Mobility status  Activity Tolerance: Patient tolerated treatment well Patient left: in chair;with call bell/phone  within reach;with chair alarm set  OT Visit Diagnosis: Unsteadiness on feet (R26.81);Other abnormalities of gait and mobility (R26.89);Muscle weakness (generalized)  (M62.81);Other symptoms and signs involving cognitive function                Time: 8676-1950 OT Time Calculation (min): 24 min Charges:  OT General Charges $OT Visit: 1 Visit OT Evaluation $OT Eval Moderate Complexity: 1 Mod OT Treatments $Therapeutic Activity: 8-22 mins  Lawsen Arnott MSOT, OTR/L Acute Rehab Pager: [phone] Office: [phone]  Theodoro Grist Jaryd Drew 09/21/2020, 5:24 PM

## 2020-09-21 NOTE — Progress Notes (Signed)
PROGRESS NOTE    Tricia Bridges   ZOX:[id number]  DOB: 05/15/1965  DOA: 09/16/2020 PCP: Pcp, No   Brief Narrative:  Tricia Bridges is a 55 year old female who presented to [facility] in status epilepticus.  No prior history of seizures.  EMS witnessed 2 episodes of seizures in addition to at least 1 seizure at home.  The patient was given Versed.  In the ED the patient was noted to seize twice.  She was intubated and admitted to the ICU.  Propofol used for seizure suppression. In the ED CBG was in the 600s, WBC count was 20.  She had acute respiratory failure with hypercarbia (pH 6.9 and PCO2 103), CBG of 623, lactic acidosis with a level of 5.7, fever of 100. Started on an insulin infusion.   Subjective: She has no new complaints.   Assessment & Plan:   Principal Problem:   Status epilepticus /acute/subacute CVA -Appreciate neurology management -Status post LTM -Now on 500 mg of Keppra twice daily per neurology- -MRI performed on 6/8> 1.2 cm focus of diffusion abnormality involving the left caudate, consistent with an acute to early subacute ischemic infarct - MRA head and neck> Intracranial MR angiography shows severe stenosis of the left A1 and M1 segments -I suspect seizures may be secondary to the CVA -2D echo not reveal a thrombus-she has grade 1 diastolic dysfunction-full report below -She has been restarted on aspirin 81 mg and Plavix 75 mg daily.  Plavix should be discontinued in 3 months. -Follow-up with neurology recommended in 3 months - discussed plan with neurology  Active Problems: Essential hypertension -Continue amlodipine - enalapril and HCTZ being held >> will resume Enalapril  Type 2 diabetes mellitus , uncontrolled with hyperglycemia with long-term current use of insulin  -HBA1c > 15.5when she was admitted -She takes Lantus and Glucophage as outpatient -Continue Lantus and sliding scale insulin at this time and follow glucose levels    Acute  respiratory failure with hypercapnia-vent dependent respiratory failure ?  Aspiration pneumonia secondary to seizures fever on admission, leukocytosis and right sided infiltrate/atelectasis on xray --5 day course ceftriaxone will be completed today- follow off antibiotics    Acute metabolic encephalopathy - alert and has even eaten but is very slow to respond- may be postictal- has resolved  Hypokalemia - Aggressively replaced    Non-traumatic rhabdomyolysis -CK 6709 > 15, 801> 10, 234 > 9,695  -continue IV fluids to prevent AKI  Time spent in minutes: 35 DVT prophylaxis: heparin injection 5,000 Units Start: 09/16/20 2200 SCDs Start: 09/16/20 1929 Code Status: Full code Family Communication: daughters Level of Care: Level of care: Med-Surg Disposition Plan:  Status is: Inpatient  Remains inpatient appropriate because:IV treatments appropriate due to intensity of illness or inability to take PO  Dispo: The patient is from: Home              Anticipated d/c is to: Home              Patient currently is not medically stable to d/c.   Difficult to place patient No      Consultants:  Neurology PCCM Procedures:  Intubation Antimicrobials:  Anti-infectives (From admission, onward)    Start     Dose/Rate Route Frequency Ordered Stop   09/18/20 0800  cefTRIAXone (ROCEPHIN) 2 g in sodium chloride 0.9 % 100 mL IVPB        2 g 200 mL/hr over 30 Minutes Intravenous Every 24 hours 09/17/20 0950 09/21/20 0835  09/16/20 2200  cefTRIAXone (ROCEPHIN) 2 g in sodium chloride 0.9 % 100 mL IVPB  Status:  Discontinued        2 g 200 mL/hr over 30 Minutes Intravenous Every 12 hours 09/16/20 2057 09/17/20 0950        Objective: Vitals:   09/20/20 2327 09/21/20 0320 09/21/20 0700 09/21/20 1100  BP: 125/83 (!) 122/97 (!) 144/96 (!) 144/99  Pulse: (!) 105 (!) 101  100  Resp: 20 20  (!) 24  Temp:  98.3 F (36.8 C) 98.4 F (36.9 C) 98.1 F (36.7 C)  TempSrc:  Oral Oral Oral  SpO2:  97% 96%  96%  Weight:      Height:        Intake/Output Summary (Last 24 hours) at 09/21/2020 1245 Last data filed at 09/21/2020 0500 Gross per 24 hour  Intake 2645.45 ml  Output --  Net 2645.45 ml    Filed Weights   09/16/20 1920 09/18/20 0500 09/19/20 0451  Weight: 72.7 kg 66.9 kg 61.8 kg    Examination: General exam: Appears comfortable  HEENT: PERRLA, oral mucosa moist, no sclera icterus or thrush Respiratory system: Clear to auscultation. Respiratory effort normal. Cardiovascular system: S1 & S2 heard, regular rate and rhythm Gastrointestinal system: Abdomen soft, non-tender, nondistended. Normal bowel sounds   Central nervous system: Alert and oriented. No focal neurological deficits. Extremities: No cyanosis, clubbing or edema Skin: No rashes or ulcers Psychiatry:  Mood & affect appropriate.      Data Reviewed: I have personally reviewed following labs and imaging studies  CBC: Recent Labs  Lab 09/16/20 1748 09/16/20 1809 09/17/20 0336 09/17/20 0416 09/17/20 1719 09/18/20 0546 09/18/20 1635 09/19/20 0322 09/20/20 0500  WBC 20.0*  --  18.9*  --   --  14.8*  --  12.7* 7.6  NEUTROABS 18.2*  --   --   --   --   --   --   --   --   HGB 15.5*   < > 16.0*   < > 13.3 13.5 14.3 14.6 14.3  HCT 49.1*   < > 47.1*   < > 39.0 40.5 42.0 44.4 43.1  MCV 90.9  --  86.1  --   --  87.9  --  88.4 88.3  PLT 246  --  204  --   --  165  --  172 171   < > = values in this interval not displayed.    Basic Metabolic Panel: Recent Labs  Lab 09/17/20 0336 09/17/20 0416 09/18/20 0546 09/18/20 0935 09/18/20 1635 09/19/20 0322 09/20/20 0500 09/21/20 0339  NA 145   < > 146* 143 141 138 138 138  K 3.9   < > 4.1 3.3* 3.2* 3.4* 5.1 3.9  CL 109   < > 127* 109  --  99 106 104  CO2 22   < > 15* 23  --  GLUCOSE 195*   < > 77 143*  --  135* 82 129*  BUN 13   < > 5* 6  --  5* 7 8  CREATININE 0.91   < > 0.44 0.68  --  0.71 0.56 0.71  CALCIUM 9.5   < > 5.4* 8.8*  --  9.0  8.2* 8.9  MG 2.3  --  1.9 1.8  --  1.8 1.7  --   PHOS 2.3*  --  3.2 3.8  --  4.5 3.1  --    < > =  values in this interval not displayed.    GFR: Estimated Creatinine Clearance: 65 mL/min (by C-G formula based on SCr of 0.71 mg/dL). Liver Function Tests: Recent Labs  Lab 09/16/20 1748  AST 32  ALT 29  ALKPHOS 135*  BILITOT 0.5  PROT 7.4  ALBUMIN 3.7    No results for input(s): LIPASE, AMYLASE in the last 168 hours. Recent Labs  Lab 09/16/20 2336  AMMONIA 27    Coagulation Profile: No results for input(s): INR, PROTIME in the last 168 hours. Cardiac Enzymes: Recent Labs  Lab 09/17/20 0336 09/18/20 0546 09/19/20 1505 09/20/20 0500 09/21/20 0339  CKTOTAL 2,182* 6,709* 15,801* 10,234* 9,695*    BNP (last 3 results) No results for input(s): PROBNP in the last 8760 hours. HbA1C: No results for input(s): HGBA1C in the last 72 hours.  CBG: Recent Labs  Lab 09/20/20 1129 09/20/20 1721 09/20/20 2118 09/21/20 0805 09/21/20 1138  GLUCAP 269* 95 199* 84 257*    Lipid Profile: Recent Labs    09/19/20 1259  CHOL 245*  HDL 47  LDLCALC 179*  TRIG 97  CHOLHDL 5.2    Thyroid Function Tests: No results for input(s): TSH, T4TOTAL, FREET4, T3FREE, THYROIDAB in the last 72 hours. Anemia Panel: No results for input(s): VITAMINB12, FOLATE, FERRITIN, TIBC, IRON, RETICCTPCT in the last 72 hours. Urine analysis:    Component Value Date/Time   COLORURINE STRAW (A) 06/15/2016 1711   APPEARANCEUR CLEAR 06/15/2016 1711   LABSPEC 1.026 06/15/2016 1711   PHURINE 5.0 06/15/2016 1711   GLUCOSEU >=500 (A) 06/15/2016 1711   HGBUR NEGATIVE 06/15/2016 1711   BILIRUBINUR NEGATIVE 06/15/2016 1711   KETONESUR 5 (A) 06/15/2016 1711   PROTEINUR NEGATIVE 06/15/2016 1711   NITRITE NEGATIVE 06/15/2016 1711   LEUKOCYTESUR NEGATIVE 06/15/2016 1711   Sepsis Labs: @LABRCNTIP (procalcitonin:4,lacticidven:4) ) Recent Results (from the past 240 hour(s))  Culture, blood (routine x 2)      Status: None   Collection Time: 09/16/20  5:48 PM   Specimen: BLOOD  Result Value Ref Range Status   Specimen Description BLOOD BLOOD RIGHT FOREARM  Final   Special Requests   Final    BOTTLES DRAWN AEROBIC AND ANAEROBIC Blood Culture results may not be optimal due to an inadequate volume of blood received in culture bottles   Culture   Final    NO GROWTH 5 DAYS Performed at [facility], [address], [place], [place]    Report Status 09/21/2020 FINAL  Final  SARS CORONAVIRUS 2 (TAT 6-24 HRS) Nasopharyngeal Nasopharyngeal Swab     Status: None   Collection Time: 09/16/20  6:39 PM   Specimen: Nasopharyngeal Swab  Result Value Ref Range Status   SARS Coronavirus 2 NEGATIVE NEGATIVE Final    Comment: (NOTE) SARS-CoV-2 target nucleic acids are NOT DETECTED.  The SARS-CoV-2 RNA is generally detectable in upper and lower respiratory specimens during the acute phase of infection. Negative results do not preclude SARS-CoV-2 infection, do not rule out co-infections with other pathogens, and should not be used as the sole basis for treatment or other patient management decisions. Negative results must be combined with clinical observations, patient history, and epidemiological information. The expected result is Negative.  Fact Sheet for Patients: 11/16/20  Fact Sheet for Healthcare Providers: HairSlick.no  This test is not yet approved or cleared by the quierodirigir.com FDA and  has been authorized for detection and/or diagnosis of SARS-CoV-2 by FDA under an Emergency Use Authorization (EUA). This EUA will remain  in effect (meaning this test can be used) for the duration of the COVID-19 declaration under Se ction 564(b)(1) of the Act, 21 U.S.C. section 360bbb-3(b)(1), unless the authorization is terminated or revoked sooner.  Performed at [facility], [address], [place],  [place] [address]   Culture, blood (routine x 2)     Status: None   Collection Time: 09/16/20  9:20 PM   Specimen: BLOOD  Result Value Ref Range Status   Specimen Description BLOOD SITE NOT SPECIFIED  Final   Special Requests   Final    BOTTLES DRAWN AEROBIC AND ANAEROBIC Blood Culture results may not be optimal due to an inadequate volume of blood received in culture bottles   Culture   Final    NO GROWTH 5 DAYS Performed at [facility], [address], [place], [place] [address]    Report Status 09/21/2020 FINAL  Final  MRSA PCR Screening     Status: None   Collection Time: 09/16/20 10:28 PM   Specimen: Nasal Mucosa; Nasopharyngeal  Result Value Ref Range Status   MRSA by PCR NEGATIVE NEGATIVE Final    Comment:        The GeneXpert MRSA Assay (FDA approved for NASAL specimens only), is one component of a comprehensive MRSA colonization surveillance program. It is not intended to diagnose MRSA infection nor to guide or monitor treatment for MRSA infections. Performed at [facility], [address] [place], [place] [address]          Radiology Studies: MR ANGIO HEAD WO CONTRAST  Result Date: 09/19/2020 CLINICAL DATA:  Follow-up acute infarction left caudate and left parietal cortex EXAM: MRA HEAD WITHOUT CONTRAST MRA NECK WITHOUT CONTRAST TECHNIQUE: Angiographic images of the Circle of Willis were obtained using MRA technique without intravenous contrast. Angiographic images of the neck were obtained using MRA technique without intravenous contrast. Carotid stenosis measurements (when applicable) are obtained utilizing NASCET criteria, using the distal internal carotid diameter as the denominator. COMPARISON:  MRI yesterday. FINDINGS: MRA HEAD FINDINGS Both internal carotid arteries widely patent through the skull base and siphon regions. On the right, the anterior and middle cerebral arteries are patent and normal. On the left, there is moderate to severe stenosis at  both the A1 and M1 segments, with considerable irregularity particularly of the M1 segment. The patient would be at risk of additional infarction in these vascular territories. Both vertebral arteries widely patent to the basilar. No basilar stenosis. Posterior circulation branch vessels are normal. Left PCA takes fetal origin from the anterior circulation. Large posterior communicating artery on the right. MRA NECK FINDINGS Branching pattern from the arch is normal. Both common carotid arteries are widely patent to the bifurcation. Both carotid bifurcations are widely patent. There may be minimal nonstenotic plaque at the ICA bulb on the left. Both vertebral arteries widely patent from their origins to the basilar. IMPRESSION: No advanced finding in the neck. Minimal atherosclerotic plaque at the ICA bulb on the left but without stenosis. Intracranial MR angiography shows severe stenosis of the left A1 and M1 segments, with considerable irregularity of the M1 segment. Patient would be at risk of additional infarction in these vascular territories. Electronically Signed   By: Paulina Fusi M.D.   On: 09/19/2020 15:55   MR ANGIO NECK WO CONTRAST  Result Date: 09/19/2020 CLINICAL DATA:  Follow-up acute infarction left caudate and left parietal cortex EXAM: MRA HEAD WITHOUT CONTRAST MRA NECK WITHOUT CONTRAST TECHNIQUE: Angiographic  images of the Circle of Willis were obtained using MRA technique without intravenous contrast. Angiographic images of the neck were obtained using MRA technique without intravenous contrast. Carotid stenosis measurements (when applicable) are obtained utilizing NASCET criteria, using the distal internal carotid diameter as the denominator. COMPARISON:  MRI yesterday. FINDINGS: MRA HEAD FINDINGS Both internal carotid arteries widely patent through the skull base and siphon regions. On the right, the anterior and middle cerebral arteries are patent and normal. On the left, there is moderate  to severe stenosis at both the A1 and M1 segments, with considerable irregularity particularly of the M1 segment. The patient would be at risk of additional infarction in these vascular territories. Both vertebral arteries widely patent to the basilar. No basilar stenosis. Posterior circulation branch vessels are normal. Left PCA takes fetal origin from the anterior circulation. Large posterior communicating artery on the right. MRA NECK FINDINGS Branching pattern from the arch is normal. Both common carotid arteries are widely patent to the bifurcation. Both carotid bifurcations are widely patent. There may be minimal nonstenotic plaque at the ICA bulb on the left. Both vertebral arteries widely patent from their origins to the basilar. IMPRESSION: No advanced finding in the neck. Minimal atherosclerotic plaque at the ICA bulb on the left but without stenosis. Intracranial MR angiography shows severe stenosis of the left A1 and M1 segments, with considerable irregularity of the M1 segment. Patient would be at risk of additional infarction in these vascular territories. Electronically Signed   By: Paulina Fusi M.D.   On: 09/19/2020 15:55   ECHOCARDIOGRAM COMPLETE  Result Date: 09/20/2020    ECHOCARDIOGRAM REPORT   Patient Name:   Tricia Bridges Date of Exam: 09/20/2020 Medical Rec #:  [id number]      Height:       63.0 in Accession #:    [id number]     Weight:       136.2 lb Date of Birth:  06-28-1965       BSA:          1.643 m Patient Age:    54 years       BP:           144/92 mmHg Patient Gender: F              HR:           99 bpm. Exam Location:  Inpatient Procedure: 2D Echo, Cardiac Doppler and Color Doppler Indications:    CVA  History:        Patient has no prior history of Echocardiogram examinations.                 Signs/Symptoms:Fever. Epilepsy.  Sonographer:    Lavenia Atlas Referring Phys: [id number] PRIYANKA O YADAV IMPRESSIONS  1. Left ventricular ejection fraction, by estimation, is 60 to 65%.  The left ventricle has normal function. The left ventricle has no regional wall motion abnormalities. There is mild concentric left ventricular hypertrophy of the septal and basal-septal segments. Left ventricular diastolic parameters are consistent with Grade I diastolic dysfunction (impaired relaxation).  2. Right ventricular systolic function is normal. The right ventricular size is normal. Tricuspid regurgitation signal is inadequate for assessing PA pressure.  3. The mitral valve is normal in structure. No evidence of mitral valve regurgitation. No evidence of mitral stenosis.  4. The aortic valve is tricuspid. Aortic valve regurgitation is not visualized. Mild to moderate aortic valve sclerosis/calcification is present, without any evidence of aortic stenosis.  5.  The inferior vena cava is normal in size with greater than 50% respiratory variability, suggesting right atrial pressure of 3 mmHg. FINDINGS  Left Ventricle: Left ventricular ejection fraction, by estimation, is 60 to 65%. The left ventricle has normal function. The left ventricle has no regional wall motion abnormalities. The left ventricular internal cavity size was normal in size. There is  mild concentric left ventricular hypertrophy of the septal and basal-septal segments. Left ventricular diastolic parameters are consistent with Grade I diastolic dysfunction (impaired relaxation). Normal left ventricular filling pressure. Right Ventricle: The right ventricular size is normal. No increase in right ventricular wall thickness. Right ventricular systolic function is normal. Tricuspid regurgitation signal is inadequate for assessing PA pressure. Left Atrium: Left atrial size was normal in size. Right Atrium: Right atrial size was normal in size. Pericardium: There is no evidence of pericardial effusion. Mitral Valve: The mitral valve is normal in structure. No evidence of mitral valve regurgitation. No evidence of mitral valve stenosis. Tricuspid  Valve: The tricuspid valve is normal in structure. Tricuspid valve regurgitation is not demonstrated. No evidence of tricuspid stenosis. Aortic Valve: The aortic valve is tricuspid. Aortic valve regurgitation is not visualized. Mild to moderate aortic valve sclerosis/calcification is present, without any evidence of aortic stenosis. Pulmonic Valve: The pulmonic valve was normal in structure. Pulmonic valve regurgitation is not visualized. No evidence of pulmonic stenosis. Aorta: The aortic root is normal in size and structure. Venous: The inferior vena cava is normal in size with greater than 50% respiratory variability, suggesting right atrial pressure of 3 mmHg. IAS/Shunts: No atrial level shunt detected by color flow Doppler.  LEFT VENTRICLE PLAX 2D LVIDd:         4.10 cm      Diastology LVIDs:         2.80 cm      LV e' medial:    5.22 cm/s LV PW:         1.20 cm      LV E/e' medial:  12.9 LV IVS:        1.30 cm      LV e' lateral:   6.31 cm/s LVOT diam:     2.10 cm      LV E/e' lateral: 10.7 LV SV:         61 LV SV Index:   37 LVOT Area:     3.46 cm  LV Volumes (MOD) LV vol d, MOD A4C: 104.0 ml LV vol s, MOD A4C: 27.9 ml LV SV MOD A4C:     104.0 ml RIGHT VENTRICLE RV Basal diam:  2.80 cm RV S prime:     10.40 cm/s TAPSE (M-mode): 1.6 cm LEFT ATRIUM             Index       RIGHT ATRIUM          Index LA diam:        3.20 cm 1.95 cm/m  RA Area:     8.29 cm LA Vol (A2C):   29.5 ml 17.96 ml/m RA Volume:   14.70 ml 8.95 ml/m LA Vol (A4C):   39.8 ml 24.23 ml/m LA Biplane Vol: 34.5 ml 21.00 ml/m  AORTIC VALVE LVOT Vmax:   99.00 cm/s LVOT Vmean:  68.000 cm/s LVOT VTI:    0.175 m  AORTA Ao Root diam: 2.50 cm MITRAL VALVE MV Area (PHT): 3.85 cm     SHUNTS MV Decel Time: 197 msec     Systemic VTI:  0.18 m MV E velocity: 67.40 cm/s   Systemic Diam: 2.10 cm MV A velocity: 111.00 cm/s MV E/A ratio:  0.61 Armanda Magicraci Turner MD Electronically signed by Armanda Magicraci Turner MD Signature Date/Time: 09/20/2020/1:51:44 PM    Final        Scheduled Meds:  amLODipine  5 mg Oral Daily   aspirin  81 mg Oral Daily   Chlorhexidine Gluconate Cloth  6 each Topical Daily   clopidogrel  75 mg Oral Daily   enalapril  10 mg Oral Daily   fentaNYL (SUBLIMAZE) injection  50 mcg Intravenous Once   heparin  5,000 Units Subcutaneous Q8H   insulin aspart  0-15 Units Subcutaneous TID WC   insulin glargine  14 Units Subcutaneous BID   levETIRAcetam  500 mg Oral BID   mouth rinse  15 mL Mouth Rinse BID   polyethylene glycol  17 g Oral Daily   sodium chloride flush  10-40 mL Intracatheter Q12H   [START ON 09/25/2020] thiamine injection  100 mg Intravenous Daily   Continuous Infusions:  sodium chloride 75 mL/hr at 09/21/20 0555   thiamine injection 250 mg (09/21/20 1111)     LOS: 5 days      Calvert CantorSaima Lennox Leikam, MD [facility] Pager: [email or web address] 09/21/2020, 12:45 PM

## 2020-09-21 NOTE — Evaluation (Signed)
Speech Language Pathology Evaluation Patient Details Name: Evan Osburn MRN: [id number] DOB: 1965/12/22 Today's Date: 09/21/2020 Time: 2836-6294 SLP Time Calculation (min) (ACUTE ONLY): 25 min  Problem List:  Patient Active Problem List   Diagnosis Date Noted   Non-traumatic rhabdomyolysis    Status epilepticus (HCC) 09/16/2020   Acute respiratory failure with hypercapnia (HCC)    HHNC (hyperglycemic hyperosmolar nonketotic coma) (HCC)    Acute metabolic encephalopathy    Mixed hyperlipidemia 11/25/2017   Well adult exam 09/24/2017   Type 2 diabetes mellitus without complication, with long-term current use of insulin (HCC) 08/13/2017   Essential hypertension 08/13/2017   Breast cancer screening 08/13/2017   Bilateral carpal tunnel syndrome 07/29/2017   Past Medical History:  Past Medical History:  Diagnosis Date   Diabetes mellitus without complication (HCC)    Hypertension    Pre-diabetes    Past Surgical History:  Past Surgical History:  Procedure Laterality Date   CESAREAN SECTION     HPI:  Pt is a 55 y.o. female who presented 6/6 with seizure activity. ETT 6/6-6/8. MRI of head revealed a 1.2 cm focus of diffusion abnormality involving the left caudate, consistent with an acute to early subacute ischemic infarct. MRA revealed severe stenosis of the left A1 and M1 segments, with considerable irregularity of the M1 segment. PMH: DM, HTN.   Assessment / Plan / Recommendation Clinical Impression  Ms. 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 She initially denied cognitive changes, but after testing reported, "that was harder than I thought--I guess I am having trouble." Deficits were found in areas of memory, problem solving, and thought organization. See scores below.  [facility] Mental Status Examination: Orientation: 3/3 Coding 5 words: 4/5 (no points given for this  task) Calculations: 1/3 Divergent Naming: 2/3 given cues Recall of 5 words: 0/5 Mental Manipulation/Attention: 0/2 Visual Processing: 6/6 Paragraph Memory: 4/8 Total: 16/30 consistent with moderate-severe cognitive impairment  Recommend: family handle IADLs at discharge and pt begin OP speech therapy for cognition     SLP Assessment  SLP Recommendation/Assessment: Patient needs continued Speech Lanaguage Pathology Services SLP Visit Diagnosis: Cognitive communication deficit (R41.841)    Follow Up Recommendations  Outpatient SLP    Frequency and Duration   To be completed outpatient        SLP Evaluation Cognition  Overall Cognitive Status: Impaired/Different from baseline Arousal/Alertness: Awake/alert Orientation Level: Oriented X4 Attention: Focused Focused Attention: Appears intact Memory: Impaired Memory Impairment: Storage deficit;Retrieval deficit;Decreased short term memory Decreased Short Term Memory: Verbal basic;Functional basic Awareness: Appears intact Problem Solving: Impaired Problem Solving Impairment: Verbal basic;Functional basic Executive Function: Self Monitoring;Decision Making;Self Correcting;Reasoning Reasoning: Impaired Decision Making: Impaired Self Monitoring: Impaired Self Correcting: Impaired Safety/Judgment: Appears intact       Comprehension  Auditory Comprehension Overall Auditory Comprehension: Appears within functional limits for tasks assessed Yes/No Questions: Within Functional Limits Commands: Within Functional Limits Visual Recognition/Discrimination Discrimination: Within Function Limits Reading Comprehension Reading Status: Within funtional limits    Expression Expression Primary Mode of Expression: Verbal Verbal Expression Overall Verbal Expression: Appears within functional limits for tasks assessed Initiation: No impairment Repetition: No impairment Naming: Impairment Divergent: 75-100% accurate Pragmatics: No  impairment   Oral / Motor  Oral Motor/Sensory Function Overall Oral Motor/Sensory Function: Within functional limits Motor Speech Overall Motor Speech: Appears within functional limits for tasks assessed Respiration: Within functional limits Phonation: Normal Resonance: Within functional limits Articulation: Within functional limitis Intelligibility: Intelligible  Aahil Fredin P. Damoney Julia, M.S., CCC-SLP Speech-Language Pathologist Acute Rehabilitation Services Pager: [phone]             Susanne Borders Nellie Chevalier 09/21/2020, 11:36 AM

## 2020-09-22 DIAGNOSIS — G9341 Metabolic encephalopathy: Secondary | ICD-10-CM

## 2020-09-22 LAB — GLUCOSE, CAPILLARY
Glucose-Capillary: 120 mg/dL — ABNORMAL HIGH (ref 70–99)
Glucose-Capillary: 58 mg/dL — ABNORMAL LOW (ref 70–99)
Glucose-Capillary: 225 mg/dL — ABNORMAL HIGH (ref 70–99)

## 2020-09-22 LAB — BASIC METABOLIC PANEL
Anion gap: 8 (ref 5–15)
BUN: 10 mg/dL (ref 6–20)
CO2: 26 mmol/L (ref 22–32)
Calcium: 8.8 mg/dL — ABNORMAL LOW (ref 8.9–10.3)
Chloride: 106 mmol/L (ref 98–111)
Creatinine, Ser: 0.74 mg/dL (ref 0.44–1.00)
GFR, Estimated: >60 mL/min (ref >60)
Glucose, Bld: 105 mg/dL — ABNORMAL HIGH (ref 70–99)
Potassium: 3.5 mmol/L (ref 3.5–5.1)
Sodium: 140 mmol/L (ref 135–145)

## 2020-09-22 LAB — CK: Total CK: 3074 U/L — ABNORMAL HIGH (ref 38–234)

## 2020-09-22 MED ORDER — METFORMIN HCL ER 750 MG PO TB24: 750.0000 mg | ORAL_TABLET | Freq: Every day | ORAL | 0 refills | Status: DC

## 2020-09-22 MED ORDER — ASPIRIN EC 81 MG PO TBEC: 81.0000 mg | DELAYED_RELEASE_TABLET | Freq: Every day | ORAL | 3 refills | Status: DC

## 2020-09-22 MED ORDER — FLUTICASONE PROPIONATE 50 MCG/ACT NA SUSP: 2.0000 | Freq: Every day | NASAL | 6 refills | Status: DC

## 2020-09-22 MED ORDER — LEVETIRACETAM 500 MG PO TABS: 500.0000 mg | ORAL_TABLET | Freq: Two times a day (BID) | ORAL | 0 refills | Status: DC

## 2020-09-22 MED ORDER — INSULIN GLARGINE 100 UNIT/ML ~~LOC~~ SOLN
10.0000 [IU] | Freq: Two times a day (BID) | SUBCUTANEOUS | Status: DC
  Administered 2020-09-22: 10 [IU] via SUBCUTANEOUS
  Filled 2020-09-22 (×2): qty 0.1

## 2020-09-22 MED ORDER — CLOPIDOGREL BISULFATE 75 MG PO TABS: 75.0000 mg | ORAL_TABLET | Freq: Every day | ORAL | 0 refills | Status: DC

## 2020-09-22 MED ORDER — AMLODIPINE BESYLATE 5 MG PO TABS: ORAL_TABLET | ORAL | 1 refills | Status: DC

## 2020-09-22 MED ORDER — ENALAPRIL MALEATE 10 MG PO TABS: ORAL_TABLET | ORAL | 2 refills | Status: DC

## 2020-09-22 MED ORDER — LANTUS SOLOSTAR 100 UNIT/ML ~~LOC~~ SOPN: 20.0000 [IU] | PEN_INJECTOR | Freq: Every day | SUBCUTANEOUS | 0 refills | Status: DC

## 2020-09-22 MED ORDER — ATORVASTATIN CALCIUM 40 MG PO TABS: 40.0000 mg | ORAL_TABLET | Freq: Every day | ORAL | 3 refills | Status: DC

## 2020-09-22 NOTE — Progress Notes (Signed)
Physician Discharge Summary  Tricia Bridges NWG:[id number] DOB: Apr 05, 1966 DOA: 09/16/2020  PCP: Oneita Hurt, No  Admit date: 09/16/2020 Discharge date: 09/22/2020  Admitted From: home  Disposition:  home   Recommendations for Outpatient Follow-up:  Outpt neuro-cognitive testing recommended She has no insurance and a Social work consult has been request to help with meds, Outpt therapies and a PCP  Home Health:  none  Discharge Condition:  stable   CODE STATUS:  Full code   Diet recommendation:  heart healthy, diabetic Consultations: neurology  Procedures/Studies: EEG Intubation/Extubation   Discharge Diagnoses:  Principal Problem:   Status epilepticus (HCC) Active Problems:   Type 2 diabetes mellitus without complication, with long-term current use of insulin (HCC)   Essential hypertension   Acute respiratory failure with hypercapnia (HCC)   HHNC (hyperglycemic hyperosmolar nonketotic coma) (HCC)   Acute metabolic encephalopathy   Non-traumatic rhabdomyolysis     Brief Summary: Tricia Bridges is a 55 year old female who presented to [facility] in status epilepticus.  No prior history of seizures.  EMS witnessed 2 episodes of seizures in addition to at least 1 seizure at home.  The patient was given Versed.  In the ED the patient was noted to seize twice.  She was intubated and admitted to the ICU.  Propofol used for seizure suppression. In the ED CBG was in the 600s, WBC count was 20.  She had acute respiratory failure with hypercarbia (pH 6.9 and PCO2 103), CBG of 623, lactic acidosis with a level of 5.7, fever of 100. Started on an insulin infusion.  She admitted later in the hospital stay that she had stopped all of her medications- unable to tell me when but daughter states is was "not that long ago".   Hospital Course:  Principal Problem:   Status epilepticus /acute/subacute CVA -Appreciate neurology management -Status post LTM showing burst suppression  -Now on 500  mg of Keppra twice daily which she will be discharged on -MRI performed on 6/8> 1.2 cm focus of diffusion abnormality involving the left caudate, consistent with an acute to early subacute ischemic infarct - MRA head and neck> Intracranial MR angiography shows severe stenosis of the left A1 and M1 segments -I suspect seizures may be secondary to the CVA -2D echo does not reveal a thrombus-she has grade 1 diastolic dysfunction-full report below - LDL 179- Goal < 70- statin started - A1c > 15 -She has been started ASA 81 mg and on Plavix 75 mg daily.  Plavix should be discontinued in 3 months. Aspirin is a home med which she had stopped taking and is going to be continued after Plavix stopped(per neuro) -Follow-up with neurology recommended in 3 months - Outpatient PT/OT/SLP recommended and ordered   Active Problems:   Acute metabolic encephalopathy - alert  but has cognitive impairment  - SLUM ([employer] Mental status exam)> 16/30 -  I recommend a full cognitive evaluation as outpt  Essential hypertension -Continue amlodipine and Enalapril- Stop HCTZ   Type 2 diabetes mellitus , uncontrolled with hyperglycemia with long-term current use of insulin -HBA1c > 15.5  -She is supposed to be taking Lantus and Glucophage as outpatient but has not been -Continued on Lantus and sliding scale insulin in the hospital - resumed Lantus and Glucophage for home use     Acute respiratory failure with hypercapnia-vent dependent respiratory failure ?  Aspiration pneumonia secondary to seizures - noted fever on admission, leukocytosis and right sided infiltrate/atelectasis on xray --5 day course ceftriaxone  completed on 6/11 - she has a mild non- productive cough- no fever or tachypnea and no leukocytosis    Hypokalemia -   replaced  Rhabdomyolysis due to seizures  -CK 6709 > 15, 801> 10, 234 > 9,695> 3074  - treated with IVF- renal function remained stable   Discharge Exam: Vitals:    09/22/20 0700 09/22/20 1100  BP: 120/82 123/80  Pulse:    Resp: 14   Temp: 98.1 F (36.7 C) 97.7 F (36.5 C)  SpO2:     Vitals:   09/22/20 0037 09/22/20 0415 09/22/20 0700 09/22/20 1100  BP: 118/83 131/84 120/82 123/80  Pulse: 86 76    Resp: 19 12 14    Temp: 98.5 F (36.9 C) 98.5 F (36.9 C) 98.1 F (36.7 C) 97.7 F (36.5 C)  TempSrc: Oral Oral Oral Oral  SpO2: 98% 96%    Weight:      Height:        General: Pt is alert, awake, not in acute distress Cardiovascular: RRR, S1/S2 +, no rubs, no gallops Respiratory: CTA bilaterally, no wheezing, no rhonchi Abdominal: Soft, NT, ND, bowel sounds + Extremities: no edema, no cyanosis   Discharge Instructions  Discharge Instructions     Diet - low sodium heart healthy   Complete by: As directed    Diet Carb Modified   Complete by: As directed    Discharge instructions   Complete by: As directed    Stop Plavix in 3 wks.   Increase activity slowly   Complete by: As directed       Allergies as of 09/22/2020       Reactions   Penicillins Itching   Has patient had a PCN reaction causing immediate rash, facial/tongue/throat swelling, SOB or lightheadedness with hypotension: no Has patient had a PCN reaction causing severe rash involving mucus membranes or skin necrosis: no Has patient had a PCN reaction that required hospitalization: no Has patient had a PCN reaction occurring within the last 10 years: yes If all of the above answers are "NO", then may proceed with Cephalosporin use.        Medication List     STOP taking these medications    hydrochlorothiazide 12.5 MG tablet Commonly known as: HYDRODIURIL       TAKE these medications    amLODipine 5 MG tablet Commonly known as: NORVASC TAKE 1 TABLET(5 MG) BY MOUTH DAILY   aspirin EC 81 MG tablet Take 1 tablet (81 mg total) by mouth daily.   atorvastatin 40 MG tablet Commonly known as: LIPITOR Take 1 tablet (40 mg total) by mouth daily.    clopidogrel 75 MG tablet Commonly known as: PLAVIX Take 1 tablet (75 mg total) by mouth daily. Start taking on: September 23, 2020   enalapril 10 MG tablet Commonly known as: VASOTEC TAKE 1 TABLET(10 MG) BY MOUTH DAILY   fluticasone 50 MCG/ACT nasal spray Commonly known as: FLONASE Place 2 sprays into both nostrils daily.   glucose blood test strip Test blood glucose twice daily. Contour next strips. DX E11.56   Lantus SoloStar 100 UNIT/ML Solostar Pen Generic drug: insulin glargine Inject 20 Units into the skin at bedtime. Start tomorrow night Start taking on: September 23, 2020 What changed:  when to take this additional instructions   levETIRAcetam 500 MG tablet Commonly known as: KEPPRA Take 1 tablet (500 mg total) by mouth 2 (two) times daily.   metFORMIN 750 MG 24 hr tablet Commonly known as: GLUCOPHAGE-XR Take 1  tablet (750 mg total) by mouth daily with breakfast. What changed: See the new instructions.   Pen Needles 30G X 8 MM Misc 1 Units by Does not apply route daily. Use to inject inulin daily   VITAMIN D PO Take 1 tablet by mouth daily.   Vitamin E 400 units Tabs Take 1 tablet (400 Units total) by mouth daily.        Follow-up Information     Micki Riley, MD. Schedule an appointment as soon as possible for a visit in 3 week(s).   Specialties: Neurology, Radiology Why: Please make appointment Contact information: [address] [place] [address] [phone]                Allergies  Allergen Reactions   Penicillins Itching    Has patient had a PCN reaction causing immediate rash, facial/tongue/throat swelling, SOB or lightheadedness with hypotension: no Has patient had a PCN reaction causing severe rash involving mucus membranes or skin necrosis: no Has patient had a PCN reaction that required hospitalization: no Has patient had a PCN reaction occurring within the last 10 years: yes If all of the above answers are "NO", then  may proceed with Cephalosporin use.       CT Head Wo Contrast  Result Date: 09/16/2020 CLINICAL DATA:  Trauma EXAM: CT HEAD WITHOUT CONTRAST CT CERVICAL SPINE WITHOUT CONTRAST TECHNIQUE: Multidetector CT imaging of the head and cervical spine was performed following the standard protocol without intravenous contrast. Multiplanar CT image reconstructions of the cervical spine were also generated. COMPARISON:  None. FINDINGS: CT HEAD FINDINGS Brain: No evidence of acute large vascular territory infarction, hemorrhage, hydrocephalus, extra-axial collection or mass lesion/mass effect. Bilateral basal ganglia mineralization, somewhat advanced in extent for age. No edema in this region to suggest superimposed acute hemorrhage. Vascular: No hyperdense vessel identified. Mild calcific atherosclerosis. Skull: No acute fracture. Sinuses/Orbits: Right frontal sinus osteoma. Mild ethmoid air cell mucosal thickening. Unremarkable orbits. Other: No mastoid effusions. CT CERVICAL SPINE FINDINGS Alignment: Straightening of the normal cervical lordosis. No substantial sagittal subluxation. Mild rotation of C1 on C2. Skull base and vertebrae: Vertebral body heights are maintained. Soft tissues and spinal canal: No prevertebral fluid or swelling. No visible canal hematoma. Disc levels: Moderate to severe degenerative disease at C5-C6 and C6-C7 with disc height loss, endplate sclerosis and posterior disc osteophyte complexes. At least moderate left foraminal stenosis at these levels (potentially severe on the left at C6-C7). Upper chest: Partially imaged dependent ground-glass opacity in the left upper lung, possibly atelectasis. IMPRESSION: CT head: 1. No evidence of acute intracranial abnormality. 2. Bilateral basal ganglia mineralization, somewhat advanced in extent for age. Consider correlation with calcium/parathyroid labs. CT cervical spine: 1. No evidence of acute fracture. 2. Mild rotation of C1 on C2, most likely  positional in the absence of a fixed torticollis. 3. Moderate to severe degenerative disc disease at C5-C6 and C6-C7 with at least mild canal and moderate left foraminal stenosis at these levels (potentially severe on the left at C6-C7). An MRI could better evaluate the canal, cord and foramina if clinically indicated. Electronically Signed   By: Feliberto Harts MD   On: 09/16/2020 19:45   CT Cervical Spine Wo Contrast  Result Date: 09/16/2020 CLINICAL DATA:  Trauma EXAM: CT HEAD WITHOUT CONTRAST CT CERVICAL SPINE WITHOUT CONTRAST TECHNIQUE: Multidetector CT imaging of the head and cervical spine was performed following the standard protocol without intravenous contrast. Multiplanar CT image reconstructions of the  cervical spine were also generated. COMPARISON:  None. FINDINGS: CT HEAD FINDINGS Brain: No evidence of acute large vascular territory infarction, hemorrhage, hydrocephalus, extra-axial collection or mass lesion/mass effect. Bilateral basal ganglia mineralization, somewhat advanced in extent for age. No edema in this region to suggest superimposed acute hemorrhage. Vascular: No hyperdense vessel identified. Mild calcific atherosclerosis. Skull: No acute fracture. Sinuses/Orbits: Right frontal sinus osteoma. Mild ethmoid air cell mucosal thickening. Unremarkable orbits. Other: No mastoid effusions. CT CERVICAL SPINE FINDINGS Alignment: Straightening of the normal cervical lordosis. No substantial sagittal subluxation. Mild rotation of C1 on C2. Skull base and vertebrae: Vertebral body heights are maintained. Soft tissues and spinal canal: No prevertebral fluid or swelling. No visible canal hematoma. Disc levels: Moderate to severe degenerative disease at C5-C6 and C6-C7 with disc height loss, endplate sclerosis and posterior disc osteophyte complexes. At least moderate left foraminal stenosis at these levels (potentially severe on the left at C6-C7). Upper chest: Partially imaged dependent ground-glass  opacity in the left upper lung, possibly atelectasis. IMPRESSION: CT head: 1. No evidence of acute intracranial abnormality. 2. Bilateral basal ganglia mineralization, somewhat advanced in extent for age. Consider correlation with calcium/parathyroid labs. CT cervical spine: 1. No evidence of acute fracture. 2. Mild rotation of C1 on C2, most likely positional in the absence of a fixed torticollis. 3. Moderate to severe degenerative disc disease at C5-C6 and C6-C7 with at least mild canal and moderate left foraminal stenosis at these levels (potentially severe on the left at C6-C7). An MRI could better evaluate the canal, cord and foramina if clinically indicated. Electronically Signed   By: Feliberto Harts MD   On: 09/16/2020 19:45   MR ANGIO HEAD WO CONTRAST  Result Date: 09/19/2020 CLINICAL DATA:  Follow-up acute infarction left caudate and left parietal cortex EXAM: MRA HEAD WITHOUT CONTRAST MRA NECK WITHOUT CONTRAST TECHNIQUE: Angiographic images of the Circle of Willis were obtained using MRA technique without intravenous contrast. Angiographic images of the neck were obtained using MRA technique without intravenous contrast. Carotid stenosis measurements (when applicable) are obtained utilizing NASCET criteria, using the distal internal carotid diameter as the denominator. COMPARISON:  MRI yesterday. FINDINGS: MRA HEAD FINDINGS Both internal carotid arteries widely patent through the skull base and siphon regions. On the right, the anterior and middle cerebral arteries are patent and normal. On the left, there is moderate to severe stenosis at both the A1 and M1 segments, with considerable irregularity particularly of the M1 segment. The patient would be at risk of additional infarction in these vascular territories. Both vertebral arteries widely patent to the basilar. No basilar stenosis. Posterior circulation branch vessels are normal. Left PCA takes fetal origin from the anterior circulation. Large  posterior communicating artery on the right. MRA NECK FINDINGS Branching pattern from the arch is normal. Both common carotid arteries are widely patent to the bifurcation. Both carotid bifurcations are widely patent. There may be minimal nonstenotic plaque at the ICA bulb on the left. Both vertebral arteries widely patent from their origins to the basilar. IMPRESSION: No advanced finding in the neck. Minimal atherosclerotic plaque at the ICA bulb on the left but without stenosis. Intracranial MR angiography shows severe stenosis of the left A1 and M1 segments, with considerable irregularity of the M1 segment. Patient would be at risk of additional infarction in these vascular territories. Electronically Signed   By: Paulina Fusi M.D.   On: 09/19/2020 15:55   MR ANGIO NECK WO CONTRAST  Result Date: 09/19/2020 CLINICAL DATA:  Follow-up acute infarction left caudate and left parietal cortex EXAM: MRA HEAD WITHOUT CONTRAST MRA NECK WITHOUT CONTRAST TECHNIQUE: Angiographic images of the Circle of Willis were obtained using MRA technique without intravenous contrast. Angiographic images of the neck were obtained using MRA technique without intravenous contrast. Carotid stenosis measurements (when applicable) are obtained utilizing NASCET criteria, using the distal internal carotid diameter as the denominator. COMPARISON:  MRI yesterday. FINDINGS: MRA HEAD FINDINGS Both internal carotid arteries widely patent through the skull base and siphon regions. On the right, the anterior and middle cerebral arteries are patent and normal. On the left, there is moderate to severe stenosis at both the A1 and M1 segments, with considerable irregularity particularly of the M1 segment. The patient would be at risk of additional infarction in these vascular territories. Both vertebral arteries widely patent to the basilar. No basilar stenosis. Posterior circulation branch vessels are normal. Left PCA takes fetal origin from the  anterior circulation. Large posterior communicating artery on the right. MRA NECK FINDINGS Branching pattern from the arch is normal. Both common carotid arteries are widely patent to the bifurcation. Both carotid bifurcations are widely patent. There may be minimal nonstenotic plaque at the ICA bulb on the left. Both vertebral arteries widely patent from their origins to the basilar. IMPRESSION: No advanced finding in the neck. Minimal atherosclerotic plaque at the ICA bulb on the left but without stenosis. Intracranial MR angiography shows severe stenosis of the left A1 and M1 segments, with considerable irregularity of the M1 segment. Patient would be at risk of additional infarction in these vascular territories. Electronically Signed   By: Paulina Fusi M.D.   On: 09/19/2020 15:55   MR BRAIN WO CONTRAST  Result Date: 09/19/2020 CLINICAL DATA:  Initial evaluation for acute seizure, abnormal neuro exam. EXAM: MRI HEAD WITHOUT CONTRAST TECHNIQUE: Multiplanar, multiecho pulse sequences of the brain and surrounding structures were obtained without intravenous contrast. COMPARISON:  Prior CT from 09/16/2020. FINDINGS: Brain: Cerebral volume within normal limits for age. Minimal T2/FLAIR hyperintensity noted involving the periventricular white matter, nonspecific, but most likely related to chronic microvascular ischemic disease, extremely mild for age. 1.2 cm focus of diffusion abnormality seen involving the left caudate, consistent with an acute to early subacute ischemic infarct (series 5, image 83). No associated hemorrhage or mass effect. No other diffusion abnormality to suggest acute or subacute ischemia or changes related to seizure. Gray-white matter differentiation otherwise maintained. No encephalomalacia to suggest chronic cortical infarction elsewhere within the brain. No other evidence for acute or chronic intracranial hemorrhage. No mass lesion, midline shift or mass effect. No hydrocephalus or  extra-axial fluid collection. Pituitary gland suprasellar region within normal limits. Midline structures intact. No intrinsic temporal lobe abnormality. Vascular: Major intracranial vascular flow voids are maintained. Skull and upper cervical spine: Craniocervical junction within normal limits. Bone marrow signal intensity within normal limits. Susceptibility artifact emanates from the left occipital scalp. Scalp soft tissues demonstrate no other acute finding. Sinuses/Orbits: Globes and orbital soft tissues demonstrate no acute finding. Paranasal sinuses are largely clear. Small to moderate bilateral mastoid effusions. Visualized nasopharynx within normal limits. Other: None. IMPRESSION: 1. 1.2 cm focus of diffusion abnormality involving the left caudate, consistent with an acute to early subacute ischemic infarct. No associated hemorrhage or mass effect. 2. No other acute intracranial abnormality. Electronically Signed   By: Rise Mu M.D.   On: 09/19/2020 00:58   DG Chest Port 1 View  Result Date: 09/18/2020 CLINICAL DATA:  Leukocytosis.  History  of trauma. EXAM: PORTABLE CHEST 1 VIEW COMPARISON:  Chest x-ray 09/16/2020. FINDINGS: Right PICC line noted with its tip over the lower right atrium. Proximal repositioning of approximately 8 cm should be considered. Endotracheal tube and NG tube in stable position. Heart size stable. Persistent right base atelectasis/infiltrate. Tiny right pleural effusion cannot be excluded. No pneumothorax. A fracture of the posterolateral aspect of the left fifth rib cannot be excluded. IMPRESSION: 1. Right PICC line noted with its tip over the lower right atrium. Proximal repositioning of approximately 8 cm should be considered. Endotracheal tube and NG tube in stable position. 2. Persistent right base atelectasis/infiltrate. Tiny right pleural effusion cannot be excluded. 3. A fracture of the posterolateral aspect of left rib cannot be excluded. No pneumothorax.  Electronically Signed   By: Maisie Fushomas  Register   On: 09/18/2020 05:53   DG Chest Portable 1 View  Result Date: 09/16/2020 CLINICAL DATA:  Intubated EXAM: PORTABLE CHEST 1 VIEW COMPARISON:  Chest radiograph from earlier today. FINDINGS: Endotracheal tube tip is 4.1 cm above the carina. Enteric tube enters stomach with the tip not seen on this image. Stable cardiomediastinal silhouette with normal heart size. No pneumothorax. Stable small right pleural effusion. No left pleural effusion. Increased curvilinear right lung base atelectasis. Stable mild elevation of the right hemidiaphragm. No pulmonary edema. IMPRESSION: 1. Well-positioned endotracheal and enteric tubes. 2. Stable small right pleural effusion. 3. Increased curvilinear right lung base atelectasis. Electronically Signed   By: Delbert PhenixJason A Poff M.D.   On: 09/16/2020 19:04   DG Chest Port 1 View  Result Date: 09/16/2020 CLINICAL DATA:  Altered level of consciousness. EXAM: PORTABLE CHEST 1 VIEW COMPARISON:  None available. FINDINGS: Patient is rotated. Lung volumes are low. Upper normal heart size likely accentuated by technique. Elevation of right hemidiaphragm. Adjacent right basilar opacity. No pulmonary edema. No pneumothorax. No large pleural effusion. No acute osseous abnormalities are seen IMPRESSION: Rotated exam with low lung volumes. Elevation of right hemidiaphragm with adjacent right basilar opacity, probable atelectasis. Electronically Signed   By: Narda RutherfordMelanie  Sanford M.D.   On: 09/16/2020 18:23   Overnight EEG with video  Result Date: 09/17/2020 Charlsie QuestYadav, Priyanka O, MD     09/18/2020  8:51 AM Patient Name: Volcano Desanctisracey Barreira MRN: [id number] Epilepsy Attending: Charlsie QuestPriyanka O Yadav Referring Physician/Provider: Dr Erick BlinksSalman Khaliqdina Duration: 09/17/2020 0053 to 09/18/2020 0053   Patient history: 55 y.o. female with PMH significant for diabetes, HTN who was found down and had several seizures with clustering. EEG to evaluate for seizure.   Level of alertness:  asleep/sedated   AEDs during EEG study: Propofol, Versed, LEV   Technical aspects: This EEG study was done with scalp electrodes positioned according to the 10-20 International system of electrode placement. Electrical activity was acquired at a sampling rate of 500Hz  and reviewed with a high frequency filter of 70Hz  and a low frequency filter of 1Hz . EEG data were recorded continuously and digitally stored.   Description: EEG initially showed burst suppression pattern with generalized, maximal frontal bursts of sharply contoured 15-18Hz  beta activity lasting 2-3 seconds alternating with 3-5 seconds of generalized eeg suppression. Gradually after sedation was weaned off, the duration of EEG suppression became shorter and eventually EEG showed continuous generalized 2 to 5 Hz theta-delta slowing. ABNORMALITY -Burst suppression, generalized -Continuous slow, generalized   IMPRESSION: This study was initially suggestive of profound diffuse encephalopathy, nonspecific etiology but likely related to sedation.   Gradually as sedation was weaned, EEG was suggestive of moderate to severe diffuse  encephalopathy.  No seizures or epileptiform discharges were seen throughout the recording.    Charlsie Quest    ECHOCARDIOGRAM COMPLETE  Result Date: 09/20/2020    ECHOCARDIOGRAM REPORT   Patient Name:   Tricia Bridges Date of Exam: 09/20/2020 Medical Rec #:  [id number]      Height:       63.0 in Accession #:    [id number]     Weight:       136.2 lb Date of Birth:  1965-09-26       BSA:          1.643 m Patient Age:    54 years       BP:           144/92 mmHg Patient Gender: F              HR:           99 bpm. Exam Location:  Inpatient Procedure: 2D Echo, Cardiac Doppler and Color Doppler Indications:    CVA  History:        Patient has no prior history of Echocardiogram examinations.                 Signs/Symptoms:Fever. Epilepsy.  Sonographer:    Lavenia Atlas Referring Phys: [id number] PRIYANKA O YADAV IMPRESSIONS  1. Left  ventricular ejection fraction, by estimation, is 60 to 65%. The left ventricle has normal function. The left ventricle has no regional wall motion abnormalities. There is mild concentric left ventricular hypertrophy of the septal and basal-septal segments. Left ventricular diastolic parameters are consistent with Grade I diastolic dysfunction (impaired relaxation).  2. Right ventricular systolic function is normal. The right ventricular size is normal. Tricuspid regurgitation signal is inadequate for assessing PA pressure.  3. The mitral valve is normal in structure. No evidence of mitral valve regurgitation. No evidence of mitral stenosis.  4. The aortic valve is tricuspid. Aortic valve regurgitation is not visualized. Mild to moderate aortic valve sclerosis/calcification is present, without any evidence of aortic stenosis.  5. The inferior vena cava is normal in size with greater than 50% respiratory variability, suggesting right atrial pressure of 3 mmHg. FINDINGS  Left Ventricle: Left ventricular ejection fraction, by estimation, is 60 to 65%. The left ventricle has normal function. The left ventricle has no regional wall motion abnormalities. The left ventricular internal cavity size was normal in size. There is  mild concentric left ventricular hypertrophy of the septal and basal-septal segments. Left ventricular diastolic parameters are consistent with Grade I diastolic dysfunction (impaired relaxation). Normal left ventricular filling pressure. Right Ventricle: The right ventricular size is normal. No increase in right ventricular wall thickness. Right ventricular systolic function is normal. Tricuspid regurgitation signal is inadequate for assessing PA pressure. Left Atrium: Left atrial size was normal in size. Right Atrium: Right atrial size was normal in size. Pericardium: There is no evidence of pericardial effusion. Mitral Valve: The mitral valve is normal in structure. No evidence of mitral valve  regurgitation. No evidence of mitral valve stenosis. Tricuspid Valve: The tricuspid valve is normal in structure. Tricuspid valve regurgitation is not demonstrated. No evidence of tricuspid stenosis. Aortic Valve: The aortic valve is tricuspid. Aortic valve regurgitation is not visualized. Mild to moderate aortic valve sclerosis/calcification is present, without any evidence of aortic stenosis. Pulmonic Valve: The pulmonic valve was normal in structure. Pulmonic valve regurgitation is not visualized. No evidence of pulmonic stenosis. Aorta: The aortic root is normal in size and structure. Venous:  The inferior vena cava is normal in size with greater than 50% respiratory variability, suggesting right atrial pressure of 3 mmHg. IAS/Shunts: No atrial level shunt detected by color flow Doppler.  LEFT VENTRICLE PLAX 2D LVIDd:         4.10 cm      Diastology LVIDs:         2.80 cm      LV e' medial:    5.22 cm/s LV PW:         1.20 cm      LV E/e' medial:  12.9 LV IVS:        1.30 cm      LV e' lateral:   6.31 cm/s LVOT diam:     2.10 cm      LV E/e' lateral: 10.7 LV SV:         61 LV SV Index:   37 LVOT Area:     3.46 cm  LV Volumes (MOD) LV vol d, MOD A4C: 104.0 ml LV vol s, MOD A4C: 27.9 ml LV SV MOD A4C:     104.0 ml RIGHT VENTRICLE RV Basal diam:  2.80 cm RV S prime:     10.40 cm/s TAPSE (M-mode): 1.6 cm LEFT ATRIUM             Index       RIGHT ATRIUM          Index LA diam:        3.20 cm 1.95 cm/m  RA Area:     8.29 cm LA Vol (A2C):   29.5 ml 17.96 ml/m RA Volume:   14.70 ml 8.95 ml/m LA Vol (A4C):   39.8 ml 24.23 ml/m LA Biplane Vol: 34.5 ml 21.00 ml/m  AORTIC VALVE LVOT Vmax:   99.00 cm/s LVOT Vmean:  68.000 cm/s LVOT VTI:    0.175 m  AORTA Ao Root diam: 2.50 cm MITRAL VALVE MV Area (PHT): 3.85 cm     SHUNTS MV Decel Time: 197 msec     Systemic VTI:  0.18 m MV E velocity: 67.40 cm/s   Systemic Diam: 2.10 cm MV A velocity: 111.00 cm/s MV E/A ratio:  0.61 Armanda Magic MD Electronically signed by Armanda Magic MD Signature Date/Time: 09/20/2020/1:51:44 PM    Final    [place] EKG SITE RITE  Result Date: 09/17/2020 If Site Rite image not attached, placement could not be confirmed due to current cardiac rhythm.    The results of significant diagnostics from this hospitalization (including imaging, microbiology, ancillary and laboratory) are listed below for reference.     Microbiology: Recent Results (from the past 240 hour(s))  Culture, blood (routine x 2)     Status: None   Collection Time: 09/16/20  5:48 PM   Specimen: BLOOD  Result Value Ref Range Status   Specimen Description BLOOD BLOOD RIGHT FOREARM  Final   Special Requests   Final    BOTTLES DRAWN AEROBIC AND ANAEROBIC Blood Culture results may not be optimal due to an inadequate volume of blood received in culture bottles   Culture   Final    NO GROWTH 5 DAYS Performed at [facility], [address], [place], [place] [address]    Report Status 09/21/2020 FINAL  Final  SARS CORONAVIRUS 2 (TAT 6-24 HRS) Nasopharyngeal Nasopharyngeal Swab     Status: None   Collection Time: 09/16/20  6:39 PM   Specimen: Nasopharyngeal Swab  Result Value Ref Range Status  SARS Coronavirus 2 NEGATIVE NEGATIVE Final    Comment: (NOTE) SARS-CoV-2 target nucleic acids are NOT DETECTED.  The SARS-CoV-2 RNA is generally detectable in upper and lower respiratory specimens during the acute phase of infection. Negative results do not preclude SARS-CoV-2 infection, do not rule out co-infections with other pathogens, and should not be used as the sole basis for treatment or other patient management decisions. Negative results must be combined with clinical observations, patient history, and epidemiological information. The expected result is Negative.  Fact Sheet for Patients: HairSlick.no  Fact Sheet for Healthcare Providers: quierodirigir.com  This test is not yet approved or cleared by  the [place] FDA and  has been authorized for detection and/or diagnosis of SARS-CoV-2 by FDA under an Emergency Use Authorization (EUA). This EUA will remain  in effect (meaning this test can be used) for the duration of the COVID-19 declaration under Se ction 564(b)(1) of the Act, 21 U.S.C. section 360bbb-3(b)(1), unless the authorization is terminated or revoked sooner.  Performed at [facility], [address] [place], [place] [address]   Culture, blood (routine x 2)     Status: None   Collection Time: 09/16/20  9:20 PM   Specimen: BLOOD  Result Value Ref Range Status   Specimen Description BLOOD SITE NOT SPECIFIED  Final   Special Requests   Final    BOTTLES DRAWN AEROBIC AND ANAEROBIC Blood Culture results may not be optimal due to an inadequate volume of blood received in culture bottles   Culture   Final    NO GROWTH 5 DAYS Performed at [facility], [address] [place], [place] [address]    Report Status 09/21/2020 FINAL  Final  MRSA PCR Screening     Status: None   Collection Time: 09/16/20 10:28 PM   Specimen: Nasal Mucosa; Nasopharyngeal  Result Value Ref Range Status   MRSA by PCR NEGATIVE NEGATIVE Final    Comment:        The GeneXpert MRSA Assay (FDA approved for NASAL specimens only), is one component of a comprehensive MRSA colonization surveillance program. It is not intended to diagnose MRSA infection nor to guide or monitor treatment for MRSA infections. Performed at [facility], [address] [place], [place] [address]      Labs: BNP (last 3 results) No results for input(s): BNP in the last 8760 hours. Basic Metabolic Panel: Recent Labs  Lab 09/17/20 0336 09/17/20 0416 09/18/20 0546 09/18/20 0935 09/18/20 1635 09/19/20 0322 09/20/20 0500 09/21/20 0339 09/22/20 0039  NA 145   < > 146* 143 141 138 138 138 140  K 3.9   < > 4.1 3.3* 3.2* 3.4* 5.1 3.9 3.5  CL 109   < > 127* 109  --  99 106 104 106  CO2 22   <  > 15* 23  --  24 22 23 26   GLUCOSE 195*   < > 77 143*  --  135* 82 129* 105*  BUN 13   < > 5* 6  --  5* 7 8 10   CREATININE 0.91   < > 0.44 0.68  --  0.71 0.56 0.71 0.74  CALCIUM 9.5   < > 5.4* 8.8*  --  9.0 8.2* 8.9 8.8*  MG 2.3  --  1.9 1.8  --  1.8 1.7  --   --   PHOS 2.3*  --  3.2 3.8  --  4.5 3.1  --   --    < > =  values in this interval not displayed.   Liver Function Tests: Recent Labs  Lab 09/16/20 1748  AST 32  ALT 29  ALKPHOS 135*  BILITOT 0.5  PROT 7.4  ALBUMIN 3.7   No results for input(s): LIPASE, AMYLASE in the last 168 hours. Recent Labs  Lab 09/16/20 2336  AMMONIA 27   CBC: Recent Labs  Lab 09/16/20 1748 09/16/20 1809 09/17/20 0336 09/17/20 0416 09/17/20 1719 09/18/20 0546 09/18/20 1635 09/19/20 0322 09/20/20 0500  WBC 20.0*  --  18.9*  --   --  14.8*  --  12.7* 7.6  NEUTROABS 18.2*  --   --   --   --   --   --   --   --   HGB 15.5*   < > 16.0*   < > 13.3 13.5 14.3 14.6 14.3  HCT 49.1*   < > 47.1*   < > 39.0 40.5 42.0 44.4 43.1  MCV 90.9  --  86.1  --   --  87.9  --  88.4 88.3  PLT 246  --  204  --   --  165  --  172 171   < > = values in this interval not displayed.   Cardiac Enzymes: Recent Labs  Lab 09/18/20 0546 09/19/20 1505 09/20/20 0500 09/21/20 0339 09/22/20 0823  CKTOTAL 6,709* 15,801* 10,234* 9,695* 3,074*   BNP: Invalid input(s): POCBNP CBG: Recent Labs  Lab 09/21/20 1634 09/21/20 2055 09/22/20 0750 09/22/20 0827 09/22/20 1151  GLUCAP 148* 131* 58* 120* 225*   D-Dimer No results for input(s): DDIMER in the last 72 hours. Hgb A1c No results for input(s): HGBA1C in the last 72 hours. Lipid Profile No results for input(s): CHOL, HDL, LDLCALC, TRIG, CHOLHDL, LDLDIRECT in the last 72 hours. Thyroid function studies No results for input(s): TSH, T4TOTAL, T3FREE, THYROIDAB in the last 72 hours.  Invalid input(s): FREET3 Anemia work up No results for input(s): VITAMINB12, FOLATE, FERRITIN, TIBC, IRON, RETICCTPCT in the  last 72 hours. Urinalysis    Component Value Date/Time   COLORURINE STRAW (A) 06/15/2016 1711   APPEARANCEUR CLEAR 06/15/2016 1711   LABSPEC 1.026 06/15/2016 1711   PHURINE 5.0 06/15/2016 1711   GLUCOSEU >=500 (A) 06/15/2016 1711   HGBUR NEGATIVE 06/15/2016 1711   BILIRUBINUR NEGATIVE 06/15/2016 1711   KETONESUR 5 (A) 06/15/2016 1711   PROTEINUR NEGATIVE 06/15/2016 1711   NITRITE NEGATIVE 06/15/2016 1711   LEUKOCYTESUR NEGATIVE 06/15/2016 1711   Sepsis Labs Invalid input(s): PROCALCITONIN,  WBC,  LACTICIDVEN Microbiology Recent Results (from the past 240 hour(s))  Culture, blood (routine x 2)     Status: None   Collection Time: 09/16/20  5:48 PM   Specimen: BLOOD  Result Value Ref Range Status   Specimen Description BLOOD BLOOD RIGHT FOREARM  Final   Special Requests   Final    BOTTLES DRAWN AEROBIC AND ANAEROBIC Blood Culture results may not be optimal due to an inadequate volume of blood received in culture bottles   Culture   Final    NO GROWTH 5 DAYS Performed at [facility], [address], [place], [place] [address]    Report Status 09/21/2020 FINAL  Final  SARS CORONAVIRUS 2 (TAT 6-24 HRS) Nasopharyngeal Nasopharyngeal Swab     Status: None   Collection Time: 09/16/20  6:39 PM   Specimen: Nasopharyngeal Swab  Result Value Ref Range Status   SARS Coronavirus 2 NEGATIVE NEGATIVE Final    Comment: (NOTE) SARS-CoV-2 target nucleic acids  are NOT DETECTED.  The SARS-CoV-2 RNA is generally detectable in upper and lower respiratory specimens during the acute phase of infection. Negative results do not preclude SARS-CoV-2 infection, do not rule out co-infections with other pathogens, and should not be used as the sole basis for treatment or other patient management decisions. Negative results must be combined with clinical observations, patient history, and epidemiological information. The expected result is Negative.  Fact Sheet for  Patients: HairSlick.no  Fact Sheet for Healthcare Providers: quierodirigir.com  This test is not yet approved or cleared by the [place] FDA and  has been authorized for detection and/or diagnosis of SARS-CoV-2 by FDA under an Emergency Use Authorization (EUA). This EUA will remain  in effect (meaning this test can be used) for the duration of the COVID-19 declaration under Se ction 564(b)(1) of the Act, 21 U.S.C. section 360bbb-3(b)(1), unless the authorization is terminated or revoked sooner.  Performed at [facility], [address], [place], [place] [address]   Culture, blood (routine x 2)     Status: None   Collection Time: 09/16/20  9:20 PM   Specimen: BLOOD  Result Value Ref Range Status   Specimen Description BLOOD SITE NOT SPECIFIED  Final   Special Requests   Final    BOTTLES DRAWN AEROBIC AND ANAEROBIC Blood Culture results may not be optimal due to an inadequate volume of blood received in culture bottles   Culture   Final    NO GROWTH 5 DAYS Performed at [facility], [address], [place], [place] [address]    Report Status 09/21/2020 FINAL  Final  MRSA PCR Screening     Status: None   Collection Time: 09/16/20 10:28 PM   Specimen: Nasal Mucosa; Nasopharyngeal  Result Value Ref Range Status   MRSA by PCR NEGATIVE NEGATIVE Final    Comment:        The GeneXpert MRSA Assay (FDA approved for NASAL specimens only), is one component of a comprehensive MRSA colonization surveillance program. It is not intended to diagnose MRSA infection nor to guide or monitor treatment for MRSA infections. Performed at [facility], [address], [place], [place] [address]      Time coordinating discharge in minutes: 65  SIGNED:   Calvert Cantor, MD  [facility] 09/22/2020, 1:59 PM

## 2020-09-22 NOTE — TOC Transition Note (Signed)
Transition of Care (TOC) - CM/SW Discharge Note Donn Pierini RN,BSN Transitions of Care Unit 4NP (non trauma) - RN Case Manager See Treatment Team for direct Phone #    Patient Details  Name: Tricia Bridges MRN: [id number] Date of Birth: 1965-10-26  Transition of Care [facility]) CM/SW Contact:  Darrold Span, RN Phone Number: 09/22/2020, 4:50 PM   Clinical Narrative:    Pt stable for transition home today, referral made to outpt neuro for PT/OT/SLP. Pt provided info on outpt rehab. Pt also provided [facility] letter for medication assistance. Meds have been sent to [employer] MATCH program explained. Pt voiced appreciation.  Per pt she plans to f/u with primary care in [place]- pt also provided info on [facility] should she change her mind.    Final next level of care: OP Rehab Barriers to Discharge: No Barriers Identified   Patient Goals and CMS Choice Patient states their goals for this hospitalization and ongoing recovery are:: return home      Discharge Placement                 Home      Discharge Plan and Services   Discharge Planning Services: CM Consult, Medication Assistance, MATCH Program, Indigent Health Clinic Post Acute Care Choice: NA          DME Arranged: N/A DME Agency: NA       HH Arranged: NA HH Agency: NA        Social Determinants of Health (SDOH) Interventions     Readmission Risk Interventions Readmission Risk Prevention Plan 09/22/2020  Post Dischage Appt Complete  Medication Screening Complete  Transportation Screening Complete  Some recent data might be hidden

## 2020-09-22 NOTE — Discharge Instructions (Signed)

## 2020-09-23 ENCOUNTER — Telehealth: Payer: Self-pay | Admitting: *Deleted

## 2020-09-23 NOTE — Telephone Encounter (Signed)
Pt daughter called regarding Pen needles for insulin not being called/sent in for pt.  RNCM reviewed chart to confirm insulin refill was ordered.  RNCM called in pen needles as requested.

## 2020-09-25 NOTE — Telephone Encounter (Signed)
Patient did not want the appointment at this time.

## 2020-09-26 LAB — BENZODIAZEPINES,MS,WB/SP RFX
7-Aminoclonazepam: NEGATIVE ng/mL
Alprazolam: NEGATIVE ng/mL
Benzodiazepines Confirm: POSITIVE
Chlordiazepoxide: NEGATIVE
Clonazepam: NEGATIVE ng/mL
Desalkylflurazepam: NEGATIVE ng/mL
Desmethylchlordiazepoxide: NEGATIVE
Desmethyldiazepam: NEGATIVE ng/mL
Diazepam: NEGATIVE ng/mL
Flurazepam: NEGATIVE ng/mL
Lorazepam: 72 ng/mL
Midazolam: 41.4 ng/mL
Oxazepam: NEGATIVE ng/mL
Temazepam: NEGATIVE ng/mL
Triazolam: NEGATIVE ng/mL

## 2020-09-26 LAB — DRUG SCREEN 10 W/CONF, SERUM
Amphetamines, IA: NEGATIVE ng/mL
Barbiturates, IA: NEGATIVE ug/mL
Benzodiazepines, IA: POSITIVE ng/mL — AB
Cocaine & Metabolite, IA: NEGATIVE ng/mL
Methadone, IA: NEGATIVE ng/mL
Opiates, IA: NEGATIVE ng/mL
Oxycodones, IA: NEGATIVE ng/mL
Phencyclidine, IA: NEGATIVE ng/mL
Propoxyphene, IA: NEGATIVE ng/mL
THC(Marijuana) Metabolite, IA: NEGATIVE ng/mL

## 2020-09-27 NOTE — Discharge Summary (Addendum)
Physician Discharge Summary  Tricia Bridges QQV:[id number] DOB: 1966/02/23 DOA: 09/16/2020   This note was accidentally documented under the heading of progress note. I have copied it and placed it under the heading of Discharge summary.   PCP: Pcp, No   Admit date: 09/16/2020 Discharge date: 09/22/2020   Admitted From: home  Disposition:  home    Recommendations for Outpatient Follow-up:  Outpt neuro-cognitive testing recommended She has no insurance and a Social work consult has been request to help with meds, Outpt therapies and a PCP   Home Health:  none  Discharge Condition:  stable   CODE STATUS:  Full code   Diet recommendation:  heart healthy, diabetic Consultations: neurology  Procedures/Studies: EEG Intubation/Extubation     Discharge Diagnoses:  Principal Problem:   Status epilepticus (HCC) Active Problems:   Type 2 diabetes mellitus without complication, with long-term current use of insulin (HCC)   Essential hypertension   Acute respiratory failure with hypercapnia (HCC)   HHNC (hyperglycemic hyperosmolar nonketotic coma) (HCC)   Acute metabolic encephalopathy   Non-traumatic rhabdomyolysis         Brief Summary: Tricia Bridges is a 55 year old female who presented to [facility] in status epilepticus.  No prior history of seizures.  EMS witnessed 2 episodes of seizures in addition to at least 1 seizure at home.  The patient was given Versed.  In the ED the patient was noted to seize twice.  She was intubated and admitted to the ICU.  Propofol used for seizure suppression. In the ED CBG was in the 600s, WBC count was 20.  She had acute respiratory failure with hypercarbia (pH 6.9 and PCO2 103), CBG of 623, lactic acidosis with a level of 5.7, fever of 100. Started on an insulin infusion.   She admitted later in the hospital stay that she had stopped all of her medications- unable to tell me when but daughter states is was "not that long ago".    Hospital  Course:  Principal Problem:   Status epilepticus /acute/subacute CVA -Appreciate neurology management -Status post LTM showing burst suppression  -Now on 500 mg of Keppra twice daily which she will be discharged on -MRI performed on 6/8> 1.2 cm focus of diffusion abnormality involving the left caudate, consistent with an acute to early subacute ischemic infarct - MRA head and neck> Intracranial MR angiography shows severe stenosis of the left A1 and M1 segments -I suspect seizures may be secondary to the CVA -2D echo does not reveal a thrombus-she has grade 1 diastolic dysfunction-full report below - LDL 179- Goal < 70- statin started - A1c > 15 -She has been started ASA 81 mg and on Plavix 75 mg daily.  Plavix should be discontinued in 3 months. Aspirin is a home med which she had stopped taking and is going to be continued after Plavix stopped(per neuro) -Follow-up with neurology recommended in 3 months - Outpatient PT/OT/SLP recommended and ordered   Active Problems:   Acute metabolic encephalopathy - alert  but has cognitive impairment - SLUM ([employer] Mental status exam)> 16/30 -  I recommend a full cognitive evaluation as outpt   Essential hypertension -Continue amlodipine and Enalapril- Stop HCTZ   Type 2 diabetes mellitus , uncontrolled with hyperglycemia with long-term current use of insulin -HBA1c > 15.5 -She is supposed to be taking Lantus and Glucophage as outpatient but has not been -Continued on Lantus and sliding scale insulin in the hospital - resumed Lantus and Glucophage  for home use     Acute respiratory failure with hypercapnia-vent dependent respiratory failure ?  Aspiration pneumonia secondary to seizures - noted fever on admission, leukocytosis and right sided infiltrate/atelectasis on xray --5 day course ceftriaxone completed on 6/11 - she has a mild non- productive cough- no fever or tachypnea and no leukocytosis    Hypokalemia -   replaced    Rhabdomyolysis due to seizures  -CK 6709 > 15, 801> 10, 234 > 9,695> 3074  - treated with IVF- renal function remained stable     Discharge Exam:     Vitals:    09/22/20 0700 09/22/20 1100  BP: 120/82 123/80  Pulse:      Resp: 14    Temp: 98.1 F (36.7 C) 97.7 F (36.5 C)  SpO2:              Vitals:    09/22/20 0037 09/22/20 0415 09/22/20 0700 09/22/20 1100  BP: 118/83 131/84 120/82 123/80  Pulse: 86 76      Resp: 19 12 14     Temp: 98.5 F (36.9 C) 98.5 F (36.9 C) 98.1 F (36.7 C) 97.7 F (36.5 C)  TempSrc: Oral Oral Oral Oral  SpO2: 98% 96%      Weight:          Height:              General: Pt is alert, awake, not in acute distress Cardiovascular: RRR, S1/S2 +, no rubs, no gallops Respiratory: CTA bilaterally, no wheezing, no rhonchi Abdominal: Soft, NT, ND, bowel sounds + Extremities: no edema, no cyanosis     Discharge Instructions   Discharge Instructions       Diet - low sodium heart healthy   Complete by: As directed      Diet Carb Modified   Complete by: As directed      Discharge instructions   Complete by: As directed      Stop Plavix in 3 wks.    Increase activity slowly   Complete by: As directed           Allergies as of 09/22/2020         Reactions    Penicillins Itching    Has patient had a PCN reaction causing immediate rash, facial/tongue/throat swelling, SOB or lightheadedness with hypotension: no Has patient had a PCN reaction causing severe rash involving mucus membranes or skin necrosis: no Has patient had a PCN reaction that required hospitalization: no Has patient had a PCN reaction occurring within the last 10 years: yes If all of the above answers are "NO", then may proceed with Cephalosporin use.            Medication List       STOP taking these medications     hydrochlorothiazide 12.5 MG tablet Commonly known as: HYDRODIURIL           TAKE these medications     amLODipine 5 MG tablet Commonly known as:  NORVASC TAKE 1 TABLET(5 MG) BY MOUTH DAILY    aspirin EC 81 MG tablet Take 1 tablet (81 mg total) by mouth daily.    atorvastatin 40 MG tablet Commonly known as: LIPITOR Take 1 tablet (40 mg total) by mouth daily.    clopidogrel 75 MG tablet Commonly known as: PLAVIX Take 1 tablet (75 mg total) by mouth daily. Start taking on: September 23, 2020    enalapril 10 MG tablet Commonly known as: VASOTEC TAKE 1 TABLET(10 MG) BY  MOUTH DAILY    fluticasone 50 MCG/ACT nasal spray Commonly known as: FLONASE Place 2 sprays into both nostrils daily.    glucose blood test strip Test blood glucose twice daily. Contour next strips. DX E11.56    Lantus SoloStar 100 UNIT/ML Solostar Pen Generic drug: insulin glargine Inject 20 Units into the skin at bedtime. Start tomorrow night Start taking on: September 23, 2020 What changed: when to take this additional instructions    levETIRAcetam 500 MG tablet Commonly known as: KEPPRA Take 1 tablet (500 mg total) by mouth 2 (two) times daily.    metFORMIN 750 MG 24 hr tablet Commonly known as: GLUCOPHAGE-XR Take 1 tablet (750 mg total) by mouth daily with breakfast. What changed: See the new instructions.    Pen Needles 30G X 8 MM Misc 1 Units by Does not apply route daily. Use to inject inulin daily    VITAMIN D PO Take 1 tablet by mouth daily.    Vitamin E 400 units Tabs Take 1 tablet (400 Units total) by mouth daily.             Follow-up Information       Micki Riley, MD. Schedule an appointment as soon as possible for a visit in 3 week(s).   Specialties: Neurology, Radiology Why: Please make appointment Contact information: [address] [place] [address] [phone]                             Allergies  Allergen Reactions   Penicillins Itching      Has patient had a PCN reaction causing immediate rash, facial/tongue/throat swelling, SOB or lightheadedness with hypotension: no Has patient had a PCN  reaction causing severe rash involving mucus membranes or skin necrosis: no Has patient had a PCN reaction that required hospitalization: no Has patient had a PCN reaction occurring within the last 10 years: yes If all of the above answers are "NO", then may proceed with Cephalosporin use.            Imaging Results  CT Head Wo Contrast   Result Date: 09/16/2020 CLINICAL DATA:  Trauma EXAM: CT HEAD WITHOUT CONTRAST CT CERVICAL SPINE WITHOUT CONTRAST TECHNIQUE: Multidetector CT imaging of the head and cervical spine was performed following the standard protocol without intravenous contrast. Multiplanar CT image reconstructions of the cervical spine were also generated. COMPARISON:  None. FINDINGS: CT HEAD FINDINGS Brain: No evidence of acute large vascular territory infarction, hemorrhage, hydrocephalus, extra-axial collection or mass lesion/mass effect. Bilateral basal ganglia mineralization, somewhat advanced in extent for age. No edema in this region to suggest superimposed acute hemorrhage. Vascular: No hyperdense vessel identified. Mild calcific atherosclerosis. Skull: No acute fracture. Sinuses/Orbits: Right frontal sinus osteoma. Mild ethmoid air cell mucosal thickening. Unremarkable orbits. Other: No mastoid effusions. CT CERVICAL SPINE FINDINGS Alignment: Straightening of the normal cervical lordosis. No substantial sagittal subluxation. Mild rotation of C1 on C2. Skull base and vertebrae: Vertebral body heights are maintained. Soft tissues and spinal canal: No prevertebral fluid or swelling. No visible canal hematoma. Disc levels: Moderate to severe degenerative disease at C5-C6 and C6-C7 with disc height loss, endplate sclerosis and posterior disc osteophyte complexes. At least moderate left foraminal stenosis at these levels (potentially severe on the left at C6-C7). Upper chest: Partially imaged dependent ground-glass opacity in the left upper lung, possibly atelectasis. IMPRESSION: CT head:  1. No evidence of acute intracranial abnormality. 2. Bilateral  basal ganglia mineralization, somewhat advanced in extent for age. Consider correlation with calcium/parathyroid labs. CT cervical spine: 1. No evidence of acute fracture. 2. Mild rotation of C1 on C2, most likely positional in the absence of a fixed torticollis. 3. Moderate to severe degenerative disc disease at C5-C6 and C6-C7 with at least mild canal and moderate left foraminal stenosis at these levels (potentially severe on the left at C6-C7). An MRI could better evaluate the canal, cord and foramina if clinically indicated. Electronically Signed   By: Feliberto Harts MD   On: 09/16/2020 19:45   CT Cervical Spine Wo Contrast   Result Date: 09/16/2020 CLINICAL DATA:  Trauma EXAM: CT HEAD WITHOUT CONTRAST CT CERVICAL SPINE WITHOUT CONTRAST TECHNIQUE: Multidetector CT imaging of the head and cervical spine was performed following the standard protocol without intravenous contrast. Multiplanar CT image reconstructions of the cervical spine were also generated. COMPARISON:  None. FINDINGS: CT HEAD FINDINGS Brain: No evidence of acute large vascular territory infarction, hemorrhage, hydrocephalus, extra-axial collection or mass lesion/mass effect. Bilateral basal ganglia mineralization, somewhat advanced in extent for age. No edema in this region to suggest superimposed acute hemorrhage. Vascular: No hyperdense vessel identified. Mild calcific atherosclerosis. Skull: No acute fracture. Sinuses/Orbits: Right frontal sinus osteoma. Mild ethmoid air cell mucosal thickening. Unremarkable orbits. Other: No mastoid effusions. CT CERVICAL SPINE FINDINGS Alignment: Straightening of the normal cervical lordosis. No substantial sagittal subluxation. Mild rotation of C1 on C2. Skull base and vertebrae: Vertebral body heights are maintained. Soft tissues and spinal canal: No prevertebral fluid or swelling. No visible canal hematoma. Disc levels: Moderate to  severe degenerative disease at C5-C6 and C6-C7 with disc height loss, endplate sclerosis and posterior disc osteophyte complexes. At least moderate left foraminal stenosis at these levels (potentially severe on the left at C6-C7). Upper chest: Partially imaged dependent ground-glass opacity in the left upper lung, possibly atelectasis. IMPRESSION: CT head: 1. No evidence of acute intracranial abnormality. 2. Bilateral basal ganglia mineralization, somewhat advanced in extent for age. Consider correlation with calcium/parathyroid labs. CT cervical spine: 1. No evidence of acute fracture. 2. Mild rotation of C1 on C2, most likely positional in the absence of a fixed torticollis. 3. Moderate to severe degenerative disc disease at C5-C6 and C6-C7 with at least mild canal and moderate left foraminal stenosis at these levels (potentially severe on the left at C6-C7). An MRI could better evaluate the canal, cord and foramina if clinically indicated. Electronically Signed   By: Feliberto Harts MD   On: 09/16/2020 19:45   MR ANGIO HEAD WO CONTRAST   Result Date: 09/19/2020 CLINICAL DATA:  Follow-up acute infarction left caudate and left parietal cortex EXAM: MRA HEAD WITHOUT CONTRAST MRA NECK WITHOUT CONTRAST TECHNIQUE: Angiographic images of the Circle of Willis were obtained using MRA technique without intravenous contrast. Angiographic images of the neck were obtained using MRA technique without intravenous contrast. Carotid stenosis measurements (when applicable) are obtained utilizing NASCET criteria, using the distal internal carotid diameter as the denominator. COMPARISON:  MRI yesterday. FINDINGS: MRA HEAD FINDINGS Both internal carotid arteries widely patent through the skull base and siphon regions. On the right, the anterior and middle cerebral arteries are patent and normal. On the left, there is moderate to severe stenosis at both the A1 and M1 segments, with considerable irregularity particularly of the M1  segment. The patient would be at risk of additional infarction in these vascular territories. Both vertebral arteries widely patent to the basilar. No basilar stenosis. Posterior circulation  branch vessels are normal. Left PCA takes fetal origin from the anterior circulation. Large posterior communicating artery on the right. MRA NECK FINDINGS Branching pattern from the arch is normal. Both common carotid arteries are widely patent to the bifurcation. Both carotid bifurcations are widely patent. There may be minimal nonstenotic plaque at the ICA bulb on the left. Both vertebral arteries widely patent from their origins to the basilar. IMPRESSION: No advanced finding in the neck. Minimal atherosclerotic plaque at the ICA bulb on the left but without stenosis. Intracranial MR angiography shows severe stenosis of the left A1 and M1 segments, with considerable irregularity of the M1 segment. Patient would be at risk of additional infarction in these vascular territories. Electronically Signed   By: Paulina Fusi M.D.   On: 09/19/2020 15:55   MR ANGIO NECK WO CONTRAST   Result Date: 09/19/2020 CLINICAL DATA:  Follow-up acute infarction left caudate and left parietal cortex EXAM: MRA HEAD WITHOUT CONTRAST MRA NECK WITHOUT CONTRAST TECHNIQUE: Angiographic images of the Circle of Willis were obtained using MRA technique without intravenous contrast. Angiographic images of the neck were obtained using MRA technique without intravenous contrast. Carotid stenosis measurements (when applicable) are obtained utilizing NASCET criteria, using the distal internal carotid diameter as the denominator. COMPARISON:  MRI yesterday. FINDINGS: MRA HEAD FINDINGS Both internal carotid arteries widely patent through the skull base and siphon regions. On the right, the anterior and middle cerebral arteries are patent and normal. On the left, there is moderate to severe stenosis at both the A1 and M1 segments, with considerable irregularity  particularly of the M1 segment. The patient would be at risk of additional infarction in these vascular territories. Both vertebral arteries widely patent to the basilar. No basilar stenosis. Posterior circulation branch vessels are normal. Left PCA takes fetal origin from the anterior circulation. Large posterior communicating artery on the right. MRA NECK FINDINGS Branching pattern from the arch is normal. Both common carotid arteries are widely patent to the bifurcation. Both carotid bifurcations are widely patent. There may be minimal nonstenotic plaque at the ICA bulb on the left. Both vertebral arteries widely patent from their origins to the basilar. IMPRESSION: No advanced finding in the neck. Minimal atherosclerotic plaque at the ICA bulb on the left but without stenosis. Intracranial MR angiography shows severe stenosis of the left A1 and M1 segments, with considerable irregularity of the M1 segment. Patient would be at risk of additional infarction in these vascular territories. Electronically Signed   By: Paulina Fusi M.D.   On: 09/19/2020 15:55   MR BRAIN WO CONTRAST   Result Date: 09/19/2020 CLINICAL DATA:  Initial evaluation for acute seizure, abnormal neuro exam. EXAM: MRI HEAD WITHOUT CONTRAST TECHNIQUE: Multiplanar, multiecho pulse sequences of the brain and surrounding structures were obtained without intravenous contrast. COMPARISON:  Prior CT from 09/16/2020. FINDINGS: Brain: Cerebral volume within normal limits for age. Minimal T2/FLAIR hyperintensity noted involving the periventricular white matter, nonspecific, but most likely related to chronic microvascular ischemic disease, extremely mild for age. 1.2 cm focus of diffusion abnormality seen involving the left caudate, consistent with an acute to early subacute ischemic infarct (series 5, image 83). No associated hemorrhage or mass effect. No other diffusion abnormality to suggest acute or subacute ischemia or changes related to seizure.  Gray-white matter differentiation otherwise maintained. No encephalomalacia to suggest chronic cortical infarction elsewhere within the brain. No other evidence for acute or chronic intracranial hemorrhage. No mass lesion, midline shift or mass effect. No hydrocephalus  or extra-axial fluid collection. Pituitary gland suprasellar region within normal limits. Midline structures intact. No intrinsic temporal lobe abnormality. Vascular: Major intracranial vascular flow voids are maintained. Skull and upper cervical spine: Craniocervical junction within normal limits. Bone marrow signal intensity within normal limits. Susceptibility artifact emanates from the left occipital scalp. Scalp soft tissues demonstrate no other acute finding. Sinuses/Orbits: Globes and orbital soft tissues demonstrate no acute finding. Paranasal sinuses are largely clear. Small to moderate bilateral mastoid effusions. Visualized nasopharynx within normal limits. Other: None. IMPRESSION: 1. 1.2 cm focus of diffusion abnormality involving the left caudate, consistent with an acute to early subacute ischemic infarct. No associated hemorrhage or mass effect. 2. No other acute intracranial abnormality. Electronically Signed   By: Rise Mu M.D.   On: 09/19/2020 00:58   DG Chest Port 1 View   Result Date: 09/18/2020 CLINICAL DATA:  Leukocytosis.  History of trauma. EXAM: PORTABLE CHEST 1 VIEW COMPARISON:  Chest x-ray 09/16/2020. FINDINGS: Right PICC line noted with its tip over the lower right atrium. Proximal repositioning of approximately 8 cm should be considered. Endotracheal tube and NG tube in stable position. Heart size stable. Persistent right base atelectasis/infiltrate. Tiny right pleural effusion cannot be excluded. No pneumothorax. A fracture of the posterolateral aspect of the left fifth rib cannot be excluded. IMPRESSION: 1. Right PICC line noted with its tip over the lower right atrium. Proximal repositioning of  approximately 8 cm should be considered. Endotracheal tube and NG tube in stable position. 2. Persistent right base atelectasis/infiltrate. Tiny right pleural effusion cannot be excluded. 3. A fracture of the posterolateral aspect of left rib cannot be excluded. No pneumothorax. Electronically Signed   By: Maisie Fus  Register   On: 09/18/2020 05:53   DG Chest Portable 1 View   Result Date: 09/16/2020 CLINICAL DATA:  Intubated EXAM: PORTABLE CHEST 1 VIEW COMPARISON:  Chest radiograph from earlier today. FINDINGS: Endotracheal tube tip is 4.1 cm above the carina. Enteric tube enters stomach with the tip not seen on this image. Stable cardiomediastinal silhouette with normal heart size. No pneumothorax. Stable small right pleural effusion. No left pleural effusion. Increased curvilinear right lung base atelectasis. Stable mild elevation of the right hemidiaphragm. No pulmonary edema. IMPRESSION: 1. Well-positioned endotracheal and enteric tubes. 2. Stable small right pleural effusion. 3. Increased curvilinear right lung base atelectasis. Electronically Signed   By: Delbert Phenix M.D.   On: 09/16/2020 19:04   DG Chest Port 1 View   Result Date: 09/16/2020 CLINICAL DATA:  Altered level of consciousness. EXAM: PORTABLE CHEST 1 VIEW COMPARISON:  None available. FINDINGS: Patient is rotated. Lung volumes are low. Upper normal heart size likely accentuated by technique. Elevation of right hemidiaphragm. Adjacent right basilar opacity. No pulmonary edema. No pneumothorax. No large pleural effusion. No acute osseous abnormalities are seen IMPRESSION: Rotated exam with low lung volumes. Elevation of right hemidiaphragm with adjacent right basilar opacity, probable atelectasis. Electronically Signed   By: Narda Rutherford M.D.   On: 09/16/2020 18:23   Overnight EEG with video   Result Date: 09/17/2020 Charlsie Quest, MD     09/18/2020  8:51 AM Patient Name: Tricia Bridges MRN: [id number] Epilepsy Attending: Charlsie Quest  Referring Physician/Provider: Dr Erick Blinks Duration: 09/17/2020 0053 to 09/18/2020 0053   Patient history: 55 y.o. female with PMH significant for diabetes, HTN who was found down and had several seizures with clustering. EEG to evaluate for seizure.   Level of alertness: asleep/sedated   AEDs during EEG study:  Propofol, Versed, LEV   Technical aspects: This EEG study was done with scalp electrodes positioned according to the 10-20 International system of electrode placement. Electrical activity was acquired at a sampling rate of 500Hz  and reviewed with a high frequency filter of 70Hz  and a low frequency filter of 1Hz . EEG data were recorded continuously and digitally stored.   Description: EEG initially showed burst suppression pattern with generalized, maximal frontal bursts of sharply contoured 15-18Hz  beta activity lasting 2-3 seconds alternating with 3-5 seconds of generalized eeg suppression. Gradually after sedation was weaned off, the duration of EEG suppression became shorter and eventually EEG showed continuous generalized 2 to 5 Hz theta-delta slowing. ABNORMALITY -Burst suppression, generalized -Continuous slow, generalized   IMPRESSION: This study was initially suggestive of profound diffuse encephalopathy, nonspecific etiology but likely related to sedation.   Gradually as sedation was weaned, EEG was suggestive of moderate to severe diffuse encephalopathy.  No seizures or epileptiform discharges were seen throughout the recording.        ECHOCARDIOGRAM COMPLETE   Result Date: 09/20/2020    ECHOCARDIOGRAM REPORT   Patient Name:   Tricia Bridges Date of Exam: 09/20/2020 Medical Rec #:  11/20/2020      Height:       63.0 in Accession #:    [place]     Weight:       136.2 lb Date of Birth:  1965-09-12       BSA:          1.643 m Patient Age:    54 years       BP:           144/92 mmHg Patient Gender: F              HR:           99 bpm. Exam Location:  Inpatient Procedure: 2D  Echo, Cardiac Doppler and Color Doppler Indications:    CVA  History:        Patient has no prior history of Echocardiogram examinations.                 Signs/Symptoms:Fever. Epilepsy.  Sonographer:    [id number] Referring Phys: [id number] PRIYANKA O YADAV IMPRESSIONS  1. Left ventricular ejection fraction, by estimation, is 60 to 65%. The left ventricle has normal function. The left ventricle has no regional wall motion abnormalities. There is mild concentric left ventricular hypertrophy of the septal and basal-septal segments. Left ventricular diastolic parameters are consistent with Grade I diastolic dysfunction (impaired relaxation).  2. Right ventricular systolic function is normal. The right ventricular size is normal. Tricuspid regurgitation signal is inadequate for assessing PA pressure.  3. The mitral valve is normal in structure. No evidence of mitral valve regurgitation. No evidence of mitral stenosis.  4. The aortic valve is tricuspid. Aortic valve regurgitation is not visualized. Mild to moderate aortic valve sclerosis/calcification is present, without any evidence of aortic stenosis.  5. The inferior vena cava is normal in size with greater than 50% respiratory variability, suggesting right atrial pressure of 3 mmHg. FINDINGS  Left Ventricle: Left ventricular ejection fraction, by estimation, is 60 to 65%. The left ventricle has normal function. The left ventricle has no regional wall motion abnormalities. The left ventricular internal cavity size was normal in size. There is  mild concentric left ventricular hypertrophy of the septal and basal-septal segments. Left ventricular diastolic parameters are consistent with Grade I diastolic dysfunction (impaired relaxation). Normal left ventricular  filling pressure. Right Ventricle: The right ventricular size is normal. No increase in right ventricular wall thickness. Right ventricular systolic function is normal. Tricuspid regurgitation signal is  inadequate for assessing PA pressure. Left Atrium: Left atrial size was normal in size. Right Atrium: Right atrial size was normal in size. Pericardium: There is no evidence of pericardial effusion. Mitral Valve: The mitral valve is normal in structure. No evidence of mitral valve regurgitation. No evidence of mitral valve stenosis. Tricuspid Valve: The tricuspid valve is normal in structure. Tricuspid valve regurgitation is not demonstrated. No evidence of tricuspid stenosis. Aortic Valve: The aortic valve is tricuspid. Aortic valve regurgitation is not visualized. Mild to moderate aortic valve sclerosis/calcification is present, without any evidence of aortic stenosis. Pulmonic Valve: The pulmonic valve was normal in structure. Pulmonic valve regurgitation is not visualized. No evidence of pulmonic stenosis. Aorta: The aortic root is normal in size and structure. Venous: The inferior vena cava is normal in size with greater than 50% respiratory variability, suggesting right atrial pressure of 3 mmHg. IAS/Shunts: No atrial level shunt detected by color flow Doppler.  LEFT VENTRICLE PLAX 2D LVIDd:         4.10 cm      Diastology LVIDs:         2.80 cm      LV e' medial:    5.22 cm/s LV PW:         1.20 cm      LV E/e' medial:  12.9 LV IVS:        1.30 cm      LV e' lateral:   6.31 cm/s LVOT diam:     2.10 cm      LV E/e' lateral: 10.7 LV SV:         61 LV SV Index:   37 LVOT Area:     3.46 cm  LV Volumes (MOD) LV vol d, MOD A4C: 104.0 ml LV vol s, MOD A4C: 27.9 ml LV SV MOD A4C:     104.0 ml RIGHT VENTRICLE RV Basal diam:  2.80 cm RV S prime:     10.40 cm/s TAPSE (M-mode): 1.6 cm LEFT ATRIUM             Index       RIGHT ATRIUM          Index LA diam:        3.20 cm 1.95 cm/m  RA Area:     8.29 cm LA Vol (A2C):   29.5 ml 17.96 ml/m RA Volume:   14.70 ml 8.95 ml/m LA Vol (A4C):   39.8 ml 24.23 ml/m LA Biplane Vol: 34.5 ml 21.00 ml/m  AORTIC VALVE LVOT Vmax:   99.00 cm/s LVOT Vmean:  68.000 cm/s LVOT VTI:     0.175 m  AORTA Ao Root diam: 2.50 cm MITRAL VALVE MV Area (PHT): 3.85 cm     SHUNTS MV Decel Time: 197 msec     Systemic VTI:  0.18 m MV E velocity: 67.40 cm/s   Systemic Diam: 2.10 cm MV A velocity: 111.00 cm/s MV E/A ratio:  0.61 Armanda Magic MD Electronically signed by Armanda Magic MD Signature Date/Time: 09/20/2020/1:51:44 PM    Final     [place] EKG SITE RITE   Result Date: 09/17/2020 If Site Rite image not attached, placement could not be confirmed due to current cardiac rhythm.      The results of significant diagnostics from this hospitalization (including imaging, microbiology, ancillary and laboratory)  are listed below for reference.      Microbiology:        Recent Results (from the past 240 hour(s))  Culture, blood (routine x 2)     Status: None    Collection Time: 09/16/20  5:48 PM    Specimen: BLOOD  Result Value Ref Range Status    Specimen Description BLOOD BLOOD RIGHT FOREARM   Final    Special Requests     Final      BOTTLES DRAWN AEROBIC AND ANAEROBIC Blood Culture results may not be optimal due to an inadequate volume of blood received in culture bottles    Culture     Final      NO GROWTH 5 DAYS Performed at [facility], [address] [place], [place] [address]      Report Status 09/21/2020 FINAL   Final  SARS CORONAVIRUS 2 (TAT 6-24 HRS) Nasopharyngeal Nasopharyngeal Swab     Status: None    Collection Time: 09/16/20  6:39 PM    Specimen: Nasopharyngeal Swab  Result Value Ref Range Status    SARS Coronavirus 2 NEGATIVE NEGATIVE Final      Comment: (NOTE) SARS-CoV-2 target nucleic acids are NOT DETECTED.   The SARS-CoV-2 RNA is generally detectable in upper and lower respiratory specimens during the acute phase of infection. Negative results do not preclude SARS-CoV-2 infection, do not rule out co-infections with other pathogens, and should not be used as the sole basis for treatment or other patient management decisions. Negative results must be combined  with clinical observations, patient history, and epidemiological information. The expected result is Negative.   Fact Sheet for Patients: HairSlick.nohttps://www.fda.gov/media/138098/download   Fact Sheet for Healthcare Providers: quierodirigir.comhttps://www.fda.gov/media/138095/download   This test is not yet approved or cleared by the [place] FDA and has been authorized for detection and/or diagnosis of SARS-CoV-2 by FDA under an Emergency Use Authorization (EUA). This EUA will remain in effect (meaning this test can be used) for the duration of the COVID-19 declaration under Se ction 564(b)(1) of the Act, 21 U.S.C. section 360bbb-3(b)(1), unless the authorization is terminated or revoked sooner.   Performed at [facility], [address] [place], [place] [address]    Culture, blood (routine x 2)     Status: None    Collection Time: 09/16/20  9:20 PM    Specimen: BLOOD  Result Value Ref Range Status    Specimen Description BLOOD SITE NOT SPECIFIED   Final    Special Requests     Final      BOTTLES DRAWN AEROBIC AND ANAEROBIC Blood Culture results may not be optimal due to an inadequate volume of blood received in culture bottles    Culture     Final      NO GROWTH 5 DAYS Performed at [facility], [address] [place], [place] [address]      Report Status 09/21/2020 FINAL   Final  MRSA PCR Screening     Status: None    Collection Time: 09/16/20 10:28 PM    Specimen: Nasal Mucosa; Nasopharyngeal  Result Value Ref Range Status    MRSA by PCR NEGATIVE NEGATIVE Final      Comment:        The GeneXpert MRSA Assay (FDA approved for NASAL specimens only), is one component of a comprehensive MRSA colonization surveillance program. It is not intended to diagnose MRSA infection nor to guide or monitor treatment for MRSA infections. Performed at  [facility], [address] [place], [place], [place] [address]        Labs: BNP (last 3 results) Recent Labs (within last 365 days)   No results for input(s): BNP in the last 8760 hours.   Basic Metabolic Panel: Last Labs              Recent Labs  Lab 09/17/20 0336 09/17/20 0416 09/18/20 0546 09/18/20 0935 09/18/20 1635 09/19/20 0322 09/20/20 0500 09/21/20 0339 09/22/20 0039  NA 145   < > 146* 143 141 138 138 138 140  K 3.9   < > 4.1 3.3* 3.2* 3.4* 5.1 3.9 3.5  CL 109   < > 127* 109  -- 99 106 104 106  CO2 22   < > 15* 23  -- GLUCOSE 195*   < > 77 143*  -- 135* 82 129* 105*  BUN 13   < > 5* 6  -- 5* CREATININE 0.91   < > 0.44 0.68  -- 0.71 0.56 0.71 0.74  CALCIUM 9.5   < > 5.4* 8.8*  -- 9.0 8.2* 8.9 8.8*  MG 2.3  -- 1.9 1.8  -- 1.8 1.7  --  --  PHOS 2.3*  -- 3.2 3.8  -- 4.5 3.1  --  --   < > = values in this interval not displayed.      Liver Function Tests: Last Labs      Recent Labs  Lab 09/16/20 1748  AST 32  ALT 29  ALKPHOS 135*  BILITOT 0.5  PROT 7.4  ALBUMIN 3.7      Last Labs   No results for input(s): LIPASE, AMYLASE in the last 168 hours.   Last Labs      Recent Labs  Lab 09/16/20 2336  AMMONIA 27      CBC: Last Labs              Recent Labs  Lab 09/16/20 1748 09/16/20 1809 09/17/20 0336 09/17/20 0416 09/17/20 1719 09/18/20 0546 09/18/20 1635 09/19/20 0322 09/20/20 0500  WBC 20.0*  -- 18.9*  --  -- 14.8*  -- 12.7* 7.6  NEUTROABS 18.2*  --  --  --  --  --  --  --  --  HGB 15.5*   < > 16.0*   < > 13.3 13.5 14.3 14.6 14.3  HCT 49.1*   < > 47.1*   < > 39.0 40.5 42.0 44.4 43.1  MCV 90.9  -- 86.1  --  -- 87.9  -- 88.4 88.3  PLT 246  -- 204  --  -- 165  -- 172 171   < > = values in this interval not displayed.      Cardiac Enzymes: Last Labs          Recent Labs  Lab 09/18/20 0546 09/19/20 1505 09/20/20 0500 09/21/20 0339 09/22/20 0823  CKTOTAL 6,709* 15,801* 10,234* 9,695* 3,074*      BNP: Last Labs   Invalid input(s): POCBNP   CBG: Last Labs          Recent Labs  Lab 09/21/20 1634 09/21/20 2055 09/22/20 0750  09/22/20 0827 09/22/20 1151  GLUCAP 148* 131* 58* 120* 225*      D-Dimer Recent Labs (last 2 labs)   No results for input(s): DDIMER in the last 72 hours.   Hgb A1c Recent Labs (last 2 labs)   No results for input(s): HGBA1C in the  last 72 hours.   Lipid Profile Recent Labs (last 2 labs)   No results for input(s): CHOL, HDL, LDLCALC, TRIG, CHOLHDL, LDLDIRECT in the last 72 hours.   Thyroid function studies  Recent Labs (last 2 labs)   No results for input(s): TSH, T4TOTAL, T3FREE, THYROIDAB in the last 72 hours.   Invalid input(s): FREET3   Anemia work up [employer] (last 2 labs)   No results for input(s): VITAMINB12, FOLATE, FERRITIN, TIBC, IRON, RETICCTPCT in the last 72 hours.   Urinalysis Labs (Brief)          Component Value Date/Time    COLORURINE STRAW (A) 06/15/2016 1711    APPEARANCEUR CLEAR 06/15/2016 1711    LABSPEC 1.026 06/15/2016 1711    PHURINE 5.0 06/15/2016 1711    GLUCOSEU >=500 (A) 06/15/2016 1711    HGBUR NEGATIVE 06/15/2016 1711    BILIRUBINUR NEGATIVE 06/15/2016 1711    KETONESUR 5 (A) 06/15/2016 1711    PROTEINUR NEGATIVE 06/15/2016 1711    NITRITE NEGATIVE 06/15/2016 1711    LEUKOCYTESUR NEGATIVE 06/15/2016 1711      Sepsis Labs Last Labs   Invalid input(s): PROCALCITONIN,  WBC,  LACTICIDVEN   Microbiology        Recent Results (from the past 240 hour(s))  Culture, blood (routine x 2)     Status: None    Collection Time: 09/16/20  5:48 PM    Specimen: BLOOD  Result Value Ref Range Status    Specimen Description BLOOD BLOOD RIGHT FOREARM   Final    Special Requests     Final      BOTTLES DRAWN AEROBIC AND ANAEROBIC Blood Culture results may not be optimal due to an inadequate volume of blood received in culture bottles    Culture     Final      NO GROWTH 5 DAYS Performed at [facility], [address], [place], [place] [address]      Report Status 09/21/2020 FINAL   Final  SARS CORONAVIRUS 2 (TAT 6-24 HRS) Nasopharyngeal  Nasopharyngeal Swab     Status: None    Collection Time: 09/16/20  6:39 PM    Specimen: Nasopharyngeal Swab  Result Value Ref Range Status    SARS Coronavirus 2 NEGATIVE NEGATIVE Final      Comment: (NOTE) SARS-CoV-2 target nucleic acids are NOT DETECTED.   The SARS-CoV-2 RNA is generally detectable in upper and lower respiratory specimens during the acute phase of infection. Negative results do not preclude SARS-CoV-2 infection, do not rule out co-infections with other pathogens, and should not be used as the sole basis for treatment or other patient management decisions. Negative results must be combined with clinical observations, patient history, and epidemiological information. The expected result is Negative.   Fact Sheet for Patients: HairSlick.no   Fact Sheet for Healthcare Providers: quierodirigir.com   This test is not yet approved or cleared by the [place] FDA and has been authorized for detection and/or diagnosis of SARS-CoV-2 by FDA under an Emergency Use Authorization (EUA). This EUA will remain in effect (meaning this test can be used) for the duration of the COVID-19 declaration under Se ction 564(b)(1) of the Act, 21 U.S.C. section 360bbb-3(b)(1), unless the authorization is terminated or revoked sooner.   Performed at [facility], [address], [place], [place] [address]    Culture, blood (routine x 2)     Status: None    Collection Time: 09/16/20  9:20 PM  Specimen: BLOOD  Result Value Ref Range Status    Specimen Description BLOOD SITE NOT SPECIFIED   Final    Special Requests     Final      BOTTLES DRAWN AEROBIC AND ANAEROBIC Blood Culture results may not be optimal due to an inadequate volume of blood received in culture bottles    Culture     Final      NO GROWTH 5 DAYS Performed at [facility], [address], [place], [place] [address]      Report Status 09/21/2020 FINAL    Final  MRSA PCR Screening     Status: None    Collection Time: 09/16/20 10:28 PM    Specimen: Nasal Mucosa; Nasopharyngeal  Result Value Ref Range Status    MRSA by PCR NEGATIVE NEGATIVE Final      Comment:        The GeneXpert MRSA Assay (FDA approved for NASAL specimens only), is one component of a comprehensive MRSA colonization surveillance program. It is not intended to diagnose MRSA infection nor to guide or monitor treatment for MRSA infections. Performed at [facility], [address] [place], [place] [address]          Time coordinating discharge in minutes: 65   SIGNED:     Calvert Cantor, MD      [facility] 09/22/2020, 1:59 PM             Note Details  Author Calvert Cantor, MD File Time 09/22/2020  2:12 PM  Author Type Physician Status Signed  Last Editor Calvert Cantor, MD Service [facility] Acct # [id number] Admit Date 09/16/2020

## 2020-09-30 ENCOUNTER — Encounter: Payer: Self-pay | Admitting: Neurology

## 2020-09-30 ENCOUNTER — Ambulatory Visit: Payer: Self-pay | Admitting: Neurology

## 2020-09-30 VITALS — BP 156/96 | HR 87 | Ht 64.0 in | Wt 143.6 lb

## 2020-09-30 DIAGNOSIS — G40901 Epilepsy, unspecified, not intractable, with status epilepticus: Secondary | ICD-10-CM

## 2020-09-30 DIAGNOSIS — I639 Cerebral infarction, unspecified: Secondary | ICD-10-CM

## 2020-09-30 DIAGNOSIS — I63512 Cerebral infarction due to unspecified occlusion or stenosis of left middle cerebral artery: Secondary | ICD-10-CM

## 2020-09-30 HISTORY — DX: Cerebral infarction, unspecified: I63.9

## 2020-09-30 MED ORDER — LEVETIRACETAM 500 MG PO TABS: 500.0000 mg | ORAL_TABLET | Freq: Two times a day (BID) | ORAL | 1 refills | Status: DC

## 2020-09-30 NOTE — Progress Notes (Signed)
Reason for visit: Stroke, Seizures  Referring physician: [facility]  Tricia Bridges is a 55 y.o. female  History of present illness:  Tricia Bridges is a 55 year old right handed black female with a history of poorly controlled diabetes and a history of hypertension.  She was admitted to the hospital on 16 September 2020 with onset of seizures, status epilepticus.  Admission blood work revealed a glucose level of 623, and a white blood count of 20.  The patient was febrile.  A urine drug screen was positive only for benzodiazepines.  The lactic acid level was 5.7.  The troponin I level was 49.  The hemoglobin A1c was greater than 15.5.  CK enzyme levels peaked at 15,800.  The patient has been noted to have a seizure at home prior to coming into the hospital and EMS witnessed 2 other seizures.  Propofol was used following intubation for seizure control.  The patient eventually was switched over to Keppra.  The patient was started on insulin infusion.  MRI of the brain showed evidence of a left caudate focus consistent with an acute to early subacute ischemic infarct, MRA of the head and neck revealed evidence of severe stenosis of the left A1 and M1 segments.  The patient was treated with aspirin and Plavix combination.  After hospitalization, the patient has done fairly well, she believes that her mentation has returned to near normal.  She is not having any headaches, dizziness, or any residual numbness or weakness of extremities.  She denies any gait disturbance.  She did have some blurred vision initially, but this has improved.  She has not had any recurrent seizures.  She is on aspirin and Plavix combination, she is to stop the Plavix after 3 weeks.  She is tolerating the Keppra well.  She no longer has any muscle stiffness or any muscle weakness.  She indicates that she does not smoke cigarettes or drink alcohol.  The patient comes in with her daughter today, the daughter witnessed the first seizure at  home, the seizure was a generalized event lasting a minute or so associated with urinary incontinence and tongue biting.  The patient denies any prior history of seizures, she denies any history of head trauma or any family history of seizures.  The patient was not taking care of her diabetes prior to admission, she was drinking regular soft drinks and she was on only 3 units of insulin daily, she was to be taking 20 units a day.  Past Medical History:  Diagnosis Date   Diabetes mellitus without complication (HCC)    Hypertension    Pre-diabetes    Seizures (HCC)    Stroke (cerebrum) (HCC) 09/30/2020    Past Surgical History:  Procedure Laterality Date   CESAREAN SECTION      Family History  Problem Relation Age of Onset   Hypertension Mother    COPD Mother        Smoker   Alcohol abuse Father    Hypertension Sister     Social history:  reports that she has never smoked. She has never used smokeless tobacco. She reports that she does not drink alcohol and does not use drugs.  Medications:  Prior to Admission medications   Medication Sig Start Date End Date Taking? Authorizing Provider  amLODipine (NORVASC) 5 MG tablet TAKE 1 TABLET(5 MG) BY MOUTH DAILY 09/22/20   Calvert Cantor, MD  aspirin EC 81 MG tablet Take 1 tablet (81 mg total) by mouth  daily. 09/22/20   Calvert Cantor, MD  atorvastatin (LIPITOR) 40 MG tablet Take 1 tablet (40 mg total) by mouth daily. 09/22/20   Calvert Cantor, MD  Cholecalciferol (VITAMIN D PO) Take 1 tablet by mouth daily. Patient not taking: No sig reported    [provider]  clopidogrel (PLAVIX) 75 MG tablet Take 1 tablet (75 mg total) by mouth daily. 09/23/20   Calvert Cantor, MD  enalapril (VASOTEC) 10 MG tablet TAKE 1 TABLET(10 MG) BY MOUTH DAILY 09/22/20   Calvert Cantor, MD  fluticasone (FLONASE) 50 MCG/ACT nasal spray Place 2 sprays into both nostrils daily. 09/22/20   Calvert Cantor, MD  glucose blood test strip Test blood glucose twice daily.  Contour next strips. DX E11.56 09/24/17   Everrett Coombe, DO  insulin glargine (LANTUS SOLOSTAR) 100 UNIT/ML Solostar Pen Inject 20 Units into the skin at bedtime. Start tomorrow night 09/23/20   Calvert Cantor, MD  Insulin Pen Needle (PEN NEEDLES) 30G X 8 MM MISC 1 Units by Does not apply route daily. Use to inject inulin daily 08/13/17   Everrett Coombe, DO  levETIRAcetam (KEPPRA) 500 MG tablet Take 1 tablet (500 mg total) by mouth 2 (two) times daily. 09/22/20   Calvert Cantor, MD  metFORMIN (GLUCOPHAGE-XR) 750 MG 24 hr tablet Take 1 tablet (750 mg total) by mouth daily with breakfast. 09/22/20   Calvert Cantor, MD  Vitamin E 400 units TABS Take 1 tablet (400 Units total) by mouth daily. 11/25/17   Everrett Coombe, DO      Allergies  Allergen Reactions   Penicillins Itching    Has patient had a PCN reaction causing immediate rash, facial/tongue/throat swelling, SOB or lightheadedness with hypotension: no Has patient had a PCN reaction causing severe rash involving mucus membranes or skin necrosis: no Has patient had a PCN reaction that required hospitalization: no Has patient had a PCN reaction occurring within the last 10 years: yes If all of the above answers are "NO", then may proceed with Cephalosporin use.     ROS:  Out of a complete 14 system review of symptoms, the patient complains only of the following symptoms, and all other reviewed systems are negative.  Seizure Muscle soreness  Blood pressure (!) 156/96, pulse 87, height 5\' 4"  (1.626 m), weight 143 lb 9.6 oz (65.1 kg).  Physical Exam  General: The patient is alert and cooperative at the time of the examination.  Eyes: Pupils are equal, round, and reactive to light. Discs are flat bilaterally.  Neck: The neck is supple, no carotid bruits are noted.  Respiratory: The respiratory examination is clear.  Cardiovascular: The cardiovascular examination reveals a regular rate and rhythm, no obvious murmurs or rubs are  noted.  Skin: Extremities are without significant edema.  Neurologic Exam  Mental status: The patient is alert and oriented x 3 at the time of the examination. The patient has apparent normal recent and remote memory, with an apparently normal attention span and concentration ability.  Cranial nerves: Facial symmetry is present. There is good sensation of the face to pinprick and soft touch bilaterally. The strength of the facial muscles and the muscles to head turning and shoulder shrug are normal bilaterally. Speech is well enunciated, no aphasia or dysarthria is noted. Extraocular movements are full. Visual fields are full. The tongue is midline, and the patient has symmetric elevation of the soft palate. No obvious hearing deficits are noted.  Motor: The motor testing reveals 5 over 5 strength of all 4  extremities. Good symmetric motor tone is noted throughout.  Sensory: Sensory testing is intact to pinprick, soft touch, vibration sensation, and position sense on all 4 extremities. No evidence of extinction is noted.  Coordination: Cerebellar testing reveals good finger-nose-finger and heel-to-shin bilaterally.  Gait and station: Gait is normal. Tandem gait is normal. Romberg is negative. No drift is seen.  Reflexes: Deep tendon reflexes are symmetric, but are slightly depressed bilaterally. Toes are downgoing bilaterally.   MRI brain 09/18/20:  IMPRESSION: 1. 1.2 cm focus of diffusion abnormality involving the left caudate, consistent with an acute to early subacute ischemic infarct. No associated hemorrhage or mass effect. 2. No other acute intracranial abnormality.   * MRI scan images were reviewed online. I agree with the written report.   EEG 09/17/20:  ABNORMALITY -Burst suppression, generalized -Continuous slow, generalized    IMPRESSION: This study was initially suggestive of profound diffuse encephalopathy, nonspecific etiology but likely related to sedation.    Gradually as sedation was weaned, EEG was suggestive of moderate to severe diffuse encephalopathy.  No seizures or epileptiform discharges were seen throughout the recording.   2D echo 09/20/20:  IMPRESSIONS     1. Left ventricular ejection fraction, by estimation, is 60 to 65%. The  left ventricle has normal function. The left ventricle has no regional  wall motion abnormalities. There is mild concentric left ventricular  hypertrophy of the septal and  basal-septal segments. Left ventricular diastolic parameters are  consistent with Grade I diastolic dysfunction (impaired relaxation).   2. Right ventricular systolic function is normal. The right ventricular  size is normal. Tricuspid regurgitation signal is inadequate for assessing  PA pressure.   3. The mitral valve is normal in structure. No evidence of mitral valve  regurgitation. No evidence of mitral stenosis.   4. The aortic valve is tricuspid. Aortic valve regurgitation is not  visualized. Mild to moderate aortic valve sclerosis/calcification is  present, without any evidence of aortic stenosis.   5. The inferior vena cava is normal in size with greater than 50%  respiratory variability, suggesting right atrial pressure of 3 mmHg.    MRA head and neck 09/19/20:  IMPRESSION: No advanced finding in the neck. Minimal atherosclerotic plaque at the ICA bulb on the left but without stenosis.   Intracranial MR angiography shows severe stenosis of the left A1 and M1 segments, with considerable irregularity of the M1 segment. Patient would be at risk of additional infarction in these vascular territories.    Assessment/Plan:  1.  History of diabetes, poorly controlled  2.  Status epilepticus  3.  Cerebrovascular disease, left caudate infarct  The patient likely has had an issue with symptomatic seizures from a combination of severely elevated blood sugars and the acute stroke event.  The patient will convert to aspirin  alone, she will remain on Keppra for least 6 months, we may try to taper her off the medication if she does well at that point.  The patient wants to go back to work, I have written a note for her to return to work on 27 June.  Her work does not entail climbing to heights or operating heavy equipment.  The patient is not to drive a car for least 6 months.  A prescription for the Keppra was given.  Marlan Palau MD 09/30/2020 8:34 AM  [facility] [address] [place], [place] [address]  Phone [phone] Fax [phone]

## 2020-10-01 ENCOUNTER — Other Ambulatory Visit: Payer: Self-pay

## 2020-10-01 ENCOUNTER — Ambulatory Visit (INDEPENDENT_AMBULATORY_CARE_PROVIDER_SITE_OTHER): Payer: Self-pay | Admitting: Nurse Practitioner

## 2020-10-01 VITALS — BP 140/93 | HR 84 | Temp 97.9°F | Resp 18

## 2020-10-01 DIAGNOSIS — I63512 Cerebral infarction due to unspecified occlusion or stenosis of left middle cerebral artery: Secondary | ICD-10-CM

## 2020-10-01 DIAGNOSIS — I10 Essential (primary) hypertension: Secondary | ICD-10-CM

## 2020-10-01 DIAGNOSIS — G40901 Epilepsy, unspecified, not intractable, with status epilepticus: Secondary | ICD-10-CM

## 2020-10-01 DIAGNOSIS — Z794 Long term (current) use of insulin: Secondary | ICD-10-CM

## 2020-10-01 DIAGNOSIS — E119 Type 2 diabetes mellitus without complications: Secondary | ICD-10-CM

## 2020-10-01 LAB — VITAMIN B1: Vitamin B1 (Thiamine): 165 nmol/L (ref 66.5–200.0)

## 2020-10-01 MED ORDER — "PEN NEEDLES 5/16"" 31G X 8 MM MISC"
1.0000 [IU] | Freq: Every day | 2 refills | Status: DC
  Filled 2020-10-01: qty 100, 25d supply, fill #0

## 2020-10-01 MED ORDER — LANTUS SOLOSTAR 100 UNIT/ML ~~LOC~~ SOPN
20.0000 [IU] | PEN_INJECTOR | Freq: Every day | SUBCUTANEOUS | 0 refills | Status: DC
  Filled 2020-10-01: qty 3, 15d supply, fill #0
  Filled 2020-10-01: qty 1, 5d supply, fill #0

## 2020-10-01 NOTE — Patient Instructions (Signed)
Status epilepticus /acute/subacute CVA  Continue to follow with neurology  Continue Keppra  Continue ASA  Continue PT   Essential hypertension  -Continue amlodipine and Enalapril- Stop HCTZ   Type 2 diabetes mellitus , uncontrolled with hyperglycemia with long-term current use of insulin  -HBA1c > 15.5  -Continue on Lantus and sliding scale insulin in the hospital   Follow up:  Follow up to establish care with PCP

## 2020-10-01 NOTE — Assessment & Plan Note (Signed)
Status epilepticus /acute/subacute CVA  Continue to follow with neurology  Continue Keppra  Continue ASA  Continue PT   Essential hypertension  -Continue amlodipine and Enalapril- Stop HCTZ   Type 2 diabetes mellitus , uncontrolled with hyperglycemia with long-term current use of insulin  -HBA1c > 15.5  -Continue on Lantus and sliding scale insulin in the hospital   Follow up:  Follow up to establish care with PCP       

## 2020-10-01 NOTE — Progress Notes (Signed)
@Patient  ID: , female    DOB: Sep 09, 1965, 55 y.o.   MRN: [id number]  Chief Complaint  Patient presents with   Hospitalization Follow-up    Referring provider: No ref. provider found  HPI   Patient presents today for a transition of care visit.  Patient was admitted to the hospital on 09/16/2020 and discharged on 09/22/2020 for uncontrolled diabetes in seizures.  Patient was found to have subacute CVA.  Patient did follow-up with neurology yesterday.  She has been continued on Keppra and aspirin.  Patient was able to get all of her medications at hospital discharge.  She does need a refill on Lantus today.  Patient does not currently have a PCP and we have scheduled her a visit to establish care with a new PCP in August.  We discussed that we can refill her medications if needed in between her visit today and the visit to establish care with a new PCP.  Patient is currently taking Lantus and Glucophage for diabetes.  Patient has been followed by outpatient physical therapy.  Overall patient has been doing well since hospital discharge.  She does have family support with her in office today.  She has been trying to do better following a diabetic diet.  She has been compliant with her medications.  She is keeping track of her blood pressure and blood sugars at home.  Her blood sugar this morning was 141.  Patient will return to work next week. Denies f/c/s, n/v/d, hemoptysis, PND, chest pain or edema.     Allergies  Allergen Reactions   Penicillins Itching    Has patient had a PCN reaction causing immediate rash, facial/tongue/throat swelling, SOB or lightheadedness with hypotension: no Has patient had a PCN reaction causing severe rash involving mucus membranes or skin necrosis: no Has patient had a PCN reaction that required hospitalization: no Has patient had a PCN reaction occurring within the last 10 years: yes If all of the above answers are "NO", then may proceed with  Cephalosporin use.     Immunization History  Administered Date(s) Administered   Influenza,inj,Quad PF,6+ Mos 01/29/2016, 02/25/2018   Pneumococcal Polysaccharide-23 08/13/2017   Tdap 08/13/2017    Past Medical History:  Diagnosis Date   Diabetes mellitus without complication (HCC)    Hypertension    Pre-diabetes    Seizures (HCC)    Stroke (cerebrum) (HCC) 09/30/2020    Tobacco History: Social History   Tobacco Use  Smoking Status Never  Smokeless Tobacco Never   Counseling given: Not Answered   Outpatient Encounter Medications as of 10/01/2020  Medication Sig   amLODipine (NORVASC) 5 MG tablet TAKE 1 TABLET(5 MG) BY MOUTH DAILY   aspirin EC 81 MG tablet Take 1 tablet (81 mg total) by mouth daily.   atorvastatin (LIPITOR) 40 MG tablet Take 1 tablet (40 mg total) by mouth daily.   Cholecalciferol (VITAMIN D PO) Take 1 tablet by mouth daily.   clopidogrel (PLAVIX) 75 MG tablet Take 1 tablet (75 mg total) by mouth daily.   enalapril (VASOTEC) 10 MG tablet TAKE 1 TABLET(10 MG) BY MOUTH DAILY   fluticasone (FLONASE) 50 MCG/ACT nasal spray Place 2 sprays into both nostrils daily.   glucose blood test strip Test blood glucose twice daily. Contour next strips. DX E11.56   insulin glargine (LANTUS SOLOSTAR) 100 UNIT/ML Solostar Pen Inject 20 Units into the skin at bedtime. Start tomorrow night   Insulin Pen Needle (PEN NEEDLES 31GX5/16") 31G X 8 MM MISC  1 Units by Does not apply route daily.   levETIRAcetam (KEPPRA) 500 MG tablet Take 1 tablet (500 mg total) by mouth 2 (two) times daily.   metFORMIN (GLUCOPHAGE-XR) 750 MG 24 hr tablet Take 1 tablet (750 mg total) by mouth daily with breakfast.   Vitamin E 400 units TABS Take 1 tablet (400 Units total) by mouth daily.   [DISCONTINUED] insulin glargine (LANTUS SOLOSTAR) 100 UNIT/ML Solostar Pen Inject 20 Units into the skin at bedtime. Start tomorrow night   [DISCONTINUED] Insulin Pen Needle (PEN NEEDLES) 30G X 8 MM MISC 1 Units by  Does not apply route daily. Use to inject inulin daily   No facility-administered encounter medications on file as of 10/01/2020.     Review of Systems  Review of Systems  Constitutional: Negative.  Negative for fatigue and fever.  HENT: Negative.    Respiratory:  Negative for cough and shortness of breath.   Cardiovascular: Negative.   Gastrointestinal: Negative.   Allergic/Immunologic: Negative.   Neurological: Negative.   Psychiatric/Behavioral: Negative.        Physical Exam  BP (!) 140/93   Pulse 84   Temp 97.9 F (36.6 C)   Resp 18   SpO2 100%   Wt Readings from Last 5 Encounters:  09/30/20 143 lb 9.6 oz (65.1 kg)  09/19/20 136 lb 3.9 oz (61.8 kg)  02/25/18 160 lb 3.2 oz (72.7 kg)  11/25/17 155 lb (70.3 kg)  09/24/17 159 lb 3.2 oz (72.2 kg)     Physical Exam Vitals and nursing note reviewed.  Constitutional:      General: She is not in acute distress.    Appearance: She is well-developed.  Cardiovascular:     Rate and Rhythm: Normal rate and regular rhythm.  Pulmonary:     Effort: Pulmonary effort is normal.     Breath sounds: Normal breath sounds.  Neurological:     Mental Status: She is alert and oriented to person, place, and time.     Motor: No weakness.     Gait: Gait normal.  Psychiatric:        Mood and Affect: Mood normal.        Behavior: Behavior normal.        Thought Content: Thought content normal.        Judgment: Judgment normal.     Lab Results:  CBC    Component Value Date/Time   WBC 7.6 09/20/2020 0500   RBC 4.88 09/20/2020 0500   HGB 14.3 09/20/2020 0500   HCT 43.1 09/20/2020 0500   PLT 171 09/20/2020 0500   MCV 88.3 09/20/2020 0500   MCH 29.3 09/20/2020 0500   MCHC 33.2 09/20/2020 0500   RDW 12.3 09/20/2020 0500   LYMPHSABS 0.7 09/16/2020 1748   MONOABS 0.9 09/16/2020 1748   EOSABS 0.0 09/16/2020 1748   BASOSABS 0.1 09/16/2020 1748    BMET    Component Value Date/Time   NA 140 09/22/2020 0039   K 3.5  09/22/2020 0039   CL 106 09/22/2020 0039   CO2 26 09/22/2020 0039   GLUCOSE 105 (H) 09/22/2020 0039   BUN 10 09/22/2020 0039   CREATININE 0.74 09/22/2020 0039   CALCIUM 8.8 (L) 09/22/2020 0039   GFRNONAA >60 09/22/2020 0039   GFRAA 49 (L) 06/15/2016 1727    BNP No results found for: BNP  ProBNP No results found for: PROBNP  Imaging: CT Head Wo Contrast  Result Date: 09/16/2020 CLINICAL DATA:  Trauma EXAM: CT HEAD WITHOUT  CONTRAST CT CERVICAL SPINE WITHOUT CONTRAST TECHNIQUE: Multidetector CT imaging of the head and cervical spine was performed following the standard protocol without intravenous contrast. Multiplanar CT image reconstructions of the cervical spine were also generated. COMPARISON:  None. FINDINGS: CT HEAD FINDINGS Brain: No evidence of acute large vascular territory infarction, hemorrhage, hydrocephalus, extra-axial collection or mass lesion/mass effect. Bilateral basal ganglia mineralization, somewhat advanced in extent for age. No edema in this region to suggest superimposed acute hemorrhage. Vascular: No hyperdense vessel identified. Mild calcific atherosclerosis. Skull: No acute fracture. Sinuses/Orbits: Right frontal sinus osteoma. Mild ethmoid air cell mucosal thickening. Unremarkable orbits. Other: No mastoid effusions. CT CERVICAL SPINE FINDINGS Alignment: Straightening of the normal cervical lordosis. No substantial sagittal subluxation. Mild rotation of C1 on C2. Skull base and vertebrae: Vertebral body heights are maintained. Soft tissues and spinal canal: No prevertebral fluid or swelling. No visible canal hematoma. Disc levels: Moderate to severe degenerative disease at C5-C6 and C6-C7 with disc height loss, endplate sclerosis and posterior disc osteophyte complexes. At least moderate left foraminal stenosis at these levels (potentially severe on the left at C6-C7). Upper chest: Partially imaged dependent ground-glass opacity in the left upper lung, possibly atelectasis.  IMPRESSION: CT head: 1. No evidence of acute intracranial abnormality. 2. Bilateral basal ganglia mineralization, somewhat advanced in extent for age. Consider correlation with calcium/parathyroid labs. CT cervical spine: 1. No evidence of acute fracture. 2. Mild rotation of C1 on C2, most likely positional in the absence of a fixed torticollis. 3. Moderate to severe degenerative disc disease at C5-C6 and C6-C7 with at least mild canal and moderate left foraminal stenosis at these levels (potentially severe on the left at C6-C7). An MRI could better evaluate the canal, cord and foramina if clinically indicated. Electronically Signed   By: Feliberto Harts MD   On: 09/16/2020 19:45   CT Cervical Spine Wo Contrast  Result Date: 09/16/2020 CLINICAL DATA:  Trauma EXAM: CT HEAD WITHOUT CONTRAST CT CERVICAL SPINE WITHOUT CONTRAST TECHNIQUE: Multidetector CT imaging of the head and cervical spine was performed following the standard protocol without intravenous contrast. Multiplanar CT image reconstructions of the cervical spine were also generated. COMPARISON:  None. FINDINGS: CT HEAD FINDINGS Brain: No evidence of acute large vascular territory infarction, hemorrhage, hydrocephalus, extra-axial collection or mass lesion/mass effect. Bilateral basal ganglia mineralization, somewhat advanced in extent for age. No edema in this region to suggest superimposed acute hemorrhage. Vascular: No hyperdense vessel identified. Mild calcific atherosclerosis. Skull: No acute fracture. Sinuses/Orbits: Right frontal sinus osteoma. Mild ethmoid air cell mucosal thickening. Unremarkable orbits. Other: No mastoid effusions. CT CERVICAL SPINE FINDINGS Alignment: Straightening of the normal cervical lordosis. No substantial sagittal subluxation. Mild rotation of C1 on C2. Skull base and vertebrae: Vertebral body heights are maintained. Soft tissues and spinal canal: No prevertebral fluid or swelling. No visible canal hematoma. Disc  levels: Moderate to severe degenerative disease at C5-C6 and C6-C7 with disc height loss, endplate sclerosis and posterior disc osteophyte complexes. At least moderate left foraminal stenosis at these levels (potentially severe on the left at C6-C7). Upper chest: Partially imaged dependent ground-glass opacity in the left upper lung, possibly atelectasis. IMPRESSION: CT head: 1. No evidence of acute intracranial abnormality. 2. Bilateral basal ganglia mineralization, somewhat advanced in extent for age. Consider correlation with calcium/parathyroid labs. CT cervical spine: 1. No evidence of acute fracture. 2. Mild rotation of C1 on C2, most likely positional in the absence of a fixed torticollis. 3. Moderate to severe degenerative disc disease  at C5-C6 and C6-C7 with at least mild canal and moderate left foraminal stenosis at these levels (potentially severe on the left at C6-C7). An MRI could better evaluate the canal, cord and foramina if clinically indicated. Electronically Signed   By: Feliberto Harts MD   On: 09/16/2020 19:45   MR ANGIO HEAD WO CONTRAST  Result Date: 09/19/2020 CLINICAL DATA:  Follow-up acute infarction left caudate and left parietal cortex EXAM: MRA HEAD WITHOUT CONTRAST MRA NECK WITHOUT CONTRAST TECHNIQUE: Angiographic images of the Circle of Willis were obtained using MRA technique without intravenous contrast. Angiographic images of the neck were obtained using MRA technique without intravenous contrast. Carotid stenosis measurements (when applicable) are obtained utilizing NASCET criteria, using the distal internal carotid diameter as the denominator. COMPARISON:  MRI yesterday. FINDINGS: MRA HEAD FINDINGS Both internal carotid arteries widely patent through the skull base and siphon regions. On the right, the anterior and middle cerebral arteries are patent and normal. On the left, there is moderate to severe stenosis at both the A1 and M1 segments, with considerable irregularity  particularly of the M1 segment. The patient would be at risk of additional infarction in these vascular territories. Both vertebral arteries widely patent to the basilar. No basilar stenosis. Posterior circulation branch vessels are normal. Left PCA takes fetal origin from the anterior circulation. Large posterior communicating artery on the right. MRA NECK FINDINGS Branching pattern from the arch is normal. Both common carotid arteries are widely patent to the bifurcation. Both carotid bifurcations are widely patent. There may be minimal nonstenotic plaque at the ICA bulb on the left. Both vertebral arteries widely patent from their origins to the basilar. IMPRESSION: No advanced finding in the neck. Minimal atherosclerotic plaque at the ICA bulb on the left but without stenosis. Intracranial MR angiography shows severe stenosis of the left A1 and M1 segments, with considerable irregularity of the M1 segment. Patient would be at risk of additional infarction in these vascular territories. Electronically Signed   By: Paulina Fusi M.D.   On: 09/19/2020 15:55   MR ANGIO NECK WO CONTRAST  Result Date: 09/19/2020 CLINICAL DATA:  Follow-up acute infarction left caudate and left parietal cortex EXAM: MRA HEAD WITHOUT CONTRAST MRA NECK WITHOUT CONTRAST TECHNIQUE: Angiographic images of the Circle of Willis were obtained using MRA technique without intravenous contrast. Angiographic images of the neck were obtained using MRA technique without intravenous contrast. Carotid stenosis measurements (when applicable) are obtained utilizing NASCET criteria, using the distal internal carotid diameter as the denominator. COMPARISON:  MRI yesterday. FINDINGS: MRA HEAD FINDINGS Both internal carotid arteries widely patent through the skull base and siphon regions. On the right, the anterior and middle cerebral arteries are patent and normal. On the left, there is moderate to severe stenosis at both the A1 and M1 segments, with  considerable irregularity particularly of the M1 segment. The patient would be at risk of additional infarction in these vascular territories. Both vertebral arteries widely patent to the basilar. No basilar stenosis. Posterior circulation branch vessels are normal. Left PCA takes fetal origin from the anterior circulation. Large posterior communicating artery on the right. MRA NECK FINDINGS Branching pattern from the arch is normal. Both common carotid arteries are widely patent to the bifurcation. Both carotid bifurcations are widely patent. There may be minimal nonstenotic plaque at the ICA bulb on the left. Both vertebral arteries widely patent from their origins to the basilar. IMPRESSION: No advanced finding in the neck. Minimal atherosclerotic plaque at the ICA  bulb on the left but without stenosis. Intracranial MR angiography shows severe stenosis of the left A1 and M1 segments, with considerable irregularity of the M1 segment. Patient would be at risk of additional infarction in these vascular territories. Electronically Signed   By: Paulina Fusi M.D.   On: 09/19/2020 15:55   MR BRAIN WO CONTRAST  Result Date: 09/19/2020 CLINICAL DATA:  Initial evaluation for acute seizure, abnormal neuro exam. EXAM: MRI HEAD WITHOUT CONTRAST TECHNIQUE: Multiplanar, multiecho pulse sequences of the brain and surrounding structures were obtained without intravenous contrast. COMPARISON:  Prior CT from 09/16/2020. FINDINGS: Brain: Cerebral volume within normal limits for age. Minimal T2/FLAIR hyperintensity noted involving the periventricular white matter, nonspecific, but most likely related to chronic microvascular ischemic disease, extremely mild for age. 1.2 cm focus of diffusion abnormality seen involving the left caudate, consistent with an acute to early subacute ischemic infarct (series 5, image 83). No associated hemorrhage or mass effect. No other diffusion abnormality to suggest acute or subacute ischemia or  changes related to seizure. Gray-white matter differentiation otherwise maintained. No encephalomalacia to suggest chronic cortical infarction elsewhere within the brain. No other evidence for acute or chronic intracranial hemorrhage. No mass lesion, midline shift or mass effect. No hydrocephalus or extra-axial fluid collection. Pituitary gland suprasellar region within normal limits. Midline structures intact. No intrinsic temporal lobe abnormality. Vascular: Major intracranial vascular flow voids are maintained. Skull and upper cervical spine: Craniocervical junction within normal limits. Bone marrow signal intensity within normal limits. Susceptibility artifact emanates from the left occipital scalp. Scalp soft tissues demonstrate no other acute finding. Sinuses/Orbits: Globes and orbital soft tissues demonstrate no acute finding. Paranasal sinuses are largely clear. Small to moderate bilateral mastoid effusions. Visualized nasopharynx within normal limits. Other: None. IMPRESSION: 1. 1.2 cm focus of diffusion abnormality involving the left caudate, consistent with an acute to early subacute ischemic infarct. No associated hemorrhage or mass effect. 2. No other acute intracranial abnormality. Electronically Signed   By: Rise Mu M.D.   On: 09/19/2020 00:58   DG Chest Port 1 View  Result Date: 09/18/2020 CLINICAL DATA:  Leukocytosis.  History of trauma. EXAM: PORTABLE CHEST 1 VIEW COMPARISON:  Chest x-ray 09/16/2020. FINDINGS: Right PICC line noted with its tip over the lower right atrium. Proximal repositioning of approximately 8 cm should be considered. Endotracheal tube and NG tube in stable position. Heart size stable. Persistent right base atelectasis/infiltrate. Tiny right pleural effusion cannot be excluded. No pneumothorax. A fracture of the posterolateral aspect of the left fifth rib cannot be excluded. IMPRESSION: 1. Right PICC line noted with its tip over the lower right atrium. Proximal  repositioning of approximately 8 cm should be considered. Endotracheal tube and NG tube in stable position. 2. Persistent right base atelectasis/infiltrate. Tiny right pleural effusion cannot be excluded. 3. A fracture of the posterolateral aspect of left rib cannot be excluded. No pneumothorax. Electronically Signed   By: Maisie Fus  Register   On: 09/18/2020 05:53   DG Chest Portable 1 View  Result Date: 09/16/2020 CLINICAL DATA:  Intubated EXAM: PORTABLE CHEST 1 VIEW COMPARISON:  Chest radiograph from earlier today. FINDINGS: Endotracheal tube tip is 4.1 cm above the carina. Enteric tube enters stomach with the tip not seen on this image. Stable cardiomediastinal silhouette with normal heart size. No pneumothorax. Stable small right pleural effusion. No left pleural effusion. Increased curvilinear right lung base atelectasis. Stable mild elevation of the right hemidiaphragm. No pulmonary edema. IMPRESSION: 1. Well-positioned endotracheal and enteric tubes.  2. Stable small right pleural effusion. 3. Increased curvilinear right lung base atelectasis. Electronically Signed   By: Delbert Phenix M.D.   On: 09/16/2020 19:04   DG Chest Port 1 View  Result Date: 09/16/2020 CLINICAL DATA:  Altered level of consciousness. EXAM: PORTABLE CHEST 1 VIEW COMPARISON:  None available. FINDINGS: Patient is rotated. Lung volumes are low. Upper normal heart size likely accentuated by technique. Elevation of right hemidiaphragm. Adjacent right basilar opacity. No pulmonary edema. No pneumothorax. No large pleural effusion. No acute osseous abnormalities are seen IMPRESSION: Rotated exam with low lung volumes. Elevation of right hemidiaphragm with adjacent right basilar opacity, probable atelectasis. Electronically Signed   By: Narda Rutherford M.D.   On: 09/16/2020 18:23   Overnight EEG with video  Result Date: 09/17/2020 Charlsie Quest, MD     09/18/2020  8:51 AM Patient Name: Sherrina Zaugg MRN: [id number] Epilepsy Attending:  Charlsie Quest Referring Physician/Provider: Dr Erick Blinks Duration: 09/17/2020 0053 to 09/18/2020 0053   Patient history: 55 y.o. female with PMH significant for diabetes, HTN who was found down and had several seizures with clustering. EEG to evaluate for seizure.   Level of alertness: asleep/sedated   AEDs during EEG study: Propofol, Versed, LEV   Technical aspects: This EEG study was done with scalp electrodes positioned according to the 10-20 International system of electrode placement. Electrical activity was acquired at a sampling rate of 500Hz  and reviewed with a high frequency filter of 70Hz  and a low frequency filter of 1Hz . EEG data were recorded continuously and digitally stored.   Description: EEG initially showed burst suppression pattern with generalized, maximal frontal bursts of sharply contoured 15-18Hz  beta activity lasting 2-3 seconds alternating with 3-5 seconds of generalized eeg suppression. Gradually after sedation was weaned off, the duration of EEG suppression became shorter and eventually EEG showed continuous generalized 2 to 5 Hz theta-delta slowing. ABNORMALITY -Burst suppression, generalized -Continuous slow, generalized   IMPRESSION: This study was initially suggestive of profound diffuse encephalopathy, nonspecific etiology but likely related to sedation.   Gradually as sedation was weaned, EEG was suggestive of moderate to severe diffuse encephalopathy.  No seizures or epileptiform discharges were seen throughout the recording.       ECHOCARDIOGRAM COMPLETE  Result Date: 09/20/2020    ECHOCARDIOGRAM REPORT   Patient Name:   JOSCELYNE RENVILLE Date of Exam: 09/20/2020 Medical Rec #:  11/20/2020      Height:       63.0 in Accession #:    Willernie Desanctis     Weight:       136.2 lb Date of Birth:  [birth year]       BSA:          1.643 m Patient Age:    54 years       BP:           144/92 mmHg Patient Gender: F              HR:           99 bpm. Exam Location:  Inpatient  Procedure: 2D Echo, Cardiac Doppler and Color Doppler Indications:    CVA  History:        Patient has no prior history of Echocardiogram examinations.                 Signs/Symptoms:Fever. Epilepsy.  Sonographer:    [id number] Referring Phys: [id number] PRIYANKA O YADAV IMPRESSIONS  1. Left ventricular ejection fraction, by  estimation, is 60 to 65%. The left ventricle has normal function. The left ventricle has no regional wall motion abnormalities. There is mild concentric left ventricular hypertrophy of the septal and basal-septal segments. Left ventricular diastolic parameters are consistent with Grade I diastolic dysfunction (impaired relaxation).  2. Right ventricular systolic function is normal. The right ventricular size is normal. Tricuspid regurgitation signal is inadequate for assessing PA pressure.  3. The mitral valve is normal in structure. No evidence of mitral valve regurgitation. No evidence of mitral stenosis.  4. The aortic valve is tricuspid. Aortic valve regurgitation is not visualized. Mild to moderate aortic valve sclerosis/calcification is present, without any evidence of aortic stenosis.  5. The inferior vena cava is normal in size with greater than 50% respiratory variability, suggesting right atrial pressure of 3 mmHg. FINDINGS  Left Ventricle: Left ventricular ejection fraction, by estimation, is 60 to 65%. The left ventricle has normal function. The left ventricle has no regional wall motion abnormalities. The left ventricular internal cavity size was normal in size. There is  mild concentric left ventricular hypertrophy of the septal and basal-septal segments. Left ventricular diastolic parameters are consistent with Grade I diastolic dysfunction (impaired relaxation). Normal left ventricular filling pressure. Right Ventricle: The right ventricular size is normal. No increase in right ventricular wall thickness. Right ventricular systolic function is normal. Tricuspid regurgitation  signal is inadequate for assessing PA pressure. Left Atrium: Left atrial size was normal in size. Right Atrium: Right atrial size was normal in size. Pericardium: There is no evidence of pericardial effusion. Mitral Valve: The mitral valve is normal in structure. No evidence of mitral valve regurgitation. No evidence of mitral valve stenosis. Tricuspid Valve: The tricuspid valve is normal in structure. Tricuspid valve regurgitation is not demonstrated. No evidence of tricuspid stenosis. Aortic Valve: The aortic valve is tricuspid. Aortic valve regurgitation is not visualized. Mild to moderate aortic valve sclerosis/calcification is present, without any evidence of aortic stenosis. Pulmonic Valve: The pulmonic valve was normal in structure. Pulmonic valve regurgitation is not visualized. No evidence of pulmonic stenosis. Aorta: The aortic root is normal in size and structure. Venous: The inferior vena cava is normal in size with greater than 50% respiratory variability, suggesting right atrial pressure of 3 mmHg. IAS/Shunts: No atrial level shunt detected by color flow Doppler.  LEFT VENTRICLE PLAX 2D LVIDd:         4.10 cm      Diastology LVIDs:         2.80 cm      LV e' medial:    5.22 cm/s LV PW:         1.20 cm      LV E/e' medial:  12.9 LV IVS:        1.30 cm      LV e' lateral:   6.31 cm/s LVOT diam:     2.10 cm      LV E/e' lateral: 10.7 LV SV:         61 LV SV Index:   37 LVOT Area:     3.46 cm  LV Volumes (MOD) LV vol d, MOD A4C: 104.0 ml LV vol s, MOD A4C: 27.9 ml LV SV MOD A4C:     104.0 ml RIGHT VENTRICLE RV Basal diam:  2.80 cm RV S prime:     10.40 cm/s TAPSE (M-mode): 1.6 cm LEFT ATRIUM             Index  RIGHT ATRIUM          Index LA diam:        3.20 cm 1.95 cm/m  RA Area:     8.29 cm LA Vol (A2C):   29.5 ml 17.96 ml/m RA Volume:   14.70 ml 8.95 ml/m LA Vol (A4C):   39.8 ml 24.23 ml/m LA Biplane Vol: 34.5 ml 21.00 ml/m  AORTIC VALVE LVOT Vmax:   99.00 cm/s LVOT Vmean:  68.000 cm/s LVOT  VTI:    0.175 m  AORTA Ao Root diam: 2.50 cm MITRAL VALVE MV Area (PHT): 3.85 cm     SHUNTS MV Decel Time: 197 msec     Systemic VTI:  0.18 m MV E velocity: 67.40 cm/s   Systemic Diam: 2.10 cm MV A velocity: 111.00 cm/s MV E/A ratio:  0.61 Armanda Magicraci Turner MD Electronically signed by Armanda Magicraci Turner MD Signature Date/Time: 09/20/2020/1:51:44 PM    Final    US EKG SITE RITE  Result Date: 09/17/2020 If Site Rite image not attached, placement could not be confirmed due to current cardiac rhythm.    Assessment & Plan:   Status epilepticus (HCC) Status epilepticus /acute/subacute CVA  Continue to follow with neurology  Continue Keppra  Continue ASA  Continue PT   Essential hypertension  -Continue amlodipine and Enalapril- Stop HCTZ   Type 2 diabetes mellitus , uncontrolled with hyperglycemia with long-term current use of insulin  -HBA1c > 15.5  -Continue on Lantus and sliding scale insulin in the hospital   Follow up:  Follow up to establish care with PCP     Tricia Andrewonya S Reice Bienvenue, NP 10/01/2020

## 2020-10-17 ENCOUNTER — Other Ambulatory Visit: Payer: Self-pay

## 2020-10-22 ENCOUNTER — Telehealth: Payer: Self-pay | Admitting: Nurse Practitioner

## 2020-10-22 ENCOUNTER — Other Ambulatory Visit: Payer: Self-pay | Admitting: Nurse Practitioner

## 2020-10-22 MED ORDER — METFORMIN HCL ER 750 MG PO TB24: 750.0000 mg | ORAL_TABLET | Freq: Every day | ORAL | 1 refills | Status: DC

## 2020-10-22 MED ORDER — CLOPIDOGREL BISULFATE 75 MG PO TABS: 75.0000 mg | ORAL_TABLET | Freq: Every day | ORAL | 1 refills | Status: DC

## 2020-10-22 NOTE — Telephone Encounter (Signed)
Refills sent - notified daughter

## 2020-10-22 NOTE — Telephone Encounter (Signed)
Patient's daughter called to request refills on the following meds to carry patient through to her new patient appt on 8/25.  Metformin 750mg  Clopibogrel 75mg   Please call daughter at [phone]

## 2020-11-17 ENCOUNTER — Emergency Department (HOSPITAL_COMMUNITY): Payer: 59

## 2020-11-17 ENCOUNTER — Emergency Department (HOSPITAL_COMMUNITY)
Admission: EM | Admit: 2020-11-17 | Discharge: 2020-11-18 | Disposition: A | Discharge status: Home / Self Care | Payer: 59 | Attending: Emergency Medicine | Admitting: Emergency Medicine

## 2020-11-17 ENCOUNTER — Other Ambulatory Visit: Payer: Self-pay

## 2020-11-17 ENCOUNTER — Encounter (HOSPITAL_COMMUNITY): Payer: Self-pay | Admitting: Emergency Medicine

## 2020-11-17 DIAGNOSIS — R739 Hyperglycemia, unspecified: Secondary | ICD-10-CM

## 2020-11-17 DIAGNOSIS — N390 Urinary tract infection, site not specified: Secondary | ICD-10-CM | POA: Diagnosis not present

## 2020-11-17 DIAGNOSIS — Z7984 Long term (current) use of oral hypoglycemic drugs: Secondary | ICD-10-CM | POA: Insufficient documentation

## 2020-11-17 DIAGNOSIS — I10 Essential (primary) hypertension: Secondary | ICD-10-CM | POA: Insufficient documentation

## 2020-11-17 DIAGNOSIS — E119 Type 2 diabetes mellitus without complications: Secondary | ICD-10-CM

## 2020-11-17 DIAGNOSIS — Z79899 Other long term (current) drug therapy: Secondary | ICD-10-CM | POA: Insufficient documentation

## 2020-11-17 DIAGNOSIS — Z7982 Long term (current) use of aspirin: Secondary | ICD-10-CM | POA: Insufficient documentation

## 2020-11-17 DIAGNOSIS — I63512 Cerebral infarction due to unspecified occlusion or stenosis of left middle cerebral artery: Secondary | ICD-10-CM

## 2020-11-17 DIAGNOSIS — Z794 Long term (current) use of insulin: Secondary | ICD-10-CM | POA: Insufficient documentation

## 2020-11-17 DIAGNOSIS — E1165 Type 2 diabetes mellitus with hyperglycemia: Secondary | ICD-10-CM | POA: Insufficient documentation

## 2020-11-17 DIAGNOSIS — G40901 Epilepsy, unspecified, not intractable, with status epilepticus: Secondary | ICD-10-CM

## 2020-11-17 DIAGNOSIS — Z7902 Long term (current) use of antithrombotics/antiplatelets: Secondary | ICD-10-CM | POA: Insufficient documentation

## 2020-11-17 DIAGNOSIS — N3000 Acute cystitis without hematuria: Secondary | ICD-10-CM

## 2020-11-17 LAB — CBC WITH DIFFERENTIAL/PLATELET
Abs Immature Granulocytes: 0.03 10*3/uL (ref 0.00–0.07)
Basophils Absolute: 0.1 10*3/uL (ref 0.0–0.1)
Basophils Relative: 0 %
Eosinophils Absolute: 0 10*3/uL (ref 0.0–0.5)
Eosinophils Relative: 0 %
HCT: 40.3 % (ref 36.0–46.0)
Hemoglobin: 13.5 g/dL (ref 12.0–15.0)
Immature Granulocytes: 0 %
Lymphocytes Relative: 5 %
Lymphs Abs: 0.5 10*3/uL — ABNORMAL LOW (ref 0.7–4.0)
MCH: 29.6 pg (ref 26.0–34.0)
MCHC: 33.5 g/dL (ref 30.0–36.0)
MCV: 88.4 fL (ref 80.0–100.0)
Monocytes Absolute: 0.8 10*3/uL (ref 0.1–1.0)
Monocytes Relative: 7 %
Neutro Abs: 10 10*3/uL — ABNORMAL HIGH (ref 1.7–7.7)
Neutrophils Relative %: 88 %
Platelets: 203 10*3/uL (ref 150–400)
RBC: 4.56 MIL/uL (ref 3.87–5.11)
RDW: 12.1 % (ref 11.5–15.5)
WBC: 11.4 10*3/uL — ABNORMAL HIGH (ref 4.0–10.5)
nRBC: 0 % (ref 0.0–0.2)

## 2020-11-17 LAB — COMPREHENSIVE METABOLIC PANEL
ALT: 28 U/L (ref 0–44)
AST: 29 U/L (ref 15–41)
Albumin: 3.5 g/dL (ref 3.5–5.0)
Alkaline Phosphatase: 98 U/L (ref 38–126)
Anion gap: 12 (ref 5–15)
BUN: 15 mg/dL (ref 6–20)
CO2: 24 mmol/L (ref 22–32)
Calcium: 9.2 mg/dL (ref 8.9–10.3)
Chloride: 100 mmol/L (ref 98–111)
Creatinine, Ser: 0.92 mg/dL (ref 0.44–1.00)
GFR, Estimated: >60 mL/min (ref >60)
Glucose, Bld: 287 mg/dL — ABNORMAL HIGH (ref 70–99)
Potassium: 3.4 mmol/L — ABNORMAL LOW (ref 3.5–5.1)
Sodium: 136 mmol/L (ref 135–145)
Total Bilirubin: 0.8 mg/dL (ref 0.3–1.2)
Total Protein: 7.4 g/dL (ref 6.5–8.1)

## 2020-11-17 LAB — URINALYSIS, ROUTINE W REFLEX MICROSCOPIC
Bilirubin Urine: NEGATIVE
Glucose, UA: 50 mg/dL — AB
Ketones, ur: NEGATIVE mg/dL
Nitrite: NEGATIVE
Protein, ur: 100 mg/dL — AB
Specific Gravity, Urine: 1.013 (ref 1.005–1.030)
WBC, UA: >50 WBC/hpf — ABNORMAL HIGH (ref 0–5)
pH: 5 (ref 5.0–8.0)

## 2020-11-17 LAB — I-STAT VENOUS BLOOD GAS, ED
Acid-Base Excess: 5 mmol/L — ABNORMAL HIGH (ref 0.0–2.0)
Bicarbonate: 27.6 mmol/L (ref 20.0–28.0)
Calcium, Ion: 1.04 mmol/L — ABNORMAL LOW (ref 1.15–1.40)
HCT: 41 % (ref 36.0–46.0)
Hemoglobin: 13.9 g/dL (ref 12.0–15.0)
O2 Saturation: 34 %
Potassium: 4 mmol/L (ref 3.5–5.1)
Sodium: 137 mmol/L (ref 135–145)
TCO2: 29 mmol/L (ref 22–32)
pCO2, Ven: 34.3 mmHg — ABNORMAL LOW (ref 44.0–60.0)
pH, Ven: 7.513 — ABNORMAL HIGH (ref 7.250–7.430)
pO2, Ven: 18 mmHg — CL (ref 32.0–45.0)

## 2020-11-17 LAB — BETA-HYDROXYBUTYRIC ACID: Beta-Hydroxybutyric Acid: 0.11 mmol/L (ref 0.05–0.27)

## 2020-11-17 NOTE — ED Provider Notes (Signed)
Emergency Medicine Provider Triage Evaluation Note  Tricia Bridges , a 55 y.o. female  was evaluated in triage.  Pt complains of hyperglycemia and dysuria. Patient states glucose has been elevated in the 200s. No abdominal pain, nausea, or vomiting. She has not taken her insulin in several days. She also endorses dysuria x5 days. She had chills earlier today, but no fever. Denies cough, sore throat, and nasal congestion.   Review of Systems  Positive: dysuria Negative: Abdominal pain  Physical Exam  BP (!) 141/87 (BP Location: Left Arm)   Pulse (!) 127   Temp 100.2 F (37.9 C) (Oral)   Resp 18   Ht 5\' 5"  (1.651 m)   Wt 71 kg   SpO2 100%   BMI 26.05 kg/m  Gen:   Awake, no distress   Resp:  Normal effort  MSK:   Moves extremities without difficulty  Other:    Medical Decision Making  Medically screening exam initiated at 10:16 PM.  Appropriate orders placed.  Jernie Schutt was informed that the remainder of the evaluation will be completed by another provider, this initial triage assessment does not replace that evaluation, and the importance of remaining in the ED until their evaluation is complete.  Hyperglycemia labs CXR and UA to rule out infection given fever    Stover Desanctis 11/17/20 2219    2220, MD 11/22/20 [phone]

## 2020-11-17 NOTE — ED Triage Notes (Signed)
Patient reports elevated blood sugar this evening 206, she has not taken her insulin for several days , patient stated feeling jittery , dysuria this week , mild headache .

## 2020-11-18 LAB — CBG MONITORING, ED: Glucose-Capillary: 227 mg/dL — ABNORMAL HIGH (ref 70–99)

## 2020-11-18 MED ORDER — LEVETIRACETAM 500 MG PO TABS: 500.0000 mg | ORAL_TABLET | Freq: Two times a day (BID) | ORAL | 0 refills | Status: DC

## 2020-11-18 MED ORDER — ENALAPRIL MALEATE 10 MG PO TABS: ORAL_TABLET | ORAL | 0 refills | Status: DC

## 2020-11-18 MED ORDER — CEPHALEXIN 500 MG PO CAPS: 500.0000 mg | ORAL_CAPSULE | Freq: Four times a day (QID) | ORAL | 0 refills | Status: DC

## 2020-11-18 MED ORDER — CEPHALEXIN 250 MG PO CAPS
500.0000 mg | ORAL_CAPSULE | Freq: Once | ORAL | Status: AC
  Administered 2020-11-18: 500 mg via ORAL
  Filled 2020-11-18: qty 2

## 2020-11-18 MED ORDER — LANTUS SOLOSTAR 100 UNIT/ML ~~LOC~~ SOPN: 20.0000 [IU] | PEN_INJECTOR | Freq: Every day | SUBCUTANEOUS | 0 refills | Status: DC

## 2020-11-18 MED ORDER — METFORMIN HCL ER 750 MG PO TB24: 750.0000 mg | ORAL_TABLET | Freq: Every day | ORAL | 0 refills | Status: DC

## 2020-11-18 MED ORDER — AMLODIPINE BESYLATE 5 MG PO TABS: ORAL_TABLET | ORAL | 0 refills | Status: DC

## 2020-11-18 NOTE — ED Notes (Signed)
Pt discharged and ambulated out of the ED without difficulty. 

## 2020-11-18 NOTE — Discharge Instructions (Addendum)
Keep your scheduled appointment with your doctor for later this month for regular medical care and routine prescription refills.   Take Keflex 4 times daily for the next 5 days.

## 2020-11-18 NOTE — ED Provider Notes (Signed)
[facility] EMERGENCY DEPARTMENT Provider Note   CSN: [id number] Arrival date & time: 11/17/20  2207     History Chief Complaint  Patient presents with   Hyperglycemia    Tricia Bridges is a 55 y.o. female.  Patient with a history of T2DM, HTN, CVA, HLD, seizures presents for evaluation of high blood sugar and urinary symptoms including burning with urination and frequency. No fever, nausea, vomiting, respiratory symptoms. She reports she is out of her insulin and Metformin and will be out of all the rest of her medications over the coming week. No appointment with primary care available until the 18th.   The history is provided by the patient. No language interpreter was used.  Hyperglycemia Associated symptoms: dysuria and polyuria   Associated symptoms: no fever       Past Medical History:  Diagnosis Date   Diabetes mellitus without complication (HCC)    Hypertension    Pre-diabetes    Seizures (HCC)    Stroke (cerebrum) (HCC) 09/30/2020    Patient Active Problem List   Diagnosis Date Noted   Stroke (cerebrum) (HCC) 09/30/2020   Non-traumatic rhabdomyolysis    Status epilepticus (HCC) 09/16/2020   Acute respiratory failure with hypercapnia (HCC)    HHNC (hyperglycemic hyperosmolar nonketotic coma) (HCC)    Acute metabolic encephalopathy    Mixed hyperlipidemia 11/25/2017   Well adult exam 09/24/2017   Type 2 diabetes mellitus without complication, with long-term current use of insulin (HCC) 08/13/2017   Essential hypertension 08/13/2017   Breast cancer screening 08/13/2017   Bilateral carpal tunnel syndrome 07/29/2017    Past Surgical History:  Procedure Laterality Date   CESAREAN SECTION       OB History   No obstetric history on file.     Family History  Problem Relation Age of Onset   Hypertension Mother    COPD Mother        Smoker   Alcohol abuse Father    Hypertension Sister     Social History   Tobacco Use   Smoking  status: Never   Smokeless tobacco: Never  Vaping Use   Vaping Use: Never used  Substance Use Topics   Alcohol use: No   Drug use: No    Home Medications Prior to Admission medications   Medication Sig Start Date End Date Taking? Authorizing Provider  amLODipine (NORVASC) 5 MG tablet TAKE 1 TABLET(5 MG) BY MOUTH DAILY 09/22/20   Calvert Cantor, MD  aspirin EC 81 MG tablet Take 1 tablet (81 mg total) by mouth daily. 09/22/20   Calvert Cantor, MD  atorvastatin (LIPITOR) 40 MG tablet Take 1 tablet (40 mg total) by mouth daily. 09/22/20   Calvert Cantor, MD  Cholecalciferol (VITAMIN D PO) Take 1 tablet by mouth daily.    [provider]  clopidogrel (PLAVIX) 75 MG tablet Take 1 tablet (75 mg total) by mouth daily. 10/22/20   Ivonne Andrew, NP  enalapril (VASOTEC) 10 MG tablet TAKE 1 TABLET(10 MG) BY MOUTH DAILY 09/22/20   Calvert Cantor, MD  fluticasone (FLONASE) 50 MCG/ACT nasal spray Place 2 sprays into both nostrils daily. 09/22/20   Calvert Cantor, MD  glucose blood test strip Test blood glucose twice daily. Contour next strips. DX E11.56 09/24/17   Everrett Coombe, DO  insulin glargine (LANTUS SOLOSTAR) 100 UNIT/ML Solostar Pen Inject 20 Units into the skin at bedtime. Start tomorrow night 10/01/20   Ivonne Andrew, NP  Insulin Pen Needle (PEN NEEDLES 31GX5/16")  31G X 8 MM MISC Use as directed 10/01/20   Ivonne Andrew, NP  levETIRAcetam (KEPPRA) 500 MG tablet Take 1 tablet (500 mg total) by mouth 2 (two) times daily. 09/30/20   York Spaniel, MD  metFORMIN (GLUCOPHAGE-XR) 750 MG 24 hr tablet Take 1 tablet (750 mg total) by mouth daily with breakfast. 10/22/20   Ivonne Andrew, NP  Vitamin E 400 units TABS Take 1 tablet (400 Units total) by mouth daily. 11/25/17   Everrett Coombe, DO    Allergies    Penicillins  Review of Systems   Review of Systems  Constitutional:  Negative for chills and fever.  HENT: Negative.    Respiratory: Negative.    Cardiovascular: Negative.    Gastrointestinal: Negative.   Endocrine: Positive for polyuria.  Genitourinary:  Positive for dysuria and frequency. Negative for flank pain.  Musculoskeletal: Negative.   Skin: Negative.   Neurological: Negative.    Physical Exam Updated Vital Signs BP 125/85 (BP Location: Left Arm)   Pulse 87   Temp 98 F (36.7 C)   Resp 17   Ht 5\' 5"  (1.651 m)   Wt 71 kg   SpO2 100%   BMI 26.05 kg/m   Physical Exam Vitals and nursing note reviewed.  Constitutional:      Appearance: She is well-developed.  HENT:     Head: Normocephalic.  Cardiovascular:     Rate and Rhythm: Normal rate and regular rhythm.     Heart sounds: No murmur heard. Pulmonary:     Effort: Pulmonary effort is normal.     Breath sounds: Normal breath sounds. No wheezing, rhonchi or rales.  Abdominal:     General: Bowel sounds are normal.     Palpations: Abdomen is soft.     Tenderness: There is no abdominal tenderness. There is no guarding or rebound.  Musculoskeletal:        General: Normal range of motion.     Cervical back: Normal range of motion and neck supple.  Skin:    General: Skin is warm and dry.  Neurological:     General: No focal deficit present.     Mental Status: She is alert and oriented to person, place, and time.    ED Results / Procedures / Treatments   Labs (all labs ordered are listed, but only abnormal results are displayed) Labs Reviewed  CBC WITH DIFFERENTIAL/PLATELET - Abnormal; Notable for the following components:      Result Value   WBC 11.4 (*)    Neutro Abs 10.0 (*)    Lymphs Abs 0.5 (*)    All other components within normal limits  COMPREHENSIVE METABOLIC PANEL - Abnormal; Notable for the following components:   Potassium 3.4 (*)    Glucose, Bld 287 (*)    All other components within normal limits  URINALYSIS, ROUTINE W REFLEX MICROSCOPIC - Abnormal; Notable for the following components:   APPearance TURBID (*)    Glucose, UA 50 (*)    Hgb urine dipstick MODERATE  (*)    Protein, ur 100 (*)    Leukocytes,Ua LARGE (*)    WBC, UA >50 (*)    Bacteria, UA RARE (*)    Non Squamous Epithelial 0-5 (*)    All other components within normal limits  I-STAT VENOUS BLOOD GAS, ED - Abnormal; Notable for the following components:   pH, Ven 7.513 (*)    pCO2, Ven 34.3 (*)    pO2, Ven 18.0 (*)  Acid-Base Excess 5.0 (*)    Calcium, Ion 1.04 (*)    All other components within normal limits  CBG MONITORING, ED - Abnormal; Notable for the following components:   Glucose-Capillary 227 (*)    All other components within normal limits  BETA-HYDROXYBUTYRIC ACID    EKG EKG Interpretation  Date/Time:  Monday November 18 2020 02:03:59 EDT Ventricular Rate:  90 PR Interval:  128 QRS Duration: 76 QT Interval:  382 QTC Calculation: 467 R Axis:   -29 Text Interpretation: Normal sinus rhythm Normal ECG When compared with ECG of 09/16/2020, HEART RATE has decreased Confirmed by Dione Booze ([id number]) on 11/18/2020 2:23:23 AM  Radiology DG Chest Portable 1 View  Result Date: 11/17/2020 CLINICAL DATA:  Fever EXAM: PORTABLE CHEST 1 VIEW COMPARISON:  09/18/2020 FINDINGS: The heart size and mediastinal contours are within normal limits. Both lungs are clear. The visualized skeletal structures are unremarkable. IMPRESSION: No active disease. Electronically Signed   By: Jasmine Pang M.D.   On: 11/17/2020 23:00    Procedures Procedures   Medications Ordered in ED Medications - No data to display  ED Course  I have reviewed the triage vital signs and the nursing notes.  Pertinent labs & imaging results that were available during my care of the patient were reviewed by me and considered in my medical decision making (see chart for details).    MDM Rules/Calculators/A&P                           Patient to ED concerned about high blood sugar and reporting she is out of her insulin and Metformin. She also reports dysuria and urinary frequency stating if feels like she has a  UTI. No other symptoms. No nausea, vomiting, fever.   Very well appearing. VSS. CBG mildly elevated at 287 without evidence of acidosis. She appears very comfortable. UA c/w UTI in the setting of reported symptoms. Will treat with abx.   Will refill medications. No further intervention required tonight. She is stable for discharge home.   Final Clinical Impression(s) / ED Diagnoses Final diagnoses:  None   Hyperglycemia UTi  Rx / DC Orders ED Discharge Orders     None        Danne Harbor 11/18/20 0332    Dione Booze, MD 11/18/20 [phone]

## 2020-12-05 ENCOUNTER — Other Ambulatory Visit: Payer: Self-pay

## 2020-12-05 ENCOUNTER — Ambulatory Visit (INDEPENDENT_AMBULATORY_CARE_PROVIDER_SITE_OTHER): Payer: 59 | Admitting: Nurse Practitioner

## 2020-12-05 ENCOUNTER — Encounter: Payer: Self-pay | Admitting: Nurse Practitioner

## 2020-12-05 VITALS — BP 136/81 | HR 68 | Temp 97.3°F | Ht 64.0 in | Wt 147.1 lb

## 2020-12-05 DIAGNOSIS — I639 Cerebral infarction, unspecified: Secondary | ICD-10-CM

## 2020-12-05 DIAGNOSIS — I160 Hypertensive urgency: Secondary | ICD-10-CM

## 2020-12-05 DIAGNOSIS — Z794 Long term (current) use of insulin: Secondary | ICD-10-CM

## 2020-12-05 DIAGNOSIS — G40901 Epilepsy, unspecified, not intractable, with status epilepticus: Secondary | ICD-10-CM

## 2020-12-05 DIAGNOSIS — E782 Mixed hyperlipidemia: Secondary | ICD-10-CM

## 2020-12-05 DIAGNOSIS — E119 Type 2 diabetes mellitus without complications: Secondary | ICD-10-CM | POA: Diagnosis not present

## 2020-12-05 DIAGNOSIS — I10 Essential (primary) hypertension: Secondary | ICD-10-CM

## 2020-12-05 DIAGNOSIS — Z1231 Encounter for screening mammogram for malignant neoplasm of breast: Secondary | ICD-10-CM

## 2020-12-05 LAB — POCT URINALYSIS DIPSTICK
Bilirubin, UA: NEGATIVE
Clarity, UA: NEGATIVE
Glucose, UA: NEGATIVE
Ketones, UA: NEGATIVE
Leukocytes, UA: NEGATIVE
Nitrite, UA: NEGATIVE
Protein, UA: NEGATIVE
Spec Grav, UA: 1.025 (ref 1.010–1.025)
Urobilinogen, UA: 0.2 E.U./dL
pH, UA: 5.5 (ref 5.0–8.0)

## 2020-12-05 MED ORDER — ENALAPRIL MALEATE 10 MG PO TABS: 10.0000 mg | ORAL_TABLET | Freq: Every day | ORAL | 3 refills | Status: DC

## 2020-12-05 MED ORDER — LANTUS SOLOSTAR 100 UNIT/ML ~~LOC~~ SOPN: 20.0000 [IU] | PEN_INJECTOR | Freq: Every day | SUBCUTANEOUS | 0 refills | Status: DC

## 2020-12-05 MED ORDER — CLONIDINE HCL 0.1 MG PO TABS
0.2000 mg | ORAL_TABLET | Freq: Once | ORAL | Status: AC
  Administered 2020-12-05: 0.2 mg via ORAL

## 2020-12-05 MED ORDER — METFORMIN HCL ER 750 MG PO TB24: 750.0000 mg | ORAL_TABLET | Freq: Every day | ORAL | 3 refills | Status: DC

## 2020-12-05 MED ORDER — AMLODIPINE BESYLATE 10 MG PO TABS: 10.0000 mg | ORAL_TABLET | Freq: Every day | ORAL | 3 refills | Status: DC

## 2020-12-05 MED ORDER — ATORVASTATIN CALCIUM 40 MG PO TABS: 40.0000 mg | ORAL_TABLET | Freq: Every day | ORAL | 3 refills | Status: DC

## 2020-12-05 NOTE — Patient Instructions (Signed)
Health Maintenance, Female Adopting a healthy lifestyle and getting preventive care are important in promoting health and wellness. Ask your health care provider about: The right schedule for you to have regular tests and exams. Things you can do on your own to prevent diseases and keep yourself healthy. What should I know about diet, weight, and exercise? Eat a healthy diet  Eat a diet that includes plenty of vegetables, fruits, low-fat dairy products, and lean protein. Do not eat a lot of foods that are high in solid fats, added sugars, or sodium.  Maintain a healthy weight Body mass index (BMI) is used to identify weight problems. It estimates body fat based on height and weight. Your health care provider can help determineyour BMI and help you achieve or maintain a healthy weight. Get regular exercise Get regular exercise. This is one of the most important things you can do for your health. Most adults should: Exercise for at least 150 minutes each week. The exercise should increase your heart rate and make you sweat (moderate-intensity exercise). Do strengthening exercises at least twice a week. This is in addition to the moderate-intensity exercise. Spend less time sitting. Even light physical activity can be beneficial. Watch cholesterol and blood lipids Have your blood tested for lipids and cholesterol at 57 years of age, then havethis test every 5 years. Have your cholesterol levels checked more often if: Your lipid or cholesterol levels are high. You are older than 57 years of age. You are at high risk for heart disease. What should I know about cancer screening? Depending on your health history and family history, you may need to have cancer screening at various ages. This may include screening for: Breast cancer. Cervical cancer. Colorectal cancer. Skin cancer. Lung cancer. What should I know about heart disease, diabetes, and high blood pressure? Blood pressure and heart  disease High blood pressure causes heart disease and increases the risk of stroke. This is more likely to develop in people who have high blood pressure readings, are of African descent, or are overweight. Have your blood pressure checked: Every 3-5 years if you are 18-39 years of age. Every year if you are 40 years old or older. Diabetes Have regular diabetes screenings. This checks your fasting blood sugar level. Have the screening done: Once every three years after age 40 if you are at a normal weight and have a low risk for diabetes. More often and at a younger age if you are overweight or have a high risk for diabetes. What should I know about preventing infection? Hepatitis B If you have a higher risk for hepatitis B, you should be screened for this virus. Talk with your health care provider to find out if you are at risk forhepatitis B infection. Hepatitis C Testing is recommended for: Everyone born from 1945 through 1965. Anyone with known risk factors for hepatitis C. Sexually transmitted infections (STIs) Get screened for STIs, including gonorrhea and chlamydia, if: You are sexually active and are younger than 57 years of age. You are older than 57 years of age and your health care provider tells you that you are at risk for this type of infection. Your sexual activity has changed since you were last screened, and you are at increased risk for chlamydia or gonorrhea. Ask your health care provider if you are at risk. Ask your health care provider about whether you are at high risk for HIV. Your health care provider may recommend a prescription medicine to help   prevent HIV infection. If you choose to take medicine to prevent HIV, you should first get tested for HIV. You should then be tested every 3 months for as long as you are taking the medicine. Pregnancy If you are about to stop having your period (premenopausal) and you may become pregnant, seek counseling before you get  pregnant. Take 400 to 800 micrograms (mcg) of folic acid every day if you become pregnant. Ask for birth control (contraception) if you want to prevent pregnancy. Osteoporosis and menopause Osteoporosis is a disease in which the bones lose minerals and strength with aging. This can result in bone fractures. If you are 65 years old or older, or if you are at risk for osteoporosis and fractures, ask your health care provider if you should: Be screened for bone loss. Take a calcium or vitamin D supplement to lower your risk of fractures. Be given hormone replacement therapy (HRT) to treat symptoms of menopause. Follow these instructions at home: Lifestyle Do not use any products that contain nicotine or tobacco, such as cigarettes, e-cigarettes, and chewing tobacco. If you need help quitting, ask your health care provider. Do not use street drugs. Do not share needles. Ask your health care provider for help if you need support or information about quitting drugs. Alcohol use Do not drink alcohol if: Your health care provider tells you not to drink. You are pregnant, may be pregnant, or are planning to become pregnant. If you drink alcohol: Limit how much you use to 0-1 drink a day. Limit intake if you are breastfeeding. Be aware of how much alcohol is in your drink. In the [place] one drink equals one 12 oz bottle of beer (355 mL), one 5 oz glass of wine (148 mL), or one 1 oz glass of hard liquor (44 mL). General instructions Schedule regular health, dental, and eye exams. Stay current with your vaccines. Tell your health care provider if: You often feel depressed. You have ever been abused or do not feel safe at home. Summary Adopting a healthy lifestyle and getting preventive care are important in promoting health and wellness. Follow your health care provider's instructions about healthy diet, exercising, and getting tested or screened for diseases. Follow your health care provider's  instructions on monitoring your cholesterol and blood pressure. This information is not intended to replace advice given to you by your health care provider. Make sure you discuss any questions you have with your healthcare provider. Document Revised: 03/23/2018 Document Reviewed: 03/23/2018 Elsevier Patient Education  2022 Elsevier Inc. Managing Your Hypertension Hypertension, also called high blood pressure, is when the force of the blood pressing against the walls of the arteries is too strong. Arteries are blood vessels that carry blood from your heart throughout your body. Hypertension forces the heart to work harder to pump blood and may cause the arteries tobecome narrow or stiff. Understanding blood pressure readings Your personal target blood pressure may vary depending on your medical conditions, your age, and other factors. A blood pressure reading includes a higher number over a lower number. Ideally, your blood pressure should be below 120/80. You should know that: The first, or top, number is called the systolic pressure. It is a measure of the pressure in your arteries as your heart beats. The second, or bottom number, is called the diastolic pressure. It is a measure of the pressure in your arteries as the heart relaxes. Blood pressure is classified into four stages. Based on your blood pressure reading, your   health care provider may use the following stages to determine what type of treatment you need, if any. Systolic pressure and diastolicpressure are measured in a unit called mmHg. Normal Systolic pressure: below 120. Diastolic pressure: below 80. Elevated Systolic pressure: 120-129. Diastolic pressure: below 80. Hypertension stage 1 Systolic pressure: 130-139. Diastolic pressure: 80-89. Hypertension stage 2 Systolic pressure: 140 or above. Diastolic pressure: 90 or above. How can this condition affect me? Managing your hypertension is an important responsibility. Over  time, hypertension can damage the arteries and decrease blood flow to important parts of the body, including the brain, heart, and kidneys. Having untreated or uncontrolled hypertension can lead to: A heart attack. A stroke. A weakened blood vessel (aneurysm). Heart failure. Kidney damage. Eye damage. Metabolic syndrome. Memory and concentration problems. Vascular dementia. What actions can I take to manage this condition? Hypertension can be managed by making lifestyle changes and possibly by taking medicines. Your health care provider will help you make a plan to bring yourblood pressure within a normal range. Nutrition  Eat a diet that is high in fiber and potassium, and low in salt (sodium), added sugar, and fat. An example eating plan is called the Dietary Approaches to Stop Hypertension (DASH) diet. To eat this way: Eat plenty of fresh fruits and vegetables. Try to fill one-half of your plate at each meal with fruits and vegetables. Eat whole grains, such as whole-wheat pasta, brown rice, or whole-grain bread. Fill about one-fourth of your plate with whole grains. Eat low-fat dairy products. Avoid fatty cuts of meat, processed or cured meats, and poultry with skin. Fill about one-fourth of your plate with lean proteins such as fish, chicken without skin, beans, eggs, and tofu. Avoid pre-made and processed foods. These tend to be higher in sodium, added sugar, and fat. Reduce your daily sodium intake. Most people with hypertension should eat less than 1,500 mg of sodium a day.  Lifestyle  Work with your health care provider to maintain a healthy body weight or to lose weight. Ask what an ideal weight is for you. Get at least 30 minutes of exercise that causes your heart to beat faster (aerobic exercise) most days of the week. Activities may include walking, swimming, or biking. Include exercise to strengthen your muscles (resistance exercise), such as weight lifting, as part of your  weekly exercise routine. Try to do these types of exercises for 30 minutes at least 3 days a week. Do not use any products that contain nicotine or tobacco, such as cigarettes, e-cigarettes, and chewing tobacco. If you need help quitting, ask your health care provider. Control any long-term (chronic) conditions you have, such as high cholesterol or diabetes. Identify your sources of stress and find ways to manage stress. This may include meditation, deep breathing, or making time for fun activities.  Alcohol use Do not drink alcohol if: Your health care provider tells you not to drink. You are pregnant, may be pregnant, or are planning to become pregnant. If you drink alcohol: Limit how much you use to: 0-1 drink a day for women. 0-2 drinks a day for men. Be aware of how much alcohol is in your drink. In the [place] one drink equals one 12 oz bottle of beer (355 mL), one 5 oz glass of wine (148 mL), or one 1 oz glass of hard liquor (44 mL). Medicines Your health care provider may prescribe medicine if lifestyle changes are not enough to get your blood pressure under control and if: Your   systolic blood pressure is 130 or higher. Your diastolic blood pressure is 80 or higher. Take medicines only as told by your health care provider. Follow the directions carefully. Blood pressure medicines must be taken as told by your health care provider. The medicine does not work as well when you skip doses. Skippingdoses also puts you at risk for problems. Monitoring Before you monitor your blood pressure: Do not smoke, drink caffeinated beverages, or exercise within 30 minutes before taking a measurement. Use the bathroom and empty your bladder (urinate). Sit quietly for at least 5 minutes before taking measurements. Monitor your blood pressure at home as told by your health care provider. To do this: Sit with your back straight and supported. Place your feet flat on the floor. Do not cross your  legs. Support your arm on a flat surface, such as a table. Make sure your upper arm is at heart level. Each time you measure, take two or three readings one minute apart and record the results. You may also need to have your blood pressure checked regularly by your healthcare provider. General information Talk with your health care provider about your diet, exercise habits, and other lifestyle factors that may be contributing to hypertension. Review all the medicines you take with your health care provider because there may be side effects or interactions. Keep all visits as told by your health care provider. Your health care provider can help you create and adjust your plan for managing your high blood pressure. Where to find more information National Heart, Lung, and Blood Institute: www.nhlbi.nih.gov American Heart Association: www.heart.org Contact a health care provider if: You think you are having a reaction to medicines you have taken. You have repeated (recurrent) headaches. You feel dizzy. You have swelling in your ankles. You have trouble with your vision. Get help right away if: You develop a severe headache or confusion. You have unusual weakness or numbness, or you feel faint. You have severe pain in your chest or abdomen. You vomit repeatedly. You have trouble breathing. These symptoms may represent a serious problem that is an emergency. Do not wait to see if the symptoms will go away. Get medical help right away. Call your local emergency services (911 in the [place] Do not drive yourself to the hospital. Summary Hypertension is when the force of blood pumping through your arteries is too strong. If this condition is not controlled, it may put you at risk for serious complications. Your personal target blood pressure may vary depending on your medical conditions, your age, and other factors. For most people, a normal blood pressure is less than 120/80. Hypertension is  managed by lifestyle changes, medicines, or both. Lifestyle changes to help manage hypertension include losing weight, eating a healthy, low-sodium diet, exercising more, stopping smoking, and limiting alcohol. This information is not intended to replace advice given to you by your health care provider. Make sure you discuss any questions you have with your healthcare provider. Document Revised: 05/05/2019 Document Reviewed: 02/28/2019 Elsevier Patient Education  2022 Elsevier Inc.  

## 2020-12-05 NOTE — Progress Notes (Signed)
[facility] Patient [facility] [address] [place], [place]  [address] Phone:  [phone]   Fax:  [phone]   New Patient Office Visit  Subjective:  Patient ID: Tricia Bridges, female    DOB: Aug 14, 1965  Age: 55 y.o. MRN: [id number]  CC:  Chief Complaint  Patient presents with   Establish Care    A1C:09/17/2020 >15.5 Hand numbness, weakness since having stroke/seizure in June is in with Neurology    HPI Tricia Bridges presents to establish care. She  has a past medical history of Diabetes mellitus without complication (HCC), Hypertension, Pre-diabetes, Seizures (HCC), and Stroke (cerebrum) (HCC) (09/30/2020).   She is in today to establish care.  In June 2022 she was diagnosed with status epilepticus subacute CVA, hypoglycemic hyperosmolar nonketotic coma for 4 days, acute metabolic encephalopathy and nontraumatic rhabdomyolysis.  She continues to recover.  She is not exhibiting any paralysis or paresis.  She has follow-up with neurology.  She was encouraged to discontinue Plavix.   She reports long history of diabetes and hypertension.  According to her A1c 2 years ago it was controlled.  She reports that her blood pressure has been uncontrolled for many years.    She moved here from [place] several years ago.  She is currently working full-time at [employer] third shift.  She has noted some limitations with her ability to maintain the entire shift.  This is due to the type of work involved. She has applied for disability meeting on 12/27/20.  She is currently on Lantus 20 units nightly along with metformin.  She is monitoring her CBG regularly.  She has made some changes in her diet and feels like this has been effective.  Her blood sugar ranges 100s to 300s with a marked improvement over the last few weeks consistently in the 200s.  She reports indicates that her blood pressure has been fluctuating with many readings elevated 160s to 170s/over 100s.  She does report  twinges in the top of her head.  She denies any headaches, dizziness, shortness of breath, chest pains, swelling in the legs feet or ankles.  Past Medical History:  Diagnosis Date   Diabetes mellitus without complication (HCC)    Hypertension    Pre-diabetes    Seizures (HCC)    Stroke (cerebrum) (HCC) 09/30/2020    Past Surgical History:  Procedure Laterality Date   CESAREAN SECTION      Family History  Problem Relation Age of Onset   Hypertension Mother    COPD Mother        Smoker   Alcohol abuse Father    Hypertension Sister     Social History   Socioeconomic History   Marital status: Single    Spouse name: Not on file   Number of children: Not on file   Years of education: Not on file   Highest education level: Not on file  Occupational History   Not on file  Tobacco Use   Smoking status: Never   Smokeless tobacco: Never  Vaping Use   Vaping Use: Never used  Substance and Sexual Activity   Alcohol use: No   Drug use: No   Sexual activity: Not Currently  Other Topics Concern   Not on file  Social History Narrative   Lives with daughter   Right Handed   Drinks caffeine occassionally   Social Determinants of Health   Financial Resource Strain: Not on file  Food Insecurity: Not on file  Transportation Needs: Not on file  Physical Activity: Not on file  Stress: Not on file  Social Connections: Not on file  Intimate Partner Violence: Not on file    ROS Review of Systems  Objective:   Today's Vitals: BP 136/81   Pulse 68   Temp (!) 97.3 F (36.3 C)   Ht 5\' 4"  (1.626 m)   Wt 147 lb 0.8 oz (66.7 kg)   SpO2 98%   BMI 25.24 kg/m   Physical Exam Constitutional:      Appearance: She is normal weight.  HENT:     Head: Normocephalic and atraumatic.     Right Ear: Tympanic membrane normal.     Left Ear: Tympanic membrane normal.     Nose: Nose normal.     Mouth/Throat:     Mouth: Mucous membranes are moist.  Eyes:     Pupils: Pupils are  equal, round, and reactive to light.  Cardiovascular:     Rate and Rhythm: Normal rate and regular rhythm.     Pulses: Normal pulses.     Heart sounds: Normal heart sounds.  Pulmonary:     Effort: Pulmonary effort is normal.     Breath sounds: Normal breath sounds.  Abdominal:     General: Bowel sounds are normal. There is no distension.     Palpations: Abdomen is soft. There is no mass.     Tenderness: There is no abdominal tenderness.  Musculoskeletal:        General: Normal range of motion.     Cervical back: Normal range of motion and neck supple. No rigidity or tenderness.  Lymphadenopathy:     Cervical: No cervical adenopathy.  Skin:    General: Skin is warm and dry.     Capillary Refill: Capillary refill takes less than 2 seconds.  Neurological:     General: No focal deficit present.     Mental Status: She is alert and oriented to person, place, and time.  Psychiatric:        Mood and Affect: Mood normal.        Behavior: Behavior normal.        Thought Content: Thought content normal.        Judgment: Judgment normal.    Assessment & Plan:   Problem List Items Addressed This Visit       Cardiovascular and Mediastinum   Essential hypertension - Primary Persistent Increase amlodipine 10 mg daily Encouraged on going compliance with current medication regimen Encouraged home monitoring and recording BP <130/80 Eating a heart-healthy diet with less salt Encouraged regular physical activity     Relevant Medications   atorvastatin (LIPITOR) 40 MG tablet   enalapril (VASOTEC) 10 MG tablet   amLODipine (NORVASC) 10 MG tablet   Other Relevant Orders   Urinalysis Dipstick (Completed)   Stroke (cerebrum) (HCC) Stable   Relevant Medications   atorvastatin (LIPITOR) 40 MG tablet   enalapril (VASOTEC) 10 MG tablet   amLODipine (NORVASC) 10 MG tablet     Endocrine   Type 2 diabetes mellitus without complication, with long-term current use of insulin (HCC) Improving  A1c 9.1% down from 15.5% Encourage compliance with current treatment regimen  Encourage regular CBG monitoring Encourage contacting office if excessive hyperglycemia and or hypoglycemia Lifestyle modification with healthy diet (fewer calories, more high fiber foods, whole grains and non-starchy vegetables, lower fat meat and fish, low-fat diary include healthy oils) regular exercise (physical activity) and weight loss Opthalmology exam discussed  Nutritional  consult recommended Regular dental visits encouraged Home BP monitoring also encouraged goal <130/80    Relevant Medications   insulin glargine (LANTUS SOLOSTAR) 100 UNIT/ML Solostar Pen   atorvastatin (LIPITOR) 40 MG tablet   enalapril (VASOTEC) 10 MG tablet   metFORMIN (GLUCOPHAGE-XR) 750 MG 24 hr tablet   Other Relevant Orders   Urinalysis Dipstick (Completed)     Nervous and Auditory   Status epilepticus (HCC)     Other   Mixed hyperlipidemia   Relevant Medications   atorvastatin (LIPITOR) 40 MG tablet   enalapril (VASOTEC) 10 MG tablet   amLODipine (NORVASC) 10 MG tablet   Other Visit Diagnoses     Hypertensive urgency     Improved with in office clonidine Dose adjustments to amlodipine   Relevant Medications   atorvastatin (LIPITOR) 40 MG tablet   enalapril (VASOTEC) 10 MG tablet   amLODipine (NORVASC) 10 MG tablet   cloNIDine (CATAPRES) tablet 0.2 mg (Completed)       Outpatient Encounter Medications as of 12/05/2020  Medication Sig   amLODipine (NORVASC) 10 MG tablet Take 1 tablet (10 mg total) by mouth daily.   aspirin EC 81 MG tablet Take 1 tablet (81 mg total) by mouth daily.   Cholecalciferol (VITAMIN D PO) Take 1 tablet by mouth daily.   clopidogrel (PLAVIX) 75 MG tablet Take 1 tablet (75 mg total) by mouth daily.   fluticasone (FLONASE) 50 MCG/ACT nasal spray Place 2 sprays into both nostrils daily.   glucose blood test strip Test blood glucose twice daily. Contour next strips. DX E11.56   Insulin Pen  Needle (PEN NEEDLES 31GX5/16") 31G X 8 MM MISC Use as directed   levETIRAcetam (KEPPRA) 500 MG tablet Take 1 tablet (500 mg total) by mouth 2 (two) times daily.   Vitamin E 400 units TABS Take 1 tablet (400 Units total) by mouth daily.   [DISCONTINUED] amLODipine (NORVASC) 5 MG tablet TAKE 1 TABLET(5 MG) BY MOUTH DAILY   [DISCONTINUED] atorvastatin (LIPITOR) 40 MG tablet Take 1 tablet (40 mg total) by mouth daily.   [DISCONTINUED] cephALEXin (KEFLEX) 500 MG capsule Take 1 capsule (500 mg total) by mouth 4 (four) times daily.   [DISCONTINUED] enalapril (VASOTEC) 10 MG tablet TAKE 1 TABLET(10 MG) BY MOUTH DAILY   [DISCONTINUED] insulin glargine (LANTUS SOLOSTAR) 100 UNIT/ML Solostar Pen Inject 20 Units into the skin at bedtime. Start tomorrow night   [DISCONTINUED] metFORMIN (GLUCOPHAGE-XR) 750 MG 24 hr tablet Take 1 tablet (750 mg total) by mouth daily with breakfast.   atorvastatin (LIPITOR) 40 MG tablet Take 1 tablet (40 mg total) by mouth daily.   enalapril (VASOTEC) 10 MG tablet Take 1 tablet (10 mg total) by mouth daily. TAKE 1 TABLET(10 MG) BY MOUTH DAILY   insulin glargine (LANTUS SOLOSTAR) 100 UNIT/ML Solostar Pen Inject 20 Units into the skin at bedtime. Start tomorrow night   metFORMIN (GLUCOPHAGE-XR) 750 MG 24 hr tablet Take 1 tablet (750 mg total) by mouth daily with breakfast.   [EXPIRED] cloNIDine (CATAPRES) tablet 0.2 mg    No facility-administered encounter medications on file as of 12/05/2020.    Follow-up: Return in about 6 weeks (around 01/16/2021) for Follow up HTN [id number].   Barbette Merino, NP

## 2020-12-14 ENCOUNTER — Other Ambulatory Visit: Payer: Self-pay | Admitting: Nurse Practitioner

## 2020-12-19 ENCOUNTER — Other Ambulatory Visit: Payer: Self-pay | Admitting: Nurse Practitioner

## 2020-12-31 ENCOUNTER — Other Ambulatory Visit: Payer: Self-pay

## 2020-12-31 DIAGNOSIS — Z794 Long term (current) use of insulin: Secondary | ICD-10-CM

## 2020-12-31 DIAGNOSIS — E119 Type 2 diabetes mellitus without complications: Secondary | ICD-10-CM

## 2020-12-31 MED ORDER — LANTUS SOLOSTAR 100 UNIT/ML ~~LOC~~ SOPN
20.0000 [IU] | PEN_INJECTOR | Freq: Every day | SUBCUTANEOUS | 3 refills | Status: DC
Start: 2020-12-31 — End: 2021-01-13

## 2021-01-10 ENCOUNTER — Telehealth: Payer: Self-pay

## 2021-01-13 ENCOUNTER — Other Ambulatory Visit: Payer: Self-pay | Admitting: Nurse Practitioner

## 2021-01-13 DIAGNOSIS — E119 Type 2 diabetes mellitus without complications: Secondary | ICD-10-CM

## 2021-01-13 DIAGNOSIS — Z794 Long term (current) use of insulin: Secondary | ICD-10-CM

## 2021-01-13 MED ORDER — LANTUS SOLOSTAR 100 UNIT/ML ~~LOC~~ SOPN: 20.0000 [IU] | PEN_INJECTOR | Freq: Every day | SUBCUTANEOUS | 3 refills | Status: DC

## 2021-01-13 NOTE — Telephone Encounter (Signed)
No additional notes needed  

## 2021-01-27 ENCOUNTER — Other Ambulatory Visit: Payer: Self-pay

## 2021-01-27 ENCOUNTER — Ambulatory Visit (INDEPENDENT_AMBULATORY_CARE_PROVIDER_SITE_OTHER): Payer: 59 | Admitting: Nurse Practitioner

## 2021-01-27 VITALS — BP 143/98 | HR 88 | Temp 97.5°F | Ht 64.0 in | Wt 150.0 lb

## 2021-01-27 DIAGNOSIS — Z794 Long term (current) use of insulin: Secondary | ICD-10-CM | POA: Diagnosis not present

## 2021-01-27 DIAGNOSIS — I10 Essential (primary) hypertension: Secondary | ICD-10-CM | POA: Diagnosis not present

## 2021-01-27 DIAGNOSIS — E782 Mixed hyperlipidemia: Secondary | ICD-10-CM | POA: Diagnosis not present

## 2021-01-27 DIAGNOSIS — E119 Type 2 diabetes mellitus without complications: Secondary | ICD-10-CM | POA: Diagnosis not present

## 2021-01-27 LAB — POCT GLYCOSYLATED HEMOGLOBIN (HGB A1C)
HbA1c POC (<> result, manual entry): 11 % (ref 4.0–5.6)
HbA1c, POC (controlled diabetic range): 11 % — AB (ref 0.0–7.0)
HbA1c, POC (prediabetic range): 11 % — AB (ref 5.7–6.4)
Hemoglobin A1C: 11 % — AB (ref 4.0–5.6)

## 2021-01-27 LAB — GLUCOSE, POCT (MANUAL RESULT ENTRY): POC Glucose: 378 mg/dl — AB (ref 70–99)

## 2021-01-27 MED ORDER — ENALAPRIL MALEATE 20 MG PO TABS: 20.0000 mg | ORAL_TABLET | Freq: Every day | ORAL | 3 refills | Status: DC

## 2021-01-27 MED ORDER — LANTUS SOLOSTAR 100 UNIT/ML ~~LOC~~ SOPN: 20.0000 [IU] | PEN_INJECTOR | Freq: Two times a day (BID) | SUBCUTANEOUS | 3 refills | Status: DC

## 2021-01-27 NOTE — Patient Instructions (Addendum)
Diabetes Mellitus and Nutrition, Adult When you have diabetes, or diabetes mellitus, it is very important to have healthy eating habits because your blood sugar (glucose) levels are greatly affected by what you eat and drink. Eating healthy foods in the right amounts, at about the same times every day, can help you: Control your blood glucose. Lower your risk of heart disease. Improve your blood pressure. Reach or maintain a healthy weight. What can affect my meal plan? Every person with diabetes is different, and each person has different needs for a meal plan. Your health care provider may recommend that you work with a dietitian to make a meal plan that is best for you. Your meal plan may vary depending on factors such as: The calories you need. The medicines you take. Your weight. Your blood glucose, blood pressure, and cholesterol levels. Your activity level. Other health conditions you have, such as heart or kidney disease. How do carbohydrates affect me? Carbohydrates, also called carbs, affect your blood glucose level more than any other type of food. Eating carbs naturally raises the amount of glucose in your blood. Carb counting is a method for keeping track of how many carbs you eat. Counting carbs is important to keep your blood glucose at a healthy level,especially if you use insulin or take certain oral diabetes medicines. It is important to know how many carbs you can safely have in each meal. This is different for every person. Your dietitian can help you calculate how manycarbs you should have at each meal and for each snack. How does alcohol affect me? Alcohol can cause a sudden decrease in blood glucose (hypoglycemia), especially if you use insulin or take certain oral diabetes medicines. Hypoglycemia can be a life-threatening condition. Symptoms of hypoglycemia, such as sleepiness, dizziness, and confusion, are similar to symptoms of having too much alcohol. Do not drink  alcohol if: Your health care provider tells you not to drink. You are pregnant, may be pregnant, or are planning to become pregnant. If you drink alcohol: Do not drink on an empty stomach. Limit how much you use to: 0-1 drink a day for women. 0-2 drinks a day for men. Be aware of how much alcohol is in your drink. In the [place] one drink equals one 12 oz bottle of beer (355 mL), one 5 oz glass of wine (148 mL), or one 1 oz glass of hard liquor (44 mL). Keep yourself hydrated with water, diet soda, or unsweetened iced tea. Keep in mind that regular soda, juice, and other mixers may contain a lot of sugar and must be counted as carbs. What are tips for following this plan?  Reading food labels Start by checking the serving size on the "Nutrition Facts" label of packaged foods and drinks. The amount of calories, carbs, fats, and other nutrients listed on the label is based on one serving of the item. Many items contain more than one serving per package. Check the total grams (g) of carbs in one serving. You can calculate the number of servings of carbs in one serving by dividing the total carbs by 15. For example, if a food has 30 g of total carbs per serving, it would be equal to 2 servings of carbs. Check the number of grams (g) of saturated fats and trans fats in one serving. Choose foods that have a low amount or none of these fats. Check the number of milligrams (mg) of salt (sodium) in one serving. Most people should limit total   sodium intake to less than 2,300 mg per day. Always check the nutrition information of foods labeled as "low-fat" or "nonfat." These foods may be higher in added sugar or refined carbs and should be avoided. Talk to your dietitian to identify your daily goals for nutrients listed on the label. Shopping Avoid buying canned, pre-made, or processed foods. These foods tend to be high in fat, sodium, and added sugar. Shop around the outside edge of the grocery store. This  is where you will most often find fresh fruits and vegetables, bulk grains, fresh meats, and fresh dairy. Cooking Use low-heat cooking methods, such as baking, instead of high-heat cooking methods like deep frying. Cook using healthy oils, such as olive, canola, or sunflower oil. Avoid cooking with butter, cream, or high-fat meats. Meal planning Eat meals and snacks regularly, preferably at the same times every day. Avoid going long periods of time without eating. Eat foods that are high in fiber, such as fresh fruits, vegetables, beans, and whole grains. Talk with your dietitian about how many servings of carbs you can eat at each meal. Eat 4-6 oz (112-168 g) of lean protein each day, such as lean meat, chicken, fish, eggs, or tofu. One ounce (oz) of lean protein is equal to: 1 oz (28 g) of meat, chicken, or fish. 1 egg.  cup (62 g) of tofu. Eat some foods each day that contain healthy fats, such as avocado, nuts, seeds, and fish. What foods should I eat? Fruits Berries. Apples. Oranges. Peaches. Apricots. Plums. Grapes. Mango. Papaya.Pomegranate. Kiwi. Cherries. Vegetables Lettuce. Spinach. Leafy greens, including kale, chard, collard greens, and mustard greens. Beets. Cauliflower. Cabbage. Broccoli. Carrots. Green beans.Tomatoes. Peppers. Onions. Cucumbers. Brussels sprouts. Grains Whole grains, such as whole-wheat or whole-grain bread, crackers, tortillas,cereal, and pasta. Unsweetened oatmeal. Quinoa. Brown or wild rice. Meats and other proteins Seafood. Poultry without skin. Lean cuts of poultry and beef. Tofu. Nuts. Seeds. Dairy Low-fat or fat-free dairy products such as milk, yogurt, and cheese. The items listed above may not be a complete list of foods and beverages you can eat. Contact a dietitian for more information. What foods should I avoid? Fruits Fruits canned with syrup. Vegetables Canned vegetables. Frozen vegetables with butter or cream sauce. Grains Refined white  flour and flour products such as bread, pasta, snack foods, andcereals. Avoid all processed foods. Meats and other proteins Fatty cuts of meat. Poultry with skin. Breaded or fried meats. Processed meat.Avoid saturated fats. Dairy Full-fat yogurt, cheese, or milk. Beverages Sweetened drinks, such as soda or iced tea. The items listed above may not be a complete list of foods and beverages you should avoid. Contact a dietitian for more information. Questions to ask a health care provider Do I need to meet with a diabetes educator? Do I need to meet with a dietitian? What number can I call if I have questions? When are the best times to check my blood glucose? Where to find more information: American Diabetes Association: diabetes.org Academy of Nutrition and Dietetics: www.eatright.org National Institute of Diabetes and Digestive and Kidney Diseases: www.niddk.nih.gov Association of Diabetes Care and Education Specialists: www.diabeteseducator.org Summary It is important to have healthy eating habits because your blood sugar (glucose) levels are greatly affected by what you eat and drink. A healthy meal plan will help you control your blood glucose and maintain a healthy lifestyle. Your health care provider may recommend that you work with a dietitian to make a meal plan that is best for you. Keep in   mind that carbohydrates (carbs) and alcohol have immediate effects on your blood glucose levels. It is important to count carbs and to use alcohol carefully. This information is not intended to replace advice given to you by your health care provider. Make sure you discuss any questions you have with your healthcare provider. Document Revised: 03/07/2019 Document Reviewed: 03/07/2019 Elsevier Patient Education  2021 [employer]  Managing Your Hypertension Hypertension, also called high blood pressure, is when the force of the blood pressing against the walls of the arteries is too strong.  Arteries are blood vessels that carry blood from your heart throughout your body. Hypertension forces the heart to work harder to pump blood and may cause the arteries tobecome narrow or stiff. Understanding blood pressure readings Your personal target blood pressure may vary depending on your medical conditions, your age, and other factors. A blood pressure reading includes a higher number over a lower number. Ideally, your blood pressure should be below 120/80. You should know that: The first, or top, number is called the systolic pressure. It is a measure of the pressure in your arteries as your heart beats. The second, or bottom number, is called the diastolic pressure. It is a measure of the pressure in your arteries as the heart relaxes. Blood pressure is classified into four stages. Based on your blood pressure reading, your health care provider may use the following stages to determine what type of treatment you need, if any. Systolic pressure and diastolicpressure are measured in a unit called mmHg. Normal Systolic pressure: below 120. Diastolic pressure: below 80. Elevated Systolic pressure: 120-129. Diastolic pressure: below 80. Hypertension stage 1 Systolic pressure: 130-139. Diastolic pressure: 80-89. Hypertension stage 2 Systolic pressure: 140 or above. Diastolic pressure: 90 or above. How can this condition affect me? Managing your hypertension is an important responsibility. Over time, hypertension can damage the arteries and decrease blood flow to important parts of the body, including the brain, heart, and kidneys. Having untreated or uncontrolled hypertension can lead to: A heart attack. A stroke. A weakened blood vessel (aneurysm). Heart failure. Kidney damage. Eye damage. Metabolic syndrome. Memory and concentration problems. Vascular dementia. What actions can I take to manage this condition? Hypertension can be managed by making lifestyle changes and possibly by  taking medicines. Your health care provider will help you make a plan to bring yourblood pressure within a normal range. Nutrition  Eat a diet that is high in fiber and potassium, and low in salt (sodium), added sugar, and fat. An example eating plan is called the Dietary Approaches to Stop Hypertension (DASH) diet. To eat this way: Eat plenty of fresh fruits and vegetables. Try to fill one-half of your plate at each meal with fruits and vegetables. Eat whole grains, such as whole-wheat pasta, brown rice, or whole-grain bread. Fill about one-fourth of your plate with whole grains. Eat low-fat dairy products. Avoid fatty cuts of meat, processed or cured meats, and poultry with skin. Fill about one-fourth of your plate with lean proteins such as fish, chicken without skin, beans, eggs, and tofu. Avoid pre-made and processed foods. These tend to be higher in sodium, added sugar, and fat. Reduce your daily sodium intake. Most people with hypertension should eat less than 1,500 mg of sodium a day.  Lifestyle  Work with your health care provider to maintain a healthy body weight or to lose weight. Ask what an ideal weight is for you. Get at least 30 minutes of exercise that causes your heart   to beat faster (aerobic exercise) most days of the week. Activities may include walking, swimming, or biking. Include exercise to strengthen your muscles (resistance exercise), such as weight lifting, as part of your weekly exercise routine. Try to do these types of exercises for 30 minutes at least 3 days a week. Do not use any products that contain nicotine or tobacco, such as cigarettes, e-cigarettes, and chewing tobacco. If you need help quitting, ask your health care provider. Control any long-term (chronic) conditions you have, such as high cholesterol or diabetes. Identify your sources of stress and find ways to manage stress. This may include meditation, deep breathing, or making time for fun  activities.  Alcohol use Do not drink alcohol if: Your health care provider tells you not to drink. You are pregnant, may be pregnant, or are planning to become pregnant. If you drink alcohol: Limit how much you use to: 0-1 drink a day for women. 0-2 drinks a day for men. Be aware of how much alcohol is in your drink. In the [place] one drink equals one 12 oz bottle of beer (355 mL), one 5 oz glass of wine (148 mL), or one 1 oz glass of hard liquor (44 mL). Medicines Your health care provider may prescribe medicine if lifestyle changes are not enough to get your blood pressure under control and if: Your systolic blood pressure is 130 or higher. Your diastolic blood pressure is 80 or higher. Take medicines only as told by your health care provider. Follow the directions carefully. Blood pressure medicines must be taken as told by your health care provider. The medicine does not work as well when you skip doses. Skippingdoses also puts you at risk for problems. Monitoring Before you monitor your blood pressure: Do not smoke, drink caffeinated beverages, or exercise within 30 minutes before taking a measurement. Use the bathroom and empty your bladder (urinate). Sit quietly for at least 5 minutes before taking measurements. Monitor your blood pressure at home as told by your health care provider. To do this: Sit with your back straight and supported. Place your feet flat on the floor. Do not cross your legs. Support your arm on a flat surface, such as a table. Make sure your upper arm is at heart level. Each time you measure, take two or three readings one minute apart and record the results. You may also need to have your blood pressure checked regularly by your healthcare provider. General information Talk with your health care provider about your diet, exercise habits, and other lifestyle factors that may be contributing to hypertension. Review all the medicines you take with your health  care provider because there may be side effects or interactions. Keep all visits as told by your health care provider. Your health care provider can help you create and adjust your plan for managing your high blood pressure. Where to find more information National Heart, Lung, and Blood Institute: www.nhlbi.nih.gov American Heart Association: www.heart.org Contact a health care provider if: You think you are having a reaction to medicines you have taken. You have repeated (recurrent) headaches. You feel dizzy. You have swelling in your ankles. You have trouble with your vision. Get help right away if: You develop a severe headache or confusion. You have unusual weakness or numbness, or you feel faint. You have severe pain in your chest or abdomen. You vomit repeatedly. You have trouble breathing. These symptoms may represent a serious problem that is an emergency. Do not wait to see if   the symptoms will go away. Get medical help right away. Call your local emergency services (911 in the [place] Do not drive yourself to the hospital. Summary Hypertension is when the force of blood pumping through your arteries is too strong. If this condition is not controlled, it may put you at risk for serious complications. Your personal target blood pressure may vary depending on your medical conditions, your age, and other factors. For most people, a normal blood pressure is less than 120/80. Hypertension is managed by lifestyle changes, medicines, or both. Lifestyle changes to help manage hypertension include losing weight, eating a healthy, low-sodium diet, exercising more, stopping smoking, and limiting alcohol. This information is not intended to replace advice given to you by your health care provider. Make sure you discuss any questions you have with your healthcare provider. Document Revised: 05/05/2019 Document Reviewed: 02/28/2019 Elsevier Patient Education  2022 [employer]  

## 2021-01-27 NOTE — Progress Notes (Signed)
[facility] Patient [facility] [address] [place], [place]  [address] Phone:  [phone]   Fax:  [phone]   Established Patient Office Visit  Subjective:  Patient ID: Tricia Bridges, female    DOB: March 14, 1966  Age: 55 y.o. MRN: [id number]  CC:  Chief Complaint  Patient presents with   Follow-up    6 week follow up, no questions or concerns    HPI Tricia Bridges presents for follow up. She  has a past medical history of Diabetes mellitus without complication (HCC), Hypertension, Pre-diabetes, Seizures (HCC), and Stroke (cerebrum) (HCC) (09/30/2020).    Diabetes Mellitus Patient presents for follow up of diabetes. Current symptoms include: hyperglycemia. Symptoms have stabilized. She reports that she is feeling well. She was able to travel to visit her mother in [place]. Patient denies foot ulcerations, hypoglycemia , increased appetite, nausea, polydipsia, polyuria, and vomiting. Evaluation to date has included: hemoglobin A1C.  . Current treatment: Continued insulin which has been not very effective, Continued metformin which has been not very effective, Continued statin which has been unable to assess effectiveness, and Continued ACE inhibitor/ARB which has been not very effective.   Past Medical History:  Diagnosis Date   Diabetes mellitus without complication (HCC)    Hypertension    Pre-diabetes    Seizures (HCC)    Stroke (cerebrum) (HCC) 09/30/2020    Past Surgical History:  Procedure Laterality Date   CESAREAN SECTION      Family History  Problem Relation Age of Onset   Hypertension Mother    COPD Mother        Smoker   Alcohol abuse Father    Hypertension Sister     Social History   Socioeconomic History   Marital status: Single    Spouse name: Not on file   Number of children: Not on file   Years of education: Not on file   Highest education level: Not on file  Occupational History   Not on file  Tobacco Use   Smoking status: Never   Smokeless  tobacco: Never  Vaping Use   Vaping Use: Never used  Substance and Sexual Activity   Alcohol use: No   Drug use: No   Sexual activity: Not Currently  Other Topics Concern   Not on file  Social History Narrative   Lives with daughter   Right Handed   Drinks caffeine occassionally   Social Determinants of Health   Financial Resource Strain: Not on file  Food Insecurity: Not on file  Transportation Needs: Not on file  Physical Activity: Not on file  Stress: Not on file  Social Connections: Not on file  Intimate Partner Violence: Not on file    Outpatient Medications Prior to Visit  Medication Sig Dispense Refill   amLODipine (NORVASC) 10 MG tablet Take 1 tablet (10 mg total) by mouth daily. 90 tablet 3   aspirin EC 81 MG tablet Take 1 tablet (81 mg total) by mouth daily. 90 tablet 3   atorvastatin (LIPITOR) 40 MG tablet Take 1 tablet (40 mg total) by mouth daily. 90 tablet 3   Cholecalciferol (VITAMIN D PO) Take 1 tablet by mouth daily.     fluticasone (FLONASE) 50 MCG/ACT nasal spray Place 2 sprays into both nostrils daily. 16 g 6   glucose blood test strip Test blood glucose twice daily. Contour next strips. DX E11.56 100 each prn   Insulin Pen Needle (PEN NEEDLES 31GX5/16") 31G X 8 MM MISC Use as  directed 100 each 2   levETIRAcetam (KEPPRA) 500 MG tablet Take 1 tablet (500 mg total) by mouth 2 (two) times daily. 60 tablet 0   metFORMIN (GLUCOPHAGE-XR) 750 MG 24 hr tablet Take 1 tablet (750 mg total) by mouth daily with breakfast. 90 tablet 3   clopidogrel (PLAVIX) 75 MG tablet Take 1 tablet (75 mg total) by mouth daily. 21 tablet 1   enalapril (VASOTEC) 10 MG tablet Take 1 tablet (10 mg total) by mouth daily. TAKE 1 TABLET(10 MG) BY MOUTH DAILY 90 tablet 3   insulin glargine (LANTUS SOLOSTAR) 100 UNIT/ML Solostar Pen Inject 20 Units into the skin at bedtime. Start tomorrow night 18 mL 3   Vitamin E 400 units TABS Take 1 tablet (400 Units total) by mouth daily. 90 tablet 3    No facility-administered medications prior to visit.    Allergies  Allergen Reactions   Penicillins Itching    Has patient had a PCN reaction causing immediate rash, facial/tongue/throat swelling, SOB or lightheadedness with hypotension: no Has patient had a PCN reaction causing severe rash involving mucus membranes or skin necrosis: no Has patient had a PCN reaction that required hospitalization: no Has patient had a PCN reaction occurring within the last 10 years: yes If all of the above answers are "NO", then may proceed with Cephalosporin use.     ROS Review of Systems    Objective:    Physical Exam Constitutional:      Appearance: She is normal weight.  HENT:     Head: Normocephalic and atraumatic.     Right Ear: Tympanic membrane normal.     Left Ear: Tympanic membrane normal.     Nose: Nose normal.     Mouth/Throat:     Mouth: Mucous membranes are moist.  Eyes:     Pupils: Pupils are equal, round, and reactive to light.  Cardiovascular:     Rate and Rhythm: Normal rate and regular rhythm.     Pulses: Normal pulses.     Heart sounds: Normal heart sounds.  Pulmonary:     Effort: Pulmonary effort is normal.     Breath sounds: Normal breath sounds.  Abdominal:     General: Bowel sounds are normal. There is no distension.     Palpations: Abdomen is soft. There is no mass.     Tenderness: There is no abdominal tenderness.  Musculoskeletal:        General: Normal range of motion.     Cervical back: Normal range of motion and neck supple. No rigidity or tenderness.  Lymphadenopathy:     Cervical: No cervical adenopathy.  Skin:    General: Skin is warm and dry.     Capillary Refill: Capillary refill takes less than 2 seconds.  Neurological:     General: No focal deficit present.     Mental Status: She is alert and oriented to person, place, and time.  Psychiatric:        Mood and Affect: Mood normal.        Behavior: Behavior normal.        Thought Content:  Thought content normal.        Judgment: Judgment normal.    BP (!) 143/98   Pulse 88   Temp (!) 97.5 F (36.4 C)   Ht 5\' 4"  (1.626 m)   Wt 150 lb (68 kg)   SpO2 98%   BMI 25.75 kg/m  Wt Readings from Last 3 Encounters:  01/27/21 150 lb (  68 kg)  12/05/20 147 lb 0.8 oz (66.7 kg)  11/17/20 156 lb 8.4 oz (71 kg)     Health Maintenance Due  Topic Date Due   MAMMOGRAM  08/14/2015   OPHTHALMOLOGY EXAM  08/14/2015   Pneumococcal Vaccine 100-42 Years old (2 - PCV) 08/14/2018   PAP SMEAR-Modifier  09/24/2020    There are no preventive care reminders to display for this patient.  Lab Results  Component Value Date   TSH 0.59 08/13/2017   Lab Results  Component Value Date   WBC 11.4 (H) 11/17/2020   HGB 13.9 11/17/2020   HCT 41.0 11/17/2020   MCV 88.4 11/17/2020   PLT 203 11/17/2020   Lab Results  Component Value Date   NA 137 11/17/2020   K 4.0 11/17/2020   CO2 24 11/17/2020   GLUCOSE 287 (H) 11/17/2020   BUN 15 11/17/2020   CREATININE 0.92 11/17/2020   BILITOT 0.8 11/17/2020   ALKPHOS 98 11/17/2020   AST 29 11/17/2020   ALT 28 11/17/2020   PROT 7.4 11/17/2020   ALBUMIN 3.5 11/17/2020   CALCIUM 9.2 11/17/2020   ANIONGAP 12 11/17/2020   GFR 89.52 02/25/2018   Lab Results  Component Value Date   CHOL 245 (H) 09/19/2020   Lab Results  Component Value Date   HDL 47 09/19/2020   Lab Results  Component Value Date   LDLCALC 179 (H) 09/19/2020   Lab Results  Component Value Date   TRIG 97 09/19/2020   Lab Results  Component Value Date   CHOLHDL 5.2 09/19/2020   Lab Results  Component Value Date   HGBA1C 11.0 (A) 01/27/2021   HGBA1C 11.0 01/27/2021   HGBA1C 11.0 (A) 01/27/2021   HGBA1C 11.0 (A) 01/27/2021      Assessment & Plan:   Problem List Items Addressed This Visit       Cardiovascular and Mediastinum   Essential hypertension Increased Enalapril 20 mg daily   Relevant Medications   enalapril (VASOTEC) 20 MG tablet     Endocrine    Type 2 diabetes mellitus without complication, with long-term current use of insulin (HCC) - Primary Increased Lantus 20 units BID  Encourage regular CBG monitoring Encourage contacting office if excessive hyperglycemia and or hypoglycemia Lifestyle modification with healthy diet (fewer calories, more high fiber foods, whole grains and non-starchy vegetables, lower fat meat and fish, low-fat diary include healthy oils) regular exercise (physical activity)  Home BP monitoring also encouraged goal <130/80    Relevant Medications   insulin glargine (LANTUS SOLOSTAR) 100 UNIT/ML Solostar Pen   enalapril (VASOTEC) 20 MG tablet   Other Relevant Orders   HgB A1c (Completed)   Glucose (CBG) (Completed)     Other   Mixed hyperlipidemia   Relevant Medications   enalapril (VASOTEC) 20 MG tablet    Meds ordered this encounter  Medications   insulin glargine (LANTUS SOLOSTAR) 100 UNIT/ML Solostar Pen    Sig: Inject 20 Units into the skin 2 (two) times daily.    Dispense:  36 mL    Refill:  3    Order Specific Question:   Supervising Provider    Answer:   Quentin Angst [[id number]]   enalapril (VASOTEC) 20 MG tablet    Sig: Take 1 tablet (20 mg total) by mouth daily.    Dispense:  90 tablet    Refill:  3    Order Specific Question:   Supervising Provider    Answer:   Quentin Angst [id number]  Follow-up: Return in about 4 weeks (around 02/24/2021) for Physcial VISIT,EST,40-64 [[id number]] discuss med changes.    Barbette Merino, NP

## 2021-01-31 ENCOUNTER — Encounter: Payer: Self-pay | Admitting: Nurse Practitioner

## 2021-02-10 ENCOUNTER — Ambulatory Visit
Admission: RE | Admit: 2021-02-10 | Discharge: 2021-02-10 | Disposition: A | Discharge status: Home / Self Care | Payer: 59 | Source: Ambulatory Visit | Attending: Nurse Practitioner | Admitting: Nurse Practitioner

## 2021-02-10 ENCOUNTER — Other Ambulatory Visit: Payer: Self-pay

## 2021-02-10 DIAGNOSIS — Z1231 Encounter for screening mammogram for malignant neoplasm of breast: Secondary | ICD-10-CM

## 2021-02-20 ENCOUNTER — Ambulatory Visit (INDEPENDENT_AMBULATORY_CARE_PROVIDER_SITE_OTHER): Payer: 59 | Admitting: Nurse Practitioner

## 2021-02-20 ENCOUNTER — Other Ambulatory Visit: Payer: Self-pay

## 2021-02-20 ENCOUNTER — Encounter: Payer: Self-pay | Admitting: Nurse Practitioner

## 2021-02-20 VITALS — BP 159/99 | HR 86 | Temp 97.1°F | Ht 64.0 in | Wt 150.0 lb

## 2021-02-20 DIAGNOSIS — Z1239 Encounter for other screening for malignant neoplasm of breast: Secondary | ICD-10-CM

## 2021-02-20 DIAGNOSIS — I10 Essential (primary) hypertension: Secondary | ICD-10-CM

## 2021-02-20 DIAGNOSIS — Z01419 Encounter for gynecological examination (general) (routine) without abnormal findings: Secondary | ICD-10-CM

## 2021-02-20 NOTE — Progress Notes (Signed)
[facility] Patient [facility] [address] [place], [place]  [address] Phone:  [phone]   Fax:  [phone]   Established Patient Office Visit  Subjective:  Patient ID: Tricia Bridges, female    DOB: 10-09-1965  Age: 55 y.o. MRN: [id number]  CC:  Chief Complaint  Patient presents with   Follow-up    PAP, needing accu check strips.  Mammogram:02/11/2021     HPI Tricia Bridges presents for physical. She  has a past medical history of Diabetes mellitus without complication (HCC), Hypertension, Pre-diabetes, Seizures (HCC), and Stroke (cerebrum) (HCC) (09/30/2020). .  She is in today to be update her CBE and pap test.   She is also following up for her HTN of the Enalapril 20 mg qd. Her BP is still slightly elevated today. She works at night and stands most of the night. She is taking her BP in the am. Denies headache, dizziness, visual changes, shortness of breath, dyspnea on exertion, chest pain, nausea, vomiting or any edema.   Past Medical History:  Diagnosis Date   Diabetes mellitus without complication (HCC)    Hypertension    Pre-diabetes    Seizures (HCC)    Stroke (cerebrum) (HCC) 09/30/2020    Past Surgical History:  Procedure Laterality Date   CESAREAN SECTION      Family History  Problem Relation Age of Onset   Hypertension Mother    COPD Mother        Smoker   Alcohol abuse Father    Hypertension Sister     Social History   Socioeconomic History   Marital status: Single    Spouse name: Not on file   Number of children: Not on file   Years of education: Not on file   Highest education level: Not on file  Occupational History   Not on file  Tobacco Use   Smoking status: Never   Smokeless tobacco: Never  Vaping Use   Vaping Use: Never used  Substance and Sexual Activity   Alcohol use: No   Drug use: No   Sexual activity: Not Currently  Other Topics Concern   Not on file  Social History Narrative   Lives with daughter   Right  Handed   Drinks caffeine occassionally   Social Determinants of Health   Financial Resource Strain: Not on file  Food Insecurity: Not on file  Transportation Needs: Not on file  Physical Activity: Not on file  Stress: Not on file  Social Connections: Not on file  Intimate Partner Violence: Not on file    Outpatient Medications Prior to Visit  Medication Sig Dispense Refill   amLODipine (NORVASC) 10 MG tablet Take 1 tablet (10 mg total) by mouth daily. 90 tablet 3   aspirin EC 81 MG tablet Take 1 tablet (81 mg total) by mouth daily. 90 tablet 3   atorvastatin (LIPITOR) 40 MG tablet Take 1 tablet (40 mg total) by mouth daily. 90 tablet 3   Cholecalciferol (VITAMIN D PO) Take 1 tablet by mouth daily.     enalapril (VASOTEC) 20 MG tablet Take 1 tablet (20 mg total) by mouth daily. 90 tablet 3   fluticasone (FLONASE) 50 MCG/ACT nasal spray Place 2 sprays into both nostrils daily. 16 g 6   glucose blood test strip Test blood glucose twice daily. Contour next strips. DX E11.56 100 each prn   insulin glargine (LANTUS SOLOSTAR) 100 UNIT/ML Solostar Pen Inject 20 Units into the skin 2 (two)  times daily. 36 mL 3   Insulin Pen Needle (PEN NEEDLES 31GX5/16") 31G X 8 MM MISC Use as directed 100 each 2   levETIRAcetam (KEPPRA) 500 MG tablet Take 1 tablet (500 mg total) by mouth 2 (two) times daily. 60 tablet 0   metFORMIN (GLUCOPHAGE-XR) 750 MG 24 hr tablet Take 1 tablet (750 mg total) by mouth daily with breakfast. 90 tablet 3   Vitamin E 400 units TABS Take 1 tablet (400 Units total) by mouth daily. 90 tablet 3   No facility-administered medications prior to visit.    Allergies  Allergen Reactions   Penicillins Itching    Has patient had a PCN reaction causing immediate rash, facial/tongue/throat swelling, SOB or lightheadedness with hypotension: no Has patient had a PCN reaction causing severe rash involving mucus membranes or skin necrosis: no Has patient had a PCN reaction that required  hospitalization: no Has patient had a PCN reaction occurring within the last 10 years: yes If all of the above answers are "NO", then may proceed with Cephalosporin use.     ROS Review of Systems    Objective:    Physical Exam Exam conducted with a chaperone present.  Constitutional:      Appearance: She is normal weight.  HENT:     Head: Normocephalic and atraumatic.  Cardiovascular:     Rate and Rhythm: Normal rate.     Pulses: Normal pulses.  Genitourinary:    General: Normal vulva.     Vagina: No vaginal discharge.     Rectum: Normal.  Musculoskeletal:        General: Normal range of motion.  Skin:    General: Skin is warm and dry.     Capillary Refill: Capillary refill takes less than 2 seconds.  Neurological:     General: No focal deficit present.     Mental Status: She is alert and oriented to person, place, and time.  Psychiatric:        Mood and Affect: Mood normal.        Behavior: Behavior normal.        Thought Content: Thought content normal.        Judgment: Judgment normal.    BP (!) 159/99 (BP Location: Right Arm, Patient Position: Sitting)   Pulse 86   Temp (!) 97.1 F (36.2 C)   Ht 5\' 4"  (1.626 m)   Wt 150 lb 0.2 oz (68 kg)   SpO2 100%   BMI 25.75 kg/m  Wt Readings from Last 3 Encounters:  02/20/21 150 lb 0.2 oz (68 kg)  01/27/21 150 lb (68 kg)  12/05/20 147 lb 0.8 oz (66.7 kg)     Health Maintenance Due  Topic Date Due   COVID-19 Vaccine (1) Never done   OPHTHALMOLOGY EXAM  08/14/2015   Pneumococcal Vaccine 54-74 Years old (2 - PCV) 08/14/2018   PAP SMEAR-Modifier  09/24/2020    There are no preventive care reminders to display for this patient.  Lab Results  Component Value Date   TSH 0.59 08/13/2017   Lab Results  Component Value Date   WBC 11.4 (H) 11/17/2020   HGB 13.9 11/17/2020   HCT 41.0 11/17/2020   MCV 88.4 11/17/2020   PLT 203 11/17/2020   Lab Results  Component Value Date   NA 137 11/17/2020   K 4.0  11/17/2020   CO2 24 11/17/2020   GLUCOSE 287 (H) 11/17/2020   BUN 15 11/17/2020   CREATININE 0.92 11/17/2020   BILITOT  0.8 11/17/2020   ALKPHOS 98 11/17/2020   AST 29 11/17/2020   ALT 28 11/17/2020   PROT 7.4 11/17/2020   ALBUMIN 3.5 11/17/2020   CALCIUM 9.2 11/17/2020   ANIONGAP 12 11/17/2020   GFR 89.52 02/25/2018   Lab Results  Component Value Date   CHOL 245 (H) 09/19/2020   Lab Results  Component Value Date   HDL 47 09/19/2020   Lab Results  Component Value Date   LDLCALC 179 (H) 09/19/2020   Lab Results  Component Value Date   TRIG 97 09/19/2020   Lab Results  Component Value Date   CHOLHDL 5.2 09/19/2020   Lab Results  Component Value Date   HGBA1C 11.0 (A) 01/27/2021   HGBA1C 11.0 01/27/2021   HGBA1C 11.0 (A) 01/27/2021   HGBA1C 11.0 (A) 01/27/2021      Assessment & Plan:   Problem List Items Addressed This Visit       Cardiovascular and Mediastinum   Essential hypertension Discussed changing time of day  Encouraged on going compliance with current medication regimen Encouraged home monitoring and recording BP <130/80 Eating a heart-healthy diet with less salt Encouraged regular physical activity     Other Visit Diagnoses     Gynecologic exam normal    -  Primary Completed    Relevant Orders   IGP, rfx Aptima HPV ASCU   Encounter for screening breast examination        Relevant Orders   MM Digital Screening       No orders of the defined types were placed in this encounter.   Follow-up: Return in about 4 weeks (around 03/20/2021).    Barbette Merino, NP

## 2021-02-20 NOTE — Patient Instructions (Addendum)
Pap Test °Why am I having this test? °A Pap test, also called a Pap smear, is a screening test to check for signs of: °Infection. °Cancer of the cervix. The cervix is the lower part of the uterus that opens into the vagina. °Changes that may be a sign that cancer is developing (precancerous changes). °Women need this test on a regular basis. In general, you should have a Pap test every 3 years until you reach menopause or age 57. Women aged 30-60 may choose to have their Pap test done at the same time as an HPV (human papillomavirus) test every 5 years (instead of every 3 years). °Your health care provider may recommend having Pap tests more or less often depending on your medical conditions and past Pap test results. °What is being tested? °Cervical cells are tested for signs of infection or abnormalities. °What kind of sample is taken? °Your health care provider will collect a sample of cells from the surface of your cervix. This will be done using a small cotton swab, plastic spatula, or brush that is inserted into your vagina using a tool called a speculum. This sample is often collected during a pelvic exam, when you are lying on your back on an exam table with your feet in footrests (stirrups). In some cases, fluids (secretions) from the cervix or vagina may also be collected. °How do I prepare for this test? °Be aware of where you are in your menstrual cycle. If you are menstruating on the day of the test, you may be asked to reschedule. °You may need to reschedule if you have a known vaginal infection on the day of the test. °Follow instructions from your health care provider about: °Changing or stopping your regular medicines. Some medicines can cause abnormal test results, such as vaginal medicines and tetracycline. °Avoiding douching 2-3 days before or the day of the test. °Tell a health care provider about: °Any allergies you have. °All medicines you are taking, including vitamins, herbs, eye drops,  creams, and over-the-counter medicines. °Any bleeding problems you have. °Any surgeries you have had. °Any medical conditions you have. °Whether you are pregnant or may be pregnant. °How are the results reported? °Your test results will be reported as either abnormal or normal. °What do the results mean? °A normal test result means that you do not have signs of cancer of the cervix. °An abnormal result may mean that you have: °Cancer. A Pap test by itself is not enough to diagnose cancer. You will have more tests done if cancer is suspected. °Precancerous changes in your cervix. °Inflammation of the cervix. °An STI (sexually transmitted infection). °A fungal infection. °A parasite infection. °Talk with your health care provider about what your results mean. In some cases, your health care provider may do more testing to confirm the results. °Questions to ask your health care provider °Ask your health care provider, or the department that is doing the test: °When will my results be ready? °How will I get my results? °What are my treatment options? °What other tests do I need? °What are my next steps? °Summary °In general, women should have a Pap test every 3 years until they reach menopause or age 57. °Your health care provider will collect a sample of cells from the surface of your cervix. This will be done using a small cotton swab, plastic spatula, or brush. °In some cases, fluids (secretions) from the cervix or vagina may also be collected. °This information is not intended   to replace advice given to you by your health care provider. Make sure you discuss any questions you have with your health care provider. °Document Revised: 06/28/2020 Document Reviewed: 06/28/2020 °Elsevier Patient Education © 2022 [employer] ° °

## 2021-02-22 LAB — IGP, RFX APTIMA HPV ASCU

## 2021-02-27 ENCOUNTER — Other Ambulatory Visit: Payer: Self-pay | Admitting: Nurse Practitioner

## 2021-02-27 MED ORDER — ACCU-CHEK GUIDE VI STRP: ORAL_STRIP | 12 refills | Status: DC

## 2021-02-27 NOTE — Progress Notes (Signed)
   [facility] [address] [place], [place]  [address] Phone:  [phone]   Fax:  [phone] 

## 2021-03-17 ENCOUNTER — Other Ambulatory Visit: Payer: Self-pay

## 2021-03-17 DIAGNOSIS — Z794 Long term (current) use of insulin: Secondary | ICD-10-CM

## 2021-03-17 DIAGNOSIS — E119 Type 2 diabetes mellitus without complications: Secondary | ICD-10-CM

## 2021-03-17 MED ORDER — ACCU-CHEK GUIDE VI STRP: ORAL_STRIP | 12 refills | Status: DC

## 2021-03-21 ENCOUNTER — Encounter: Payer: Self-pay | Admitting: Nurse Practitioner

## 2021-03-21 ENCOUNTER — Ambulatory Visit (INDEPENDENT_AMBULATORY_CARE_PROVIDER_SITE_OTHER): Payer: 59 | Admitting: Nurse Practitioner

## 2021-03-21 ENCOUNTER — Other Ambulatory Visit: Payer: Self-pay

## 2021-03-21 VITALS — BP 140/94 | HR 71 | Temp 97.9°F | Ht 64.0 in | Wt 151.2 lb

## 2021-03-21 DIAGNOSIS — E782 Mixed hyperlipidemia: Secondary | ICD-10-CM

## 2021-03-21 DIAGNOSIS — I10 Essential (primary) hypertension: Secondary | ICD-10-CM

## 2021-03-21 DIAGNOSIS — Z794 Long term (current) use of insulin: Secondary | ICD-10-CM

## 2021-03-21 DIAGNOSIS — E119 Type 2 diabetes mellitus without complications: Secondary | ICD-10-CM | POA: Diagnosis not present

## 2021-03-21 LAB — POCT GLYCOSYLATED HEMOGLOBIN (HGB A1C)
HbA1c POC (<> result, manual entry): 12.7 % (ref 4.0–5.6)
HbA1c, POC (controlled diabetic range): 12.7 % — AB (ref 0.0–7.0)
HbA1c, POC (prediabetic range): 12.7 % — AB (ref 5.7–6.4)
Hemoglobin A1C: 12.7 % — AB (ref 4.0–5.6)

## 2021-03-21 MED ORDER — VITAMIN E 400 UNITS PO TABS: 1.0000 | ORAL_TABLET | Freq: Every day | ORAL | 3 refills | Status: DC

## 2021-03-21 MED ORDER — LANTUS SOLOSTAR 100 UNIT/ML ~~LOC~~ SOPN: 30.0000 [IU] | PEN_INJECTOR | Freq: Two times a day (BID) | SUBCUTANEOUS | 3 refills | Status: DC

## 2021-03-21 NOTE — Patient Instructions (Signed)

## 2021-03-21 NOTE — Progress Notes (Signed)
[facility] Patient [facility] [address] [place], [place]  [address] Phone:  [phone]   Fax:  [phone]   Established Patient Office Visit  Subjective:  Patient ID: Tricia Bridges, female    DOB: Aug 22, 1965  Age: 55 y.o. MRN: [id number]  CC:  Chief Complaint  Patient presents with   Follow-up    Pt is here today for her follow up visit.     HPI Tricia Bridges presents for follow up. She  has a past medical history of Diabetes mellitus without complication (HCC), Hypertension, Pre-diabetes, Seizures (HCC), and Stroke (cerebrum) (HCC) (09/30/2020).   She is following up with her HTN and DM. She is prescribed Enalapril 20 mg and amlodipine 10 mg . She reports compliance with this regimen. On yesterday she had a tightness in her chest but this was not normal. She denies any activity. She was relaxing  She is prescribed metformin Xr 750 mg and Lantus 20 units BID. She is not monitoring her CBG due to just getting the test strips. She is eating less pasta and meats. She is eating vegetables and salad. She air fries her chicken. She is eating 3 meal per day. She has a snack in between.   She reports that her left arm gets really weak during her working hours. She works nights. She has pain in the left arm. She has aching in the shoulder. She is exhausted a lot but she goes to work each night.  She sleeps all day. She has FMLA in place now.   Denies headache, dizziness, visual changes, shortness of breath, dyspnea on exertion, chest pain, nausea, vomiting or any edema.     Past Medical History:  Diagnosis Date   Diabetes mellitus without complication (HCC)    Hypertension    Pre-diabetes    Seizures (HCC)    Stroke (cerebrum) (HCC) 09/30/2020    Past Surgical History:  Procedure Laterality Date   CESAREAN SECTION      Family History  Problem Relation Age of Onset   Hypertension Mother    COPD Mother        Smoker   Alcohol abuse Father    Hypertension Sister      Social History   Socioeconomic History   Marital status: Single    Spouse name: Not on file   Number of children: Not on file   Years of education: Not on file   Highest education level: Not on file  Occupational History   Not on file  Tobacco Use   Smoking status: Never   Smokeless tobacco: Never  Vaping Use   Vaping Use: Never used  Substance and Sexual Activity   Alcohol use: No   Drug use: No   Sexual activity: Not Currently  Other Topics Concern   Not on file  Social History Narrative   Lives with daughter   Right Handed   Drinks caffeine occassionally   Social Determinants of Health   Financial Resource Strain: Not on file  Food Insecurity: Not on file  Transportation Needs: Not on file  Physical Activity: Not on file  Stress: Not on file  Social Connections: Not on file  Intimate Partner Violence: Not on file    Outpatient Medications Prior to Visit  Medication Sig Dispense Refill   amLODipine (NORVASC) 10 MG tablet Take 1 tablet (10 mg total) by mouth daily. 90 tablet 3   aspirin EC 81 MG tablet Take 1 tablet (81 mg total) by  mouth daily. 90 tablet 3   atorvastatin (LIPITOR) 40 MG tablet Take 1 tablet (40 mg total) by mouth daily. 90 tablet 3   Cholecalciferol (VITAMIN D PO) Take 1 tablet by mouth daily.     enalapril (VASOTEC) 20 MG tablet Take 1 tablet (20 mg total) by mouth daily. 90 tablet 3   fluticasone (FLONASE) 50 MCG/ACT nasal spray Place 2 sprays into both nostrils daily. 16 g 6   glucose blood (ACCU-CHEK GUIDE) test strip Use as directed two time daily. 100 each 12   Insulin Pen Needle (PEN NEEDLES 31GX5/16") 31G X 8 MM MISC Use as directed 100 each 2   metFORMIN (GLUCOPHAGE-XR) 750 MG 24 hr tablet Take 1 tablet (750 mg total) by mouth daily with breakfast. 90 tablet 3   insulin glargine (LANTUS SOLOSTAR) 100 UNIT/ML Solostar Pen Inject 20 Units into the skin 2 (two) times daily. 36 mL 3   Vitamin E 400 units TABS Take 1 tablet (400 Units  total) by mouth daily. 90 tablet 3   levETIRAcetam (KEPPRA) 500 MG tablet Take 1 tablet (500 mg total) by mouth 2 (two) times daily. (Patient not taking: Reported on 03/21/2021) 60 tablet 0   No facility-administered medications prior to visit.    Allergies  Allergen Reactions   Penicillins Itching    Has patient had a PCN reaction causing immediate rash, facial/tongue/throat swelling, SOB or lightheadedness with hypotension: no Has patient had a PCN reaction causing severe rash involving mucus membranes or skin necrosis: no Has patient had a PCN reaction that required hospitalization: no Has patient had a PCN reaction occurring within the last 10 years: yes If all of the above answers are "NO", then may proceed with Cephalosporin use.     ROS Review of Systems    Objective:    Physical Exam Constitutional:      Appearance: She is normal weight.  HENT:     Head: Normocephalic and atraumatic.     Nose: Nose normal.     Mouth/Throat:     Mouth: Mucous membranes are moist.  Eyes:     Pupils: Pupils are equal, round, and reactive to light.  Cardiovascular:     Rate and Rhythm: Normal rate and regular rhythm.     Pulses: Normal pulses.     Heart sounds: Normal heart sounds.  Pulmonary:     Effort: Pulmonary effort is normal.     Breath sounds: Normal breath sounds.  Abdominal:     General: Bowel sounds are normal. There is no distension.     Palpations: Abdomen is soft. There is no mass.     Tenderness: There is no abdominal tenderness.  Musculoskeletal:        General: Normal range of motion.     Cervical back: Normal range of motion and neck supple. No rigidity or tenderness.  Lymphadenopathy:     Cervical: No cervical adenopathy.  Skin:    General: Skin is warm and dry.     Capillary Refill: Capillary refill takes less than 2 seconds.  Neurological:     General: No focal deficit present.     Mental Status: She is alert and oriented to person, place, and time.   Psychiatric:        Mood and Affect: Mood normal.        Behavior: Behavior normal.        Thought Content: Thought content normal.        Judgment: Judgment normal.  BP (!) 140/94   Pulse 71   Temp 97.9 F (36.6 C)   Ht 5\' 4"  (1.626 m)   Wt 151 lb 3.2 oz (68.6 kg)   BMI 25.95 kg/m  Wt Readings from Last 3 Encounters:  03/21/21 151 lb 3.2 oz (68.6 kg)  02/20/21 150 lb 0.2 oz (68 kg)  01/27/21 150 lb (68 kg)     There are no preventive care reminders to display for this patient.   There are no preventive care reminders to display for this patient.  Lab Results  Component Value Date   TSH 0.59 08/13/2017   Lab Results  Component Value Date   WBC 11.4 (H) 11/17/2020   HGB 13.9 11/17/2020   HCT 41.0 11/17/2020   MCV 88.4 11/17/2020   PLT 203 11/17/2020   Lab Results  Component Value Date   NA 137 11/17/2020   K 4.0 11/17/2020   CO2 24 11/17/2020   GLUCOSE 287 (H) 11/17/2020   BUN 15 11/17/2020   CREATININE 0.92 11/17/2020   BILITOT 0.8 11/17/2020   ALKPHOS 98 11/17/2020   AST 29 11/17/2020   ALT 28 11/17/2020   PROT 7.4 11/17/2020   ALBUMIN 3.5 11/17/2020   CALCIUM 9.2 11/17/2020   ANIONGAP 12 11/17/2020   GFR 89.52 02/25/2018   Lab Results  Component Value Date   CHOL 245 (H) 09/19/2020   Lab Results  Component Value Date   HDL 47 09/19/2020   Lab Results  Component Value Date   LDLCALC 179 (H) 09/19/2020   Lab Results  Component Value Date   TRIG 97 09/19/2020   Lab Results  Component Value Date   CHOLHDL 5.2 09/19/2020   Lab Results  Component Value Date   HGBA1C 12.7 (A) 03/21/2021   HGBA1C 12.7 03/21/2021   HGBA1C 12.7 (A) 03/21/2021   HGBA1C 12.7 (A) 03/21/2021      Assessment & Plan:   Problem List Items Addressed This Visit       Cardiovascular and Mediastinum   Essential hypertension Persistent  Declines wanting additional pills at this time.  Encouraged on going compliance with current medication  regimen Encouraged home monitoring and recording BP <130/80 Eating a heart-healthy diet with less salt Encouraged regular physical activity  Recommend Weight loss   Relevant Orders   Comp. Metabolic Panel (12)     Endocrine   Type 2 diabetes mellitus without complication, with long-term current use of insulin (HCC) - Primary Encourage compliance with current treatment regimen  Dose adjustment increased Lantus 30 units BID Declined Novolog TID for now Encourage regular CBG monitoring Encourage contacting office if excessive hyperglycemia and or hypoglycemia Lifestyle modification with healthy diet (fewer calories, more high fiber foods, whole grains and non-starchy vegetables, lower fat meat and fish, low-fat diary include healthy oils) regular exercise (physical activity) and weight loss Opthalmology exam discussed  Nutritional consult recommended Regular dental visits encouraged Home BP monitoring also encouraged goal <130/80    Relevant Medications   insulin glargine (LANTUS SOLOSTAR) 100 UNIT/ML Solostar Pen   Other Relevant Orders   HgB A1c (Completed)   Comp. Metabolic Panel (12)     Other   Mixed hyperlipidemia Persistent  Labs pending Continue with current regimen.    Relevant Orders   Lipid panel    Meds ordered this encounter  Medications   Vitamin E 400 units TABS    Sig: Take 1 tablet (400 Units total) by mouth daily.    Dispense:  90 tablet  Refill:  3   insulin glargine (LANTUS SOLOSTAR) 100 UNIT/ML Solostar Pen    Sig: Inject 30 Units into the skin 2 (two) times daily.    Dispense:  54 mL    Refill:  3    Order Specific Question:   Supervising Provider    Answer:   Quentin Angst [id number]     Follow-up: Return in about 6 weeks (around 05/02/2021) for follow up [id number].    Barbette Merino, NP

## 2021-03-22 LAB — COMP. METABOLIC PANEL (12)
AST: 15 IU/L (ref 0–40)
Albumin/Globulin Ratio: 1.9 (ref 1.2–2.2)
Albumin: 4.7 g/dL (ref 3.8–4.9)
Alkaline Phosphatase: 115 IU/L (ref 44–121)
BUN/Creatinine Ratio: 12 (ref 9–23)
BUN: 9 mg/dL (ref 6–24)
Bilirubin Total: 0.3 mg/dL (ref 0.0–1.2)
Calcium: 9.7 mg/dL (ref 8.7–10.2)
Chloride: 100 mmol/L (ref 96–106)
Creatinine, Ser: 0.74 mg/dL (ref 0.57–1.00)
Globulin, Total: 2.5 g/dL (ref 1.5–4.5)
Glucose: 132 mg/dL — ABNORMAL HIGH (ref 70–99)
Potassium: 4.1 mmol/L (ref 3.5–5.2)
Sodium: 141 mmol/L (ref 134–144)
Total Protein: 7.2 g/dL (ref 6.0–8.5)
eGFR: 94 mL/min/{1.73_m2} (ref >59)

## 2021-03-22 LAB — LIPID PANEL
Chol/HDL Ratio: 3.3 ratio (ref 0.0–4.4)
Cholesterol, Total: 187 mg/dL (ref 100–199)
HDL: 56 mg/dL (ref >39)
LDL Chol Calc (NIH): 113 mg/dL — ABNORMAL HIGH (ref 0–99)
Triglycerides: 98 mg/dL (ref 0–149)
VLDL Cholesterol Cal: 18 mg/dL (ref 5–40)

## 2021-04-01 NOTE — Progress Notes (Signed)
PATIENT: Tricia Bridges DOB: [birth year]  REASON FOR VISIT: Follow up HISTORY FROM: Patient PRIMARY NEUROLOGIST: Dr. Teresa Coombs now that Dr. Anne Hahn is retired  HISTORY OF PRESENT ILLNESS: Today 04/02/21 Tricia Bridges is a 55 year old female here today for follow-up with history of seizures when very ill June 2022 with severely elevated blood sugars and acute stroke event, MRI of the brain showed evidence of a left caudate focus consistent with an acute to early subacute ischemic infarct, MRA of the head and neck revealed evidence of severe stenosis of the left A1 and M1 segments.  Remains on single agent aspirin.  Also on Keppra for seizures.  No further seizures. Last A1C 12.19 Mar 2021. Has left shoulder pain since illness, her hands get tight/stiff, occasional twitchy pain to head, once a month, less than 10 seconds. Saw PCP recently BP medication adjusted. No seizures, tolerating Keppra okay. 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 Has FLMA now, has fatigue since the stroke event. Overall doing well.   HISTORY  09/30/2020 Dr. Anne Hahn: Tricia Bridges is a 55 year old right handed black female with a history of poorly controlled diabetes and a history of hypertension.  She was admitted to the hospital on 16 September 2020 with onset of seizures, status epilepticus.  Admission blood work revealed a glucose level of 623, and a white blood count of 20.  The patient was febrile.  A urine drug screen was positive only for benzodiazepines.  The lactic acid level was 5.7.  The troponin I level was 49.  The hemoglobin A1c was greater than 15.5.  CK enzyme levels peaked at 15,800.  The patient has been noted to have a seizure at home prior to coming into the hospital and EMS witnessed 2 other seizures.  Propofol was used following intubation for seizure control.  The patient eventually was switched over to Keppra.  The patient was started on insulin infusion.  MRI of the brain showed evidence of a left caudate focus  consistent with an acute to early subacute ischemic infarct, MRA of the head and neck revealed evidence of severe stenosis of the left A1 and M1 segments.  The patient was treated with aspirin and Plavix combination.  After hospitalization, the patient has done fairly well, she believes that her mentation has returned to near normal.  She is not having any headaches, dizziness, or any residual numbness or weakness of extremities.  She denies any gait disturbance.  She did have some blurred vision initially, but this has improved.  She has not had any recurrent seizures.  She is on aspirin and Plavix combination, she is to stop the Plavix after 3 weeks.  She is tolerating the Keppra well.  She no longer has any muscle stiffness or any muscle weakness.  She indicates that she does not smoke cigarettes or drink alcohol.  The patient comes in with her daughter today, the daughter witnessed the first seizure at home, the seizure was a generalized event lasting a minute or so associated with urinary incontinence and tongue biting.  The patient denies any prior history of seizures, she denies any history of head trauma or any family history of seizures.  The patient was not taking care of her diabetes prior to admission, she was drinking regular soft drinks and she was on only 3 units of insulin daily, she was to be taking 20 units a day.  REVIEW OF SYSTEMS: Out of a complete 14 system review of symptoms, the  patient complains only of the following symptoms, and all other reviewed systems are negative.   See HPI  ALLERGIES: Allergies  Allergen Reactions   Penicillins Itching    Has patient had a PCN reaction causing immediate rash, facial/tongue/throat swelling, SOB or lightheadedness with hypotension: no Has patient had a PCN reaction causing severe rash involving mucus membranes or skin necrosis: no Has patient had a PCN reaction that required hospitalization: no Has patient had a PCN reaction occurring  within the last 10 years: yes If all of the above answers are "NO", then may proceed with Cephalosporin use.     HOME MEDICATIONS: Outpatient Medications Prior to Visit  Medication Sig Dispense Refill   amLODipine (NORVASC) 10 MG tablet Take 1 tablet (10 mg total) by mouth daily. 90 tablet 3   aspirin EC 81 MG tablet Take 1 tablet (81 mg total) by mouth daily. 90 tablet 3   atorvastatin (LIPITOR) 40 MG tablet Take 1 tablet (40 mg total) by mouth daily. 90 tablet 3   Cholecalciferol (VITAMIN D PO) Take 1 tablet by mouth daily.     enalapril (VASOTEC) 20 MG tablet Take 1 tablet (20 mg total) by mouth daily. 90 tablet 3   fluticasone (FLONASE) 50 MCG/ACT nasal spray Place 2 sprays into both nostrils daily. 16 g 6   glucose blood (ACCU-CHEK GUIDE) test strip Use as directed two time daily. 100 each 12   insulin glargine (LANTUS SOLOSTAR) 100 UNIT/ML Solostar Pen Inject 30 Units into the skin 2 (two) times daily. 54 mL 3   Insulin Pen Needle (PEN NEEDLES 31GX5/16") 31G X 8 MM MISC Use as directed 100 each 2   metFORMIN (GLUCOPHAGE-XR) 750 MG 24 hr tablet Take 1 tablet (750 mg total) by mouth daily with breakfast. 90 tablet 3   Vitamin E 400 units TABS Take 1 tablet (400 Units total) by mouth daily. 90 tablet 3   levETIRAcetam (KEPPRA) 500 MG tablet Take 1 tablet (500 mg total) by mouth 2 (two) times daily. 60 tablet 0   No facility-administered medications prior to visit.    PAST MEDICAL HISTORY: Past Medical History:  Diagnosis Date   Diabetes mellitus without complication (HCC)    Hypertension    Pre-diabetes    Seizures (HCC)    Stroke (cerebrum) (HCC) 09/30/2020    PAST SURGICAL HISTORY: Past Surgical History:  Procedure Laterality Date   CESAREAN SECTION      FAMILY HISTORY: Family History  Problem Relation Age of Onset   Hypertension Mother    COPD Mother        Smoker   Alcohol abuse Father    Hypertension Sister     SOCIAL HISTORY: Social History    Socioeconomic History   Marital status: Single    Spouse name: Not on file   Number of children: Not on file   Years of education: Not on file   Highest education level: Not on file  Occupational History   Not on file  Tobacco Use   Smoking status: Never   Smokeless tobacco: Never  Vaping Use   Vaping Use: Never used  Substance and Sexual Activity   Alcohol use: No   Drug use: No   Sexual activity: Not Currently  Other Topics Concern   Not on file  Social History Narrative   Lives with daughter   Right Handed   Drinks caffeine occassionally   Social Determinants of Health   Financial Resource Strain: Not on file  Food Insecurity:  Not on file  Transportation Needs: Not on file  Physical Activity: Not on file  Stress: Not on file  Social Connections: Not on file  Intimate Partner Violence: Not on file      PHYSICAL EXAM  Vitals:   04/02/21 0740  BP: (!) 158/94  Pulse: 75  Weight: 150 lb (68 kg)  Height: 5\' 4"  (1.626 m)   Body mass index is 25.75 kg/m.  Generalized: Well developed, in no acute distress   Neurological examination  Mentation: Alert oriented to time, place, history taking. Follows all commands speech and language fluent Cranial nerve II-XII: Pupils were equal round reactive to light. Extraocular movements were full, visual field were full on confrontational test. Facial sensation and strength were normal. Uvula tongue midline. Head turning and shoulder shrug  were normal and symmetric. Motor: The motor testing reveals 5 over 5 strength of all 4 extremities. Good symmetric motor tone is noted throughout.  Sensory: Sensory testing is intact to soft touch on all 4 extremities. No evidence of extinction is noted.  Coordination: Cerebellar testing reveals good finger-nose-finger and heel-to-shin bilaterally.  Gait and station: Gait is normal. Tandem gait is normal.  Reflexes: Deep tendon reflexes are symmetric and normal bilaterally.   DIAGNOSTIC  DATA (LABS, IMAGING, TESTING) - I reviewed patient records, labs, notes, testing and imaging myself where available.  Lab Results  Component Value Date   WBC 11.4 (H) 11/17/2020   HGB 13.9 11/17/2020   HCT 41.0 11/17/2020   MCV 88.4 11/17/2020   PLT 203 11/17/2020      Component Value Date/Time   NA 141 03/21/2021 1208   K 4.1 03/21/2021 1208   CL 100 03/21/2021 1208   CO2 24 11/17/2020 2220   GLUCOSE 132 (H) 03/21/2021 1208   GLUCOSE 287 (H) 11/17/2020 2220   BUN 9 03/21/2021 1208   CREATININE 0.74 03/21/2021 1208   CALCIUM 9.7 03/21/2021 1208   PROT 7.2 03/21/2021 1208   ALBUMIN 4.7 03/21/2021 1208   AST 15 03/21/2021 1208   ALT 28 11/17/2020 2220   ALKPHOS 115 03/21/2021 1208   BILITOT 0.3 03/21/2021 1208   GFRNONAA >60 11/17/2020 2220   GFRAA 49 (L) 06/15/2016 1727   Lab Results  Component Value Date   CHOL 187 03/21/2021   HDL 56 03/21/2021   LDLCALC 113 (H) 03/21/2021   TRIG 98 03/21/2021   CHOLHDL 3.3 03/21/2021   Lab Results  Component Value Date   HGBA1C 12.7 (A) 03/21/2021   HGBA1C 12.7 03/21/2021   HGBA1C 12.7 (A) 03/21/2021   HGBA1C 12.7 (A) 03/21/2021   No results found for: VITAMINB12 Lab Results  Component Value Date   TSH 0.59 08/13/2017    ASSESSMENT AND PLAN 55 y.o. year old female  has a past medical history of Diabetes mellitus without complication (HCC), Hypertension, Pre-diabetes, Seizures (HCC), and Stroke (cerebrum) (HCC) (09/30/2020). here with:  1.  History of diabetes, poorly controlled   2.  Status epilepticus   3.  Cerebrovascular disease, left caudate infarct  -Overall, doing well, no further seizure events, is back to work -Needs to continue to work with PCP on management of vascular risk factors, recent A1c was still 12, ideally goal is less than 7, working on BP management goal < 130/90 (didn't take meds today), On Lipitor, goal LDL < 70, most recent was 10/02/2020 -Continue aspirin 81 mg daily -After discussion, she prefers to  stay on Keppra for the time being, the thought of doing a trial off  was suggested 6 months after the event, her seizure events occurred during time of severely elevated blood glucose, and acute stroke event -Call for any problems or concerns, otherwise we will follow-up in 6 months or sooner if needed, will follow with Dr. Levonne Lapping, AGNP-C, DNP 04/02/2021, 8:29 AM [facility] [address] [place], [place] [address] [phone]

## 2021-04-02 ENCOUNTER — Encounter: Payer: Self-pay | Admitting: Neurology

## 2021-04-02 ENCOUNTER — Other Ambulatory Visit: Payer: Self-pay

## 2021-04-02 ENCOUNTER — Ambulatory Visit: Payer: 59 | Admitting: Neurology

## 2021-04-02 DIAGNOSIS — I63512 Cerebral infarction due to unspecified occlusion or stenosis of left middle cerebral artery: Secondary | ICD-10-CM | POA: Diagnosis not present

## 2021-04-02 DIAGNOSIS — G40901 Epilepsy, unspecified, not intractable, with status epilepticus: Secondary | ICD-10-CM

## 2021-04-02 MED ORDER — LEVETIRACETAM 500 MG PO TABS: 500.0000 mg | ORAL_TABLET | Freq: Two times a day (BID) | ORAL | 3 refills | Status: DC

## 2021-04-02 NOTE — Patient Instructions (Signed)
Great to see you today  Continue to work with PCP for management of vascular risk factors Please take your BP medication, stay on aspirin  Continue the Keppra Follow-up in 6 months with Dr. Teresa Coombs

## 2021-04-10 ENCOUNTER — Other Ambulatory Visit: Payer: Self-pay

## 2021-04-10 DIAGNOSIS — E119 Type 2 diabetes mellitus without complications: Secondary | ICD-10-CM

## 2021-04-10 MED ORDER — "PEN NEEDLES 5/16"" 31G X 8 MM MISC": 1.0000 [IU] | Freq: Every day | 2 refills | Status: DC

## 2021-05-02 ENCOUNTER — Ambulatory Visit (INDEPENDENT_AMBULATORY_CARE_PROVIDER_SITE_OTHER): Payer: 59 | Admitting: Nurse Practitioner

## 2021-05-02 ENCOUNTER — Encounter: Payer: Self-pay | Admitting: Nurse Practitioner

## 2021-05-02 ENCOUNTER — Other Ambulatory Visit: Payer: Self-pay

## 2021-05-02 VITALS — BP 139/87 | HR 78 | Temp 98.2°F | Ht 64.0 in | Wt 155.6 lb

## 2021-05-02 DIAGNOSIS — Z794 Long term (current) use of insulin: Secondary | ICD-10-CM

## 2021-05-02 DIAGNOSIS — I10 Essential (primary) hypertension: Secondary | ICD-10-CM

## 2021-05-02 DIAGNOSIS — E782 Mixed hyperlipidemia: Secondary | ICD-10-CM

## 2021-05-02 DIAGNOSIS — E119 Type 2 diabetes mellitus without complications: Secondary | ICD-10-CM

## 2021-05-02 LAB — POCT GLYCOSYLATED HEMOGLOBIN (HGB A1C)
HbA1c POC (<> result, manual entry): 10.5 % (ref 4.0–5.6)
HbA1c, POC (controlled diabetic range): 10.5 % — AB (ref 0.0–7.0)
HbA1c, POC (prediabetic range): 10.5 % — AB (ref 5.7–6.4)
Hemoglobin A1C: 10.5 % — AB (ref 4.0–5.6)

## 2021-05-02 NOTE — Progress Notes (Signed)
[address], [place]  [address] Phone:  [phone]   Fax:  [phone]   Established Patient Office Visit  Subjective:  Patient ID: Tricia Bridges, female    DOB: [birth year]  Age: 56 y.o. MRN: [id number]  CC:  Chief Complaint  Patient presents with   Follow-up    Pt is here today for her 6 week follow up visit.  Pt states that she is still having a lot of tenderness in her left shoulder and thinks it is from the stroke she had. Pt would like to see what options she has to help with this pain and tenderness in her shoulder.     HPI Tricia Bridges presents for follow-up. 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  She is in today with a 6 weeks follow-up for dose adjustment of her Lantus to 30 units twice daily.  Her CBG range has improved mainly in the 100s but does have a few spikes in the mid 200s. Denies fever, chills, headache, dizziness, visual changes, polydipsia,  polyphagia, nausea, vomiting, polyuria, constipation, diarrhea, or any edema.    She has had some shortness of breath; no chest pain. She has had some shoulder pain and it is aching a lot.  She feels like the pain is associated with work.  She is able to work full-time.  The pain is also associated with lying down at night however she does not have to lay on her left shoulder for pain to be felt.  She has noticed that with walking long distances she has to rest.  She is following following up with neurology for history of status epilepticus.  Past Medical History:  Diagnosis Date   Diabetes mellitus without complication ([place])    Hypertension    Pre-diabetes    Seizures ([place])    Stroke (cerebrum) ([place]) 09/30/2020    Past Surgical History:  Procedure Laterality Date   CESAREAN SECTION      Family History  Problem Relation Age of Onset    Hypertension Mother    COPD Mother        Smoker   Alcohol abuse Father    Hypertension Sister     Social History   Socioeconomic History   Marital status: Single    Spouse name: Not on file   Number of children: Not on file   Years of education: Not on file   Highest education level: Not on file  Occupational History   Not on file  Tobacco Use   Smoking status: Never   Smokeless tobacco: Never  Vaping Use   Vaping Use: Never used  Substance and Sexual Activity   Alcohol use: No   Drug use: No   Sexual activity: Not Currently  Other Topics Concern   Not on file  Social History Narrative   Lives with daughter   Right Handed   Drinks caffeine occassionally   Social Determinants of Health   Financial Resource Strain: Not on file  Food Insecurity: Not on file  Transportation Needs: Not on file  Physical Activity: Not on file  Stress: Not on file  Social Connections: Not on file  Intimate Partner Violence: Not on file    Outpatient Medications Prior to Visit  Medication Sig Dispense Refill   amLODipine (NORVASC) 10 MG tablet Take 1 tablet (10 mg total) by mouth daily. [place]  tablet 3   aspirin EC 81 MG tablet Take 1 tablet (81 mg total) by mouth daily. 90 tablet 3   atorvastatin (LIPITOR) 40 MG tablet Take 1 tablet (40 mg total) by mouth daily. 90 tablet 3   Cholecalciferol (VITAMIN D PO) Take 1 tablet by mouth daily.     enalapril (VASOTEC) 20 MG tablet Take 1 tablet (20 mg total) by mouth daily. 90 tablet 3   fluticasone (FLONASE) 50 MCG/ACT nasal spray Place 2 sprays into both nostrils daily. 16 g 6   glucose blood (ACCU-CHEK GUIDE) test strip Use as directed two time daily. 100 each 12   insulin glargine (LANTUS SOLOSTAR) 100 UNIT/ML Solostar Pen Inject 30 Units into the skin 2 (two) times daily. 54 mL 3   Insulin Pen Needle (PEN NEEDLES 31GX5/16") 31G X 8 MM MISC Use as directed 100 each 2   levETIRAcetam (KEPPRA) 500 MG tablet Take 1 tablet (500 mg total) by mouth  2 (two) times daily. 180 tablet 3   metFORMIN (GLUCOPHAGE-XR) 750 MG 24 hr tablet Take 1 tablet (750 mg total) by mouth daily with breakfast. 90 tablet 3   Vitamin E 400 units TABS Take 1 tablet (400 Units total) by mouth daily. 90 tablet 3   No facility-administered medications prior to visit.    Allergies  Allergen Reactions   Penicillins Itching    Has patient had a PCN reaction causing immediate rash, facial/tongue/throat swelling, SOB or lightheadedness with hypotension: no Has patient had a PCN reaction causing severe rash involving mucus membranes or skin necrosis: no Has patient had a PCN reaction that required hospitalization: no Has patient had a PCN reaction occurring within the last 10 years: yes If all of the above answers are "NO", then may proceed with Cephalosporin use.     ROS Review of Systems    Objective:    Physical Exam Constitutional:      Appearance: She is normal weight.  HENT:     Head: Normocephalic and atraumatic.     Nose: Nose normal.     Mouth/Throat:     Mouth: Mucous membranes are moist.  Eyes:     Pupils: Pupils are equal, round, and reactive to light.  Cardiovascular:     Rate and Rhythm: Normal rate and regular rhythm.     Pulses: Normal pulses.     Heart sounds: Normal heart sounds.  Pulmonary:     Effort: Pulmonary effort is normal.     Breath sounds: Normal breath sounds.  Abdominal:     General: Bowel sounds are normal. There is no distension.     Palpations: Abdomen is soft. There is no mass.     Tenderness: There is no abdominal tenderness.  Musculoskeletal:        General: Normal range of motion.     Cervical back: Normal range of motion and neck supple. No rigidity or tenderness.  Lymphadenopathy:     Cervical: No cervical adenopathy.  Skin:    General: Skin is warm and dry.     Capillary Refill: Capillary refill takes less than 2 seconds.  Neurological:     General: No focal deficit present.     Mental Status: She is  alert and oriented to person, place, and time.  Psychiatric:        Mood and Affect: Mood normal.        Behavior: Behavior normal.        Thought Content: Thought content normal.  Judgment: Judgment normal.    BP 139/87    Pulse 78    Temp 98.2 F (36.8 C)    Ht 5' 4"  (1.626 m)    Wt 155 lb 9.6 oz (70.6 kg)    SpO2 99%    BMI 26.71 kg/m  Wt Readings from Last 3 Encounters:  05/02/21 155 lb 9.6 oz (70.6 kg)  04/02/21 150 lb (68 kg)  03/21/21 151 lb 3.2 oz (68.6 kg)     There are no preventive care reminders to display for this patient.   There are no preventive care reminders to display for this patient.  Lab Results  Component Value Date   TSH 0.59 08/13/2017   Lab Results  Component Value Date   WBC 11.4 (H) 11/17/2020   HGB 13.9 11/17/2020   HCT 41.0 11/17/2020   MCV 88.4 11/17/2020   PLT 203 11/17/2020   Lab Results  Component Value Date   NA 141 03/21/2021   K 4.1 03/21/2021   CO2 24 11/17/2020   GLUCOSE 132 (H) 03/21/2021   BUN 9 03/21/2021   CREATININE 0.74 03/21/2021   BILITOT 0.3 03/21/2021   ALKPHOS 115 03/21/2021   AST 15 03/21/2021   ALT 28 11/17/2020   PROT 7.2 03/21/2021   ALBUMIN 4.7 03/21/2021   CALCIUM 9.7 03/21/2021   ANIONGAP 12 11/17/2020   EGFR 94 03/21/2021   GFR 89.52 02/25/2018   Lab Results  Component Value Date   CHOL 187 03/21/2021   Lab Results  Component Value Date   HDL 56 03/21/2021   Lab Results  Component Value Date   LDLCALC 113 (H) 03/21/2021   Lab Results  Component Value Date   TRIG 98 03/21/2021   Lab Results  Component Value Date   CHOLHDL 3.3 03/21/2021   Lab Results  Component Value Date   HGBA1C 10.5 (A) 05/02/2021   HGBA1C 10.5 05/02/2021   HGBA1C 10.5 (A) 05/02/2021   HGBA1C 10.5 (A) 05/02/2021      Assessment & Plan:   Problem List Items Addressed This Visit       Cardiovascular and Mediastinum   Essential hypertension Improvement Encouraged on going compliance with current  medication regimen Encouraged home monitoring and recording BP <130/80 Eating a heart-healthy diet with less salt Encouraged regular physical activity  Recommend Weight loss       Endocrine   Type 2 diabetes mellitus without complication, with long-term current use of insulin (HCC) - Primary Persistent Repeat A1c in 6 to 8 weeks Encourage compliance with current treatment regimen   Encourage regular CBG monitoring Encourage contacting office if excessive hyperglycemia and or hypoglycemia Lifestyle modification with healthy diet (fewer calories, more high fiber foods, whole grains and non-starchy vegetables, lower fat meat and fish, low-fat diary include healthy oils) regular exercise (physical activity) a Opthalmology exam discussed  Nutritional consult recommended Regular dental visits encouraged Home BP monitoring also encouraged goal <130/80    Relevant Orders   HgB A1c (Completed)     Other   Mixed hyperlipidemia Stable Continue with atorvastatin 40 mg nightly    No orders of the defined types were placed in this encounter.   Follow-up: Return in about 2 months (around 06/30/2021) for follow up [id number], Follow up [id number] [id number].    Vevelyn Francois, NP

## 2021-05-02 NOTE — Patient Instructions (Addendum)
Salonpas patch  Managing Your Hypertension Hypertension, also called high blood pressure, is when the force of the blood pressing against the walls of the arteries is too strong. Arteries are blood vessels that carry blood from your heart throughout your body. Hypertension forces the heart to work harder to pump blood and may cause the arteries to become narrow or stiff. Understanding blood pressure readings Your personal target blood pressure may vary depending on your medical conditions, your age, and other factors. A blood pressure reading includes a higher number over a lower number. Ideally, your blood pressure should be below 120/80. You should know that: The first, or top, number is called the systolic pressure. It is a measure of the pressure in your arteries as your heart beats. The second, or bottom number, is called the diastolic pressure. It is a measure of the pressure in your arteries as the heart relaxes. Blood pressure is classified into four stages. Based on your blood pressure reading, your health care provider may use the following stages to determine what type of treatment you need, if any. Systolic pressure and diastolic pressure are measured in a unit called mmHg. Normal Systolic pressure: below 120. Diastolic pressure: below 80. Elevated Systolic pressure: 120-129. Diastolic pressure: below 80. Hypertension stage 1 Systolic pressure: 130-139. Diastolic pressure: 80-89. Hypertension stage 2 Systolic pressure: 140 or above. Diastolic pressure: 90 or above. How can this condition affect me? Managing your hypertension is an important responsibility. Over time, hypertension can damage the arteries and decrease blood flow to important parts of the body, including the brain, heart, and kidneys. Having untreated or uncontrolled hypertension can lead to: A heart attack. A stroke. A weakened blood vessel (aneurysm). Heart failure. Kidney damage. Eye damage. Metabolic  syndrome. Memory and concentration problems. Vascular dementia. What actions can I take to manage this condition? Hypertension can be managed by making lifestyle changes and possibly by taking medicines. Your health care provider will help you make a plan to bring your blood pressure within a normal range. Nutrition  Eat a diet that is high in fiber and potassium, and low in salt (sodium), added sugar, and fat. An example eating plan is called the Dietary Approaches to Stop Hypertension (DASH) diet. To eat this way: Eat plenty of fresh fruits and vegetables. Try to fill one-half of your plate at each meal with fruits and vegetables. Eat whole grains, such as whole-wheat pasta, brown rice, or whole-grain bread. Fill about one-fourth of your plate with whole grains. Eat low-fat dairy products. Avoid fatty cuts of meat, processed or cured meats, and poultry with skin. Fill about one-fourth of your plate with lean proteins such as fish, chicken without skin, beans, eggs, and tofu. Avoid pre-made and processed foods. These tend to be higher in sodium, added sugar, and fat. Reduce your daily sodium intake. Most people with hypertension should eat less than 1,500 mg of sodium a day. Lifestyle  Work with your health care provider to maintain a healthy body weight or to lose weight. Ask what an ideal weight is for you. Get at least 30 minutes of exercise that causes your heart to beat faster (aerobic exercise) most days of the week. Activities may include walking, swimming, or biking. Include exercise to strengthen your muscles (resistance exercise), such as weight lifting, as part of your weekly exercise routine. Try to do these types of exercises for 30 minutes at least 3 days a week. Do not use any products that contain nicotine or tobacco, such  as cigarettes, e-cigarettes, and chewing tobacco. If you need help quitting, ask your health care provider. Control any long-term (chronic) conditions you  have, such as high cholesterol or diabetes. Identify your sources of stress and find ways to manage stress. This may include meditation, deep breathing, or making time for fun activities. Alcohol use Do not drink alcohol if: Your health care provider tells you not to drink. You are pregnant, may be pregnant, or are planning to become pregnant. If you drink alcohol: Limit how much you use to: 0-1 drink a day for women. 0-2 drinks a day for men. Be aware of how much alcohol is in your drink. In the [place] one drink equals one 12 oz bottle of beer (355 mL), one 5 oz glass of wine (148 mL), or one 1 oz glass of hard liquor (44 mL). Medicines Your health care provider may prescribe medicine if lifestyle changes are not enough to get your blood pressure under control and if: Your systolic blood pressure is 130 or higher. Your diastolic blood pressure is 80 or higher. Take medicines only as told by your health care provider. Follow the directions carefully. Blood pressure medicines must be taken as told by your health care provider. The medicine does not work as well when you skip doses. Skipping doses also puts you at risk for problems. Monitoring Before you monitor your blood pressure: Do not smoke, drink caffeinated beverages, or exercise within 30 minutes before taking a measurement. Use the bathroom and empty your bladder (urinate). Sit quietly for at least 5 minutes before taking measurements. Monitor your blood pressure at home as told by your health care provider. To do this: Sit with your back straight and supported. Place your feet flat on the floor. Do not cross your legs. Support your arm on a flat surface, such as a table. Make sure your upper arm is at heart level. Each time you measure, take two or three readings one minute apart and record the results. You may also need to have your blood pressure checked regularly by your health care provider. General information Talk with your  health care provider about your diet, exercise habits, and other lifestyle factors that may be contributing to hypertension. Review all the medicines you take with your health care provider because there may be side effects or interactions. Keep all visits as told by your health care provider. Your health care provider can help you create and adjust your plan for managing your high blood pressure. Where to find more information National Heart, Lung, and Blood Institute: PopSteam.is American Heart Association: www.heart.org Contact a health care provider if: You think you are having a reaction to medicines you have taken. You have repeated (recurrent) headaches. You feel dizzy. You have swelling in your ankles. You have trouble with your vision. Get help right away if: You develop a severe headache or confusion. You have unusual weakness or numbness, or you feel faint. You have severe pain in your chest or abdomen. You vomit repeatedly. You have trouble breathing. These symptoms may represent a serious problem that is an emergency. Do not wait to see if the symptoms will go away. Get medical help right away. Call your local emergency services (911 in the [place] Do not drive yourself to the hospital. Summary Hypertension is when the force of blood pumping through your arteries is too strong. If this condition is not controlled, it may put you at risk for serious complications. Your personal target blood pressure may  vary depending on your medical conditions, your age, and other factors. For most people, a normal blood pressure is less than 120/80. Hypertension is managed by lifestyle changes, medicines, or both. Lifestyle changes to help manage hypertension include losing weight, eating a healthy, low-sodium diet, exercising more, stopping smoking, and limiting alcohol. This information is not intended to replace advice given to you by your health care provider. Make sure you discuss any  questions you have with your health care provider. Document Revised: 04/17/2019 Document Reviewed: 02/28/2019 Elsevier Patient Education  2022 Elsevier Inc.  Diabetes Mellitus and Nutrition, Adult When you have diabetes, or diabetes mellitus, it is very important to have healthy eating habits because your blood sugar (glucose) levels are greatly affected by what you eat and drink. Eating healthy foods in the right amounts, at about the same times every day, can help you: Manage your blood glucose. Lower your risk of heart disease. Improve your blood pressure. Reach or maintain a healthy weight. What can affect my meal plan? Every person with diabetes is different, and each person has different needs for a meal plan. Your health care provider may recommend that you work with a dietitian to make a meal plan that is best for you. Your meal plan may vary depending on factors such as: The calories you need. The medicines you take. Your weight. Your blood glucose, blood pressure, and cholesterol levels. Your activity level. Other health conditions you have, such as heart or kidney disease. How do carbohydrates affect me? Carbohydrates, also called carbs, affect your blood glucose level more than any other type of food. Eating carbs raises the amount of glucose in your blood. It is important to know how many carbs you can safely have in each meal. This is different for every person. Your dietitian can help you calculate how many carbs you should have at each meal and for each snack. How does alcohol affect me? Alcohol can cause a decrease in blood glucose (hypoglycemia), especially if you use insulin or take certain diabetes medicines by mouth. Hypoglycemia can be a life-threatening condition. Symptoms of hypoglycemia, such as sleepiness, dizziness, and confusion, are similar to symptoms of having too much alcohol. Do not drink alcohol if: Your health care provider tells you not to drink. You are  pregnant, may be pregnant, or are planning to become pregnant. If you drink alcohol: Limit how much you have to: 0-1 drink a day for women. 0-2 drinks a day for men. Know how much alcohol is in your drink. In the [place] one drink equals one 12 oz bottle of beer (355 mL), one 5 oz glass of wine (148 mL), or one 1 oz glass of hard liquor (44 mL). Keep yourself hydrated with water, diet soda, or unsweetened iced tea. Keep in mind that regular soda, juice, and other mixers may contain a lot of sugar and must be counted as carbs. What are tips for following this plan? Reading food labels Start by checking the serving size on the Nutrition Facts label of packaged foods and drinks. The number of calories and the amount of carbs, fats, and other nutrients listed on the label are based on one serving of the item. Many items contain more than one serving per package. Check the total grams (g) of carbs in one serving. Check the number of grams of saturated fats and trans fats in one serving. Choose foods that have a low amount or none of these fats. Check the number of milligrams (mg)  of salt (sodium) in one serving. Most people should limit total sodium intake to less than 2,300 mg per day. Always check the nutrition information of foods labeled as "low-fat" or "nonfat." These foods may be higher in added sugar or refined carbs and should be avoided. Talk to your dietitian to identify your daily goals for nutrients listed on the label. Shopping Avoid buying canned, pre-made, or processed foods. These foods tend to be high in fat, sodium, and added sugar. Shop around the outside edge of the grocery store. This is where you will most often find fresh fruits and vegetables, bulk grains, fresh meats, and fresh dairy products. Cooking Use low-heat cooking methods, such as baking, instead of high-heat cooking methods, such as deep frying. Cook using healthy oils, such as olive, canola, or sunflower oil. Avoid  cooking with butter, cream, or high-fat meats. Meal planning Eat meals and snacks regularly, preferably at the same times every day. Avoid going long periods of time without eating. Eat foods that are high in fiber, such as fresh fruits, vegetables, beans, and whole grains. Eat 4-6 oz (112-168 g) of lean protein each day, such as lean meat, chicken, fish, eggs, or tofu. One ounce (oz) (28 g) of lean protein is equal to: 1 oz (28 g) of meat, chicken, or fish. 1 egg.  cup (62 g) of tofu. Eat some foods each day that contain healthy fats, such as avocado, nuts, seeds, and fish. What foods should I eat? Fruits Berries. Apples. Oranges. Peaches. Apricots. Plums. Grapes. Mangoes. Papayas. Pomegranates. Kiwi. Cherries. Vegetables Leafy greens, including lettuce, spinach, kale, chard, collard greens, mustard greens, and cabbage. Beets. Cauliflower. Broccoli. Carrots. Green beans. Tomatoes. Peppers. Onions. Cucumbers. Brussels sprouts. Grains Whole grains, such as whole-wheat or whole-grain bread, crackers, tortillas, cereal, and pasta. Unsweetened oatmeal. Quinoa. Brown or wild rice. Meats and other proteins Seafood. Poultry without skin. Lean cuts of poultry and beef. Tofu. Nuts. Seeds. Dairy Low-fat or fat-free dairy products such as milk, yogurt, and cheese. The items listed above may not be a complete list of foods and beverages you can eat and drink. Contact a dietitian for more information. What foods should I avoid? Fruits Fruits canned with syrup. Vegetables Canned vegetables. Frozen vegetables with butter or cream sauce. Grains Refined white flour and flour products such as bread, pasta, snack foods, and cereals. Avoid all processed foods. Meats and other proteins Fatty cuts of meat. Poultry with skin. Breaded or fried meats. Processed meat. Avoid saturated fats. Dairy Full-fat yogurt, cheese, or milk. Beverages Sweetened drinks, such as soda or iced tea. The items listed above  may not be a complete list of foods and beverages you should avoid. Contact a dietitian for more information. Questions to ask a health care provider Do I need to meet with a certified diabetes care and education specialist? Do I need to meet with a dietitian? What number can I call if I have questions? When are the best times to check my blood glucose? Where to find more information: American Diabetes Association: diabetes.org Academy of Nutrition and Dietetics: eatright.[employer] Corporationorg National Institute of Diabetes and Digestive and Kidney Diseases: StageSync.siniddk.nih.gov Association of Diabetes Care & Education Specialists: diabeteseducator.org Summary It is important to have healthy eating habits because your blood sugar (glucose) levels are greatly affected by what you eat and drink. It is important to use alcohol carefully. A healthy meal plan will help you manage your blood glucose and lower your risk of heart disease. Your health care provider may  recommend that you work with a dietitian to make a meal plan that is best for you. This information is not intended to replace advice given to you by your health care provider. Make sure you discuss any questions you have with your health care provider. Document Revised: 11/01/2019 Document Reviewed: 11/01/2019 Elsevier Patient Education  2022 [employer].

## 2021-05-23 ENCOUNTER — Telehealth: Payer: Self-pay

## 2021-05-23 NOTE — Telephone Encounter (Signed)
[facility]

## 2021-05-25 ENCOUNTER — Telehealth: Payer: Self-pay | Admitting: Nurse Practitioner

## 2021-05-25 NOTE — Telephone Encounter (Signed)
error 

## 2021-06-25 ENCOUNTER — Other Ambulatory Visit: Payer: Self-pay | Admitting: Nurse Practitioner

## 2021-06-25 MED ORDER — BASAGLAR KWIKPEN 100 UNIT/ML ~~LOC~~ SOPN: 30.0000 [IU] | PEN_INJECTOR | Freq: Two times a day (BID) | SUBCUTANEOUS | 0 refills | Status: DC

## 2021-06-25 NOTE — Progress Notes (Unsigned)
   [facility] [address] [place], [place]  [address] Phone:  [phone]   Fax:  [phone] 

## 2021-06-26 ENCOUNTER — Ambulatory Visit: Payer: Managed Care, Other (non HMO) | Admitting: Nurse Practitioner

## 2021-06-30 ENCOUNTER — Encounter: Payer: Self-pay | Admitting: Nurse Practitioner

## 2021-06-30 ENCOUNTER — Other Ambulatory Visit: Payer: Self-pay

## 2021-06-30 ENCOUNTER — Ambulatory Visit (INDEPENDENT_AMBULATORY_CARE_PROVIDER_SITE_OTHER): Payer: Managed Care, Other (non HMO) | Admitting: Nurse Practitioner

## 2021-06-30 VITALS — BP 144/105 | HR 82 | Temp 98.4°F | Ht 64.0 in | Wt 154.6 lb

## 2021-06-30 DIAGNOSIS — I160 Hypertensive urgency: Secondary | ICD-10-CM

## 2021-06-30 DIAGNOSIS — G40901 Epilepsy, unspecified, not intractable, with status epilepticus: Secondary | ICD-10-CM | POA: Diagnosis not present

## 2021-06-30 DIAGNOSIS — E1165 Type 2 diabetes mellitus with hyperglycemia: Secondary | ICD-10-CM

## 2021-06-30 MED ORDER — HYDRALAZINE HCL 10 MG PO TABS: ORAL_TABLET | ORAL | 0 refills | Status: DC

## 2021-06-30 MED ORDER — CLONIDINE HCL 0.1 MG PO TABS: 0.2000 mg | ORAL_TABLET | Freq: Once | ORAL | Status: DC

## 2021-06-30 NOTE — Patient Instructions (Signed)
Managing Your Hypertension Hypertension, also called high blood pressure, is when the force of the blood pressing against the walls of the arteries is too strong. Arteries are blood vessels that carry blood from your heart throughout your body. Hypertension forces the heart to work harder to pump blood and may cause the arteries to become narrow or stiff. Understanding blood pressure readings Your personal target blood pressure may vary depending on your medical conditions, your age, and other factors. A blood pressure reading includes a higher number over a lower number. Ideally, your blood pressure should be below 120/80. You should know that: The first, or top, number is called the systolic pressure. It is a measure of the pressure in your arteries as your heart beats. The second, or bottom number, is called the diastolic pressure. It is a measure of the pressure in your arteries as the heart relaxes. Blood pressure is classified into four stages. Based on your blood pressure reading, your health care provider may use the following stages to determine what type of treatment you need, if any. Systolic pressure and diastolic pressure are measured in a unit called mmHg. Normal Systolic pressure: below 120. Diastolic pressure: below 80. Elevated Systolic pressure: 120-129. Diastolic pressure: below 80. Hypertension stage 1 Systolic pressure: 130-139. Diastolic pressure: 80-89. Hypertension stage 2 Systolic pressure: 140 or above. Diastolic pressure: 90 or above. How can this condition affect me? Managing your hypertension is an important responsibility. Over time, hypertension can damage the arteries and decrease blood flow to important parts of the body, including the brain, heart, and kidneys. Having untreated or uncontrolled hypertension can lead to: A heart attack. A stroke. A weakened blood vessel (aneurysm). Heart failure. Kidney damage. Eye damage. Metabolic syndrome. Memory and  concentration problems. Vascular dementia. What actions can I take to manage this condition? Hypertension can be managed by making lifestyle changes and possibly by taking medicines. Your health care provider will help you make a plan to bring your blood pressure within a normal range. Nutrition  Eat a diet that is high in fiber and potassium, and low in salt (sodium), added sugar, and fat. An example eating plan is called the Dietary Approaches to Stop Hypertension (DASH) diet. To eat this way: Eat plenty of fresh fruits and vegetables. Try to fill one-half of your plate at each meal with fruits and vegetables. Eat whole grains, such as whole-wheat pasta, brown rice, or whole-grain bread. Fill about one-fourth of your plate with whole grains. Eat low-fat dairy products. Avoid fatty cuts of meat, processed or cured meats, and poultry with skin. Fill about one-fourth of your plate with lean proteins such as fish, chicken without skin, beans, eggs, and tofu. Avoid pre-made and processed foods. These tend to be higher in sodium, added sugar, and fat. Reduce your daily sodium intake. Most people with hypertension should eat less than 1,500 mg of sodium a day. Lifestyle  Work with your health care provider to maintain a healthy body weight or to lose weight. Ask what an ideal weight is for you. Get at least 30 minutes of exercise that causes your heart to beat faster (aerobic exercise) most days of the week. Activities may include walking, swimming, or biking. Include exercise to strengthen your muscles (resistance exercise), such as weight lifting, as part of your weekly exercise routine. Try to do these types of exercises for 30 minutes at least 3 days a week. Do not use any products that contain nicotine or tobacco, such as cigarettes, e-cigarettes,   and chewing tobacco. If you need help quitting, ask your health care provider. Control any long-term (chronic) conditions you have, such as high  cholesterol or diabetes. Identify your sources of stress and find ways to manage stress. This may include meditation, deep breathing, or making time for fun activities. Alcohol use Do not drink alcohol if: Your health care provider tells you not to drink. You are pregnant, may be pregnant, or are planning to become pregnant. If you drink alcohol: Limit how much you use to: 0-1 drink a day for women. 0-2 drinks a day for men. Be aware of how much alcohol is in your drink. In the [place] one drink equals one 12 oz bottle of beer (355 mL), one 5 oz glass of wine (148 mL), or one 1 oz glass of hard liquor (44 mL). Medicines Your health care provider may prescribe medicine if lifestyle changes are not enough to get your blood pressure under control and if: Your systolic blood pressure is 130 or higher. Your diastolic blood pressure is 80 or higher. Take medicines only as told by your health care provider. Follow the directions carefully. Blood pressure medicines must be taken as told by your health care provider. The medicine does not work as well when you skip doses. Skipping doses also puts you at risk for problems. Monitoring Before you monitor your blood pressure: Do not smoke, drink caffeinated beverages, or exercise within 30 minutes before taking a measurement. Use the bathroom and empty your bladder (urinate). Sit quietly for at least 5 minutes before taking measurements. Monitor your blood pressure at home as told by your health care provider. To do this: Sit with your back straight and supported. Place your feet flat on the floor. Do not cross your legs. Support your arm on a flat surface, such as a table. Make sure your upper arm is at heart level. Each time you measure, take two or three readings one minute apart and record the results. You may also need to have your blood pressure checked regularly by your health care provider. General information Talk with your health care  provider about your diet, exercise habits, and other lifestyle factors that may be contributing to hypertension. Review all the medicines you take with your health care provider because there may be side effects or interactions. Keep all visits as told by your health care provider. Your health care provider can help you create and adjust your plan for managing your high blood pressure. Where to find more information National Heart, Lung, and Blood Institute: www.nhlbi.nih.gov American Heart Association: www.heart.org Contact a health care provider if: You think you are having a reaction to medicines you have taken. You have repeated (recurrent) headaches. You feel dizzy. You have swelling in your ankles. You have trouble with your vision. Get help right away if: You develop a severe headache or confusion. You have unusual weakness or numbness, or you feel faint. You have severe pain in your chest or abdomen. You vomit repeatedly. You have trouble breathing. These symptoms may represent a serious problem that is an emergency. Do not wait to see if the symptoms will go away. Get medical help right away. Call your local emergency services (911 in the [place] Do not drive yourself to the hospital. Summary Hypertension is when the force of blood pumping through your arteries is too strong. If this condition is not controlled, it may put you at risk for serious complications. Your personal target blood pressure may vary depending on   your medical conditions, your age, and other factors. For most people, a normal blood pressure is less than 120/80. Hypertension is managed by lifestyle changes, medicines, or both. Lifestyle changes to help manage hypertension include losing weight, eating a healthy, low-sodium diet, exercising more, stopping smoking, and limiting alcohol. This information is not intended to replace advice given to you by your health care provider. Make sure you discuss any questions  you have with your health care provider. Document Revised: 04/17/2019 Document Reviewed: 02/28/2019 Elsevier Patient Education  2022 Elsevier Inc.  

## 2021-06-30 NOTE — Progress Notes (Signed)
? ?[facility] ?[place], [place]  [address] ?Phone:  [phone]   Fax:  [phone] ? ? ?Established Patient Office Visit ? ?Subjective:  ?Patient ID: Tricia Bridges, female    DOB: 17-Dec-1965  Age: 56 y.o. MRN: [id number] ? ?CC:  ?Chief Complaint  ?Patient presents with  ? Follow-up  ?  Patient is here today for her 2 month follow up visit with no concerns or issues to discuss.  ? ? ?HPI ?Tricia Bridges presents for follow up. She  has a past medical history of Diabetes mellitus without complication ([place]), Hypertension, Pre-diabetes, Seizures ([place]), and Stroke (cerebrum) ([place]) (09/30/2020).  ? ?Tricia Bridges is in today for 5 week diabetes follow up. The prescribed treatment is Lantus 30 units BID  along with statin therapy atorvastatin 40 mg . The reported use of treatment is consistent with prescribed. There are no reported side effects from the treatment. Home glucose monitoring indicates a CBG range of   109 to 140. Previous A1c 10.5% unable to complete POCT today.  ?Denies fever, chills, headache, dizziness, visual changes, polydipsia,  polyphagia shortness of breath, dyspnea on exertion, chest pain, abdominal pain, nausea, vomiting, polyuria, constipation, diarrhea,  any edema, numbness, tingling, burning of hand or feet.  ? ?Her BP is elevated today. She works full time nightshift  reports that she has been home. She feels ok. She denies any current symptoms. She has taken her medications as directed. However her BP is remaining elevated. She does monitor her BP at home and reports that it elevated at home.  ? ? ?Past Medical History:  ?Diagnosis Date  ? Diabetes mellitus without complication ([place])   ? Hypertension   ? Pre-diabetes   ? Seizures ([place])   ? Stroke (cerebrum) ([place]) 09/30/2020  ? ? ?Past Surgical History:  ?Procedure Laterality Date  ? CESAREAN SECTION    ? ? ?Family History  ?Problem Relation Age of Onset  ? Hypertension Mother   ? COPD Mother   ?     Smoker   ? Alcohol abuse Father   ? Hypertension Sister   ? ? ?Social History  ? ?Socioeconomic History  ? Marital status: Single  ?  Spouse name: Not on file  ? Number of children: Not on file  ? Years of education: Not on file  ? Highest education level: Not on file  ?Occupational History  ? Not on file  ?Tobacco Use  ? Smoking status: Never  ? Smokeless tobacco: Never  ?Vaping Use  ? Vaping Use: Never used  ?Substance and Sexual Activity  ? Alcohol use: No  ? Drug use: No  ? Sexual activity: Not Currently  ?Other Topics Concern  ? Not on file  ?Social History Narrative  ? Lives with daughter  ? Right Handed  ? Drinks caffeine occassionally  ? ?Social Determinants of Health  ? ?Financial Resource Strain: Not on file  ?Food Insecurity: Not on file  ?Transportation Needs: Not on file  ?Physical Activity: Not on file  ?Stress: Not on file  ?Social Connections: Not on file  ?Intimate Partner Violence: Not on file  ? ? ?Outpatient Medications Prior to Visit  ?Medication Sig Dispense Refill  ? amLODipine (NORVASC) 10 MG tablet Take 1 tablet (10 mg total) by mouth daily. 90 tablet 3  ? aspirin EC 81 MG tablet Take 1 tablet (81 mg total) by mouth daily. 90 tablet 3  ? atorvastatin (LIPITOR) 40 MG tablet Take 1  tablet (40 mg total) by mouth daily. 90 tablet 3  ? Cholecalciferol (VITAMIN D PO) Take 1 tablet by mouth daily.    ? enalapril (VASOTEC) 20 MG tablet Take 1 tablet (20 mg total) by mouth daily. 90 tablet 3  ? fluticasone (FLONASE) 50 MCG/ACT nasal spray Place 2 sprays into both nostrils daily. 16 g 6  ? glucose blood (ACCU-CHEK GUIDE) test strip Use as directed two time daily. 100 each 12  ? Insulin Glargine (BASAGLAR KWIKPEN) 100 UNIT/ML Inject 30 Units into the skin 2 (two) times daily. 54 mL 0  ? Insulin Pen Needle (PEN NEEDLES 31GX5/16") 31G X 8 MM MISC Use as directed 100 each 2  ? levETIRAcetam (KEPPRA) 500 MG tablet Take 1 tablet (500 mg total) by mouth 2 (two) times daily. 180 tablet 3  ? metFORMIN (GLUCOPHAGE-XR)  750 MG 24 hr tablet Take 1 tablet (750 mg total) by mouth daily with breakfast. 90 tablet 3  ? Vitamin E 400 units TABS Take 1 tablet (400 Units total) by mouth daily. 90 tablet 3  ? ?No facility-administered medications prior to visit.  ? ? ?Allergies  ?Allergen Reactions  ? Penicillins Itching  ?  Has patient had a PCN reaction causing immediate rash, facial/tongue/throat swelling, SOB or lightheadedness with hypotension: no ?Has patient had a PCN reaction causing severe rash involving mucus membranes or skin necrosis: no ?Has patient had a PCN reaction that required hospitalization: no ?Has patient had a PCN reaction occurring within the last 10 years: yes ?If all of the above answers are "NO", then may proceed with Cephalosporin use. ?  ? ? ?ROS ?Review of Systems ? ?  ?Objective:  ?  ?Physical Exam ?Constitutional:   ?   Appearance: She is normal weight.  ?HENT:  ?   Head: Normocephalic and atraumatic.  ?   Nose: Nose normal.  ?   Mouth/Throat:  ?   Mouth: Mucous membranes are moist.  ?Eyes:  ?   Pupils: Pupils are equal, round, and reactive to light.  ?Cardiovascular:  ?   Rate and Rhythm: Normal rate and regular rhythm.  ?   Pulses: Normal pulses.  ?   Heart sounds: Normal heart sounds.  ?Pulmonary:  ?   Effort: Pulmonary effort is normal.  ?   Breath sounds: Normal breath sounds.  ?Abdominal:  ?   General: Bowel sounds are normal. There is no distension.  ?   Palpations: Abdomen is soft. There is no mass.  ?   Tenderness: There is no abdominal tenderness.  ?Musculoskeletal:     ?   General: Normal range of motion.  ?   Cervical back: Normal range of motion and neck supple. No rigidity or tenderness.  ?Lymphadenopathy:  ?   Cervical: No cervical adenopathy.  ?Skin: ?   General: Skin is warm and dry.  ?   Capillary Refill: Capillary refill takes less than 2 seconds.  ?Neurological:  ?   General: No focal deficit present.  ?   Mental Status: She is alert and oriented to person, place, and time.  ?Psychiatric:      ?   Mood and Affect: Mood normal.     ?   Behavior: Behavior normal.     ?   Thought Content: Thought content normal.     ?   Judgment: Judgment normal.  ? ? ?BP (!) 144/105 (BP Location: Left Arm, Cuff Size: Normal)   Pulse 82   Temp 98.4 ?F (36.9 ?C)  Ht 5' 4"  (1.626 m)   Wt 154 lb 9.6 oz (70.1 kg)   SpO2 98%   BMI 26.54 kg/m?  ?Wt Readings from Last 3 Encounters:  ?06/30/21 154 lb 9.6 oz (70.1 kg)  ?05/02/21 155 lb 9.6 oz (70.6 kg)  ?04/02/21 150 lb (68 kg)  ? ? ? ?Health Maintenance Due  ?Topic Date Due  ? COVID-19 Vaccine (1) Never done  ? Zoster Vaccines- Shingrix (1 of 2) Never done  ? ? ?There are no preventive care reminders to display for this patient. ? ?Lab Results  ?Component Value Date  ? TSH 0.59 08/13/2017  ? ?Lab Results  ?Component Value Date  ? WBC 11.4 (H) 11/17/2020  ? HGB 13.9 11/17/2020  ? HCT 41.0 11/17/2020  ? MCV 88.4 11/17/2020  ? PLT 203 11/17/2020  ? ?Lab Results  ?Component Value Date  ? NA 141 03/21/2021  ? K 4.1 03/21/2021  ? CO2 24 11/17/2020  ? GLUCOSE 132 (H) 03/21/2021  ? BUN 9 03/21/2021  ? CREATININE 0.74 03/21/2021  ? BILITOT 0.3 03/21/2021  ? ALKPHOS 115 03/21/2021  ? AST 15 03/21/2021  ? ALT 28 11/17/2020  ? PROT 7.2 03/21/2021  ? ALBUMIN 4.7 03/21/2021  ? CALCIUM 9.7 03/21/2021  ? ANIONGAP 12 11/17/2020  ? EGFR 94 03/21/2021  ? GFR 89.52 02/25/2018  ? ?Lab Results  ?Component Value Date  ? CHOL 187 03/21/2021  ? ?Lab Results  ?Component Value Date  ? HDL 56 03/21/2021  ? ?Lab Results  ?Component Value Date  ? LDLCALC 113 (H) 03/21/2021  ? ?Lab Results  ?Component Value Date  ? TRIG 98 03/21/2021  ? ?Lab Results  ?Component Value Date  ? CHOLHDL 3.3 03/21/2021  ? ?Lab Results  ?Component Value Date  ? HGBA1C 10.5 (A) 05/02/2021  ? HGBA1C 10.5 05/02/2021  ? HGBA1C 10.5 (A) 05/02/2021  ? HGBA1C 10.5 (A) 05/02/2021  ? ? ?  ?Assessment & Plan:  ? ?Problem List Items Addressed This Visit   ? ?  ? Endocrine  ? Type 2 diabetes mellitus with hyperglycemia, without long-term  current use of insulin ([place]) ?A1c pending ?Encourage compliance with current treatment regimen  Dose adjustment none due to home CBG reports ?Encourage regular CBG monitoring ?Encourage contacting office if

## 2021-07-01 LAB — HEMOGLOBIN A1C
Est. average glucose Bld gHb Est-mCnc: 278 mg/dL
Hgb A1c MFr Bld: 11.3 % — ABNORMAL HIGH (ref 4.8–5.6)

## 2021-08-05 ENCOUNTER — Ambulatory Visit (INDEPENDENT_AMBULATORY_CARE_PROVIDER_SITE_OTHER): Payer: Managed Care, Other (non HMO) | Admitting: Nurse Practitioner

## 2021-08-05 ENCOUNTER — Encounter: Payer: Self-pay | Admitting: Nurse Practitioner

## 2021-08-05 VITALS — BP 159/95 | HR 70 | Temp 98.4°F | Ht 64.0 in | Wt 158.4 lb

## 2021-08-05 DIAGNOSIS — I10 Essential (primary) hypertension: Secondary | ICD-10-CM

## 2021-08-05 MED ORDER — LISINOPRIL-HYDROCHLOROTHIAZIDE 20-25 MG PO TABS: 1.0000 | ORAL_TABLET | Freq: Every day | ORAL | 3 refills | Status: DC

## 2021-08-05 NOTE — Patient Instructions (Signed)
You were seen today in the [facility] for reevaluation of HTN. You were prescribed medications, please take as directed. Please follow up in 1 mth for reevaluation of HTN and weight.  ?

## 2021-08-05 NOTE — Progress Notes (Signed)
? ?[place], [place]  [address] ?Phone:  [phone]   Fax:  [phone] ?Subjective:  ? Patient ID: Tricia Bridges, female    DOB: 01-14-1966, 56 y.o.   MRN: [id number] ? ?Chief Complaint  ?Patient presents with  ? Follow-up  ?  Pt is here for 4 week BP follow up visit.  ? ?HPI ?Tricia Bridges 56 y.o. 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 To the [facility] for reevaluation of HTN. ? ?Hypertension: Patient here for follow-up of elevated blood pressure. Tricia Bridges  exercising and is not adherent to low salt diet.  Blood pressure is not well controlled at home. Cardiac symptoms none. Patient denies none.  Cardiovascular risk factors: hypertension and sedentary lifestyle. Use of agents associated with hypertension: none. History of target organ damage: none. B/P at home 140/?. ? ?Denies any other concerns today. Denies any fatigue, chest pain, shortness of breath, HA or dizziness. Denies any blurred vision, numbness or tingling. ? ? ?Past Medical History:  ?Diagnosis Date  ? Diabetes mellitus without complication ([place])   ? Hypertension   ? Pre-diabetes   ? Seizures ([place])   ? Stroke (cerebrum) ([place]) 09/30/2020  ? ? ?Past Surgical History:  ?Procedure Laterality Date  ? CESAREAN SECTION    ? EYE SURGERY    ? ? ?Family History  ?Problem Relation Age of Onset  ? Hypertension Mother   ? COPD Mother   ?     Smoker  ? Alcohol abuse Father   ? Hypertension Sister   ? ? ?Social History  ? ?Socioeconomic History  ? Marital status: Single  ?  Spouse name: Not on file  ? Number of children: Not on file  ? Years of education: Not on file  ? Highest education level: Not on file  ?Occupational History  ? Not on file  ?Tobacco Use  ? Smoking status: Never  ? Smokeless tobacco: Never  ?Vaping Use  ? Vaping Use: Never used  ?Substance and Sexual Activity  ? Alcohol  use: No  ? Drug use: No  ? Sexual activity: Not Currently  ?Other Topics Concern  ? Not on file  ?Social History Narrative  ? Lives with daughter  ? Right Handed  ? Drinks caffeine occassionally  ? ?Social Determinants of Health  ? ?Financial Resource Strain: Not on file  ?Food Insecurity: Not on file  ?Transportation Needs: Not on file  ?Physical Activity: Not on file  ?Stress: Not on file  ?Social Connections: Not on file  ?Intimate Partner Violence: Not on file  ? ? ?Outpatient Medications Prior to Visit  ?Medication Sig Dispense Refill  ? amLODipine (NORVASC) 10 MG tablet Take 1 tablet (10 mg total) by mouth daily. 90 tablet 3  ? aspirin EC 81 MG tablet Take 1 tablet (81 mg total) by mouth daily. 90 tablet 3  ? atorvastatin (LIPITOR) 40 MG tablet Take 1 tablet (40 mg total) by mouth daily. 90 tablet 3  ? Cholecalciferol (VITAMIN D PO) Take 1 tablet by mouth daily.    ? fluticasone (FLONASE) 50 MCG/ACT nasal spray Place 2 sprays into both nostrils daily. 16 g 6  ? glucose blood (ACCU-CHEK GUIDE) test strip Use as directed two time daily. 100 each 12  ? hydrALAZINE (APRESOLINE) 10 MG tablet Take 1 tablet (10 mg total) by mouth daily for 7 days, THEN 1 tablet (  10 mg total) 2 (two) times daily for 7 days, THEN 1 tablet (10 mg total) 3 (three) times daily. 201 tablet 0  ? Insulin Glargine (BASAGLAR KWIKPEN) 100 UNIT/ML Inject 30 Units into the skin 2 (two) times daily. 54 mL 0  ? Insulin Pen Needle (PEN NEEDLES 31GX5/16") 31G X 8 MM MISC Use as directed 100 each 2  ? levETIRAcetam (KEPPRA) 500 MG tablet Take 1 tablet (500 mg total) by mouth 2 (two) times daily. 180 tablet 3  ? metFORMIN (GLUCOPHAGE-XR) 750 MG 24 hr tablet Take 1 tablet (750 mg total) by mouth daily with breakfast. 90 tablet 3  ? Vitamin E 400 units TABS Take 1 tablet (400 Units total) by mouth daily. 90 tablet 3  ? enalapril (VASOTEC) 10 MG tablet Take 10 mg by mouth daily.    ? enalapril (VASOTEC) 20 MG tablet Take 1 tablet (20 mg total) by mouth  daily. 90 tablet 3  ? ?Facility-Administered Medications Prior to Visit  ?Medication Dose Route Frequency Provider Last Rate Last Admin  ? cloNIDine (CATAPRES) tablet 0.2 mg  0.2 mg Oral Once Vevelyn Francois, NP      ? ? ?Allergies  ?Allergen Reactions  ? Penicillins Itching  ?  Has patient had a PCN reaction causing immediate rash, facial/tongue/throat swelling, SOB or lightheadedness with hypotension: no ?Has patient had a PCN reaction causing severe rash involving mucus membranes or skin necrosis: no ?Has patient had a PCN reaction that required hospitalization: no ?Has patient had a PCN reaction occurring within the last 10 years: yes ?If all of the above answers are "NO", then may proceed with Cephalosporin use. ?  ? ? ?Review of Systems  ?Constitutional: Negative.  Negative for chills, fever and malaise/fatigue.  ?HENT: Negative.    ?Respiratory:  Negative for cough and shortness of breath.   ?Cardiovascular:  Negative for chest pain, palpitations and leg swelling.  ?Gastrointestinal:  Negative for abdominal pain, blood in stool, constipation, diarrhea, nausea and vomiting.  ?Skin: Negative.   ?Neurological: Negative.   ?Psychiatric/Behavioral:  Negative for depression. The patient is not nervous/anxious.   ?All other systems reviewed and are negative. ? ?   ?Objective:  ?  ?Physical Exam ?Vitals reviewed.  ?Constitutional:   ?   General: Tricia Bridges is not in acute distress. ?   Appearance: Normal appearance. Tricia Bridges is normal weight.  ?HENT:  ?   Head: Normocephalic.  ?Cardiovascular:  ?   Rate and Rhythm: Normal rate and regular rhythm.  ?   Pulses: Normal pulses.  ?   Heart sounds: Normal heart sounds.  ?   Comments: No obvious peripheral edema ?Pulmonary:  ?   Effort: Pulmonary effort is normal.  ?   Breath sounds: Normal breath sounds.  ?Musculoskeletal:     ?   General: No swelling, tenderness, deformity or signs of injury. Normal range of motion.  ?   Right lower leg: No edema.  ?   Left lower leg: No edema.   ?Skin: ?   General: Skin is warm and dry.  ?   Capillary Refill: Capillary refill takes less than 2 seconds.  ?Neurological:  ?   General: No focal deficit present.  ?   Mental Status: Tricia Bridges is alert and oriented to person, place, and time.  ?Psychiatric:     ?   Mood and Affect: Mood normal.     ?   Behavior: Behavior normal.     ?   Thought Content: Thought content normal.     ?  Judgment: Judgment normal.  ? ? ?BP (!) 159/95 (BP Location: Left Arm, Cuff Size: Normal)   Pulse 70   Temp 98.4 ?F (36.9 ?C)   Ht 5' 4"  (1.626 m)   Wt 158 lb 6 oz (71.8 kg)   SpO2 100%   BMI 27.19 kg/m?  ?Wt Readings from Last 3 Encounters:  ?08/05/21 158 lb 6 oz (71.8 kg)  ?06/30/21 154 lb 9.6 oz (70.1 kg)  ?05/02/21 155 lb 9.6 oz (70.6 kg)  ? ? ?Immunization History  ?Administered Date(s) Administered  ? Influenza,inj,Quad PF,6+ Mos 01/29/2016, 02/25/2018  ? Pneumococcal Polysaccharide-23 08/13/2017  ? Tdap 08/13/2017  ? ? ?Diabetic Foot Exam - Simple   ?No data filed ?  ? ? ?Lab Results  ?Component Value Date  ? TSH 0.59 08/13/2017  ? ?Lab Results  ?Component Value Date  ? WBC 11.4 (H) 11/17/2020  ? HGB 13.9 11/17/2020  ? HCT 41.0 11/17/2020  ? MCV 88.4 11/17/2020  ? PLT 203 11/17/2020  ? ?Lab Results  ?Component Value Date  ? NA 141 03/21/2021  ? K 4.1 03/21/2021  ? CO2 24 11/17/2020  ? GLUCOSE 132 (H) 03/21/2021  ? BUN 9 03/21/2021  ? CREATININE 0.74 03/21/2021  ? BILITOT 0.3 03/21/2021  ? ALKPHOS 115 03/21/2021  ? AST 15 03/21/2021  ? ALT 28 11/17/2020  ? PROT 7.2 03/21/2021  ? ALBUMIN 4.7 03/21/2021  ? CALCIUM 9.7 03/21/2021  ? ANIONGAP 12 11/17/2020  ? EGFR 94 03/21/2021  ? GFR 89.52 02/25/2018  ? ?Lab Results  ?Component Value Date  ? CHOL 187 03/21/2021  ? CHOL 245 (H) 09/19/2020  ? CHOL 163 11/25/2017  ? ?Lab Results  ?Component Value Date  ? HDL 56 03/21/2021  ? HDL 47 09/19/2020  ? HDL 54.00 11/25/2017  ? ?Lab Results  ?Component Value Date  ? LDLCALC 113 (H) 03/21/2021  ? LDLCALC 179 (H) 09/19/2020  ? [place] 97  11/25/2017  ? ?Lab Results  ?Component Value Date  ? TRIG 98 03/21/2021  ? TRIG 97 09/19/2020  ? TRIG 116 09/17/2020  ? ?Lab Results  ?Component Value Date  ? CHOLHDL 3.3 03/21/2021  ? CHOLHDL 5.2 09/19/2020  ? CHOLHDL

## 2021-08-29 ENCOUNTER — Other Ambulatory Visit: Payer: Self-pay

## 2021-08-29 ENCOUNTER — Ambulatory Visit: Payer: Medicaid Other | Admitting: Nurse Practitioner

## 2021-08-29 MED ORDER — AMLODIPINE BESYLATE 10 MG PO TABS: 10.0000 mg | ORAL_TABLET | Freq: Every day | ORAL | 3 refills | Status: DC

## 2021-09-05 ENCOUNTER — Encounter: Payer: Self-pay | Admitting: Nurse Practitioner

## 2021-09-05 ENCOUNTER — Ambulatory Visit (INDEPENDENT_AMBULATORY_CARE_PROVIDER_SITE_OTHER): Payer: Commercial Managed Care - HMO | Admitting: Nurse Practitioner

## 2021-09-05 VITALS — BP 130/92 | HR 92 | Temp 98.2°F | Ht 64.0 in | Wt 158.0 lb

## 2021-09-05 DIAGNOSIS — I639 Cerebral infarction, unspecified: Secondary | ICD-10-CM | POA: Diagnosis not present

## 2021-09-05 DIAGNOSIS — I10 Essential (primary) hypertension: Secondary | ICD-10-CM | POA: Diagnosis not present

## 2021-09-05 DIAGNOSIS — E1165 Type 2 diabetes mellitus with hyperglycemia: Secondary | ICD-10-CM

## 2021-09-05 LAB — POCT GLYCOSYLATED HEMOGLOBIN (HGB A1C)
HbA1c POC (<> result, manual entry): 9.7 % (ref 4.0–5.6)
HbA1c, POC (controlled diabetic range): 9.7 % — AB (ref 0.0–7.0)
HbA1c, POC (prediabetic range): 9.7 % — AB (ref 5.7–6.4)
Hemoglobin A1C: 9.7 % — AB (ref 4.0–5.6)

## 2021-09-05 MED ORDER — ASPIRIN 81 MG PO TBEC: 81.0000 mg | DELAYED_RELEASE_TABLET | Freq: Every day | ORAL | 12 refills | Status: AC

## 2021-09-05 MED ORDER — GLIPIZIDE 5 MG PO TABS: 5.0000 mg | ORAL_TABLET | Freq: Two times a day (BID) | ORAL | 3 refills | Status: DC

## 2021-09-05 NOTE — Patient Instructions (Signed)
You were seen today in the [facility] for reevaluation of HTN/DM/weight . Labs were collected, results will be available via MyChart or, if abnormal, you will be contacted by clinic staff. You were prescribed medications, please take as directed. Please follow up in 1 mth for reevaluation weight and DM.

## 2021-09-05 NOTE — Progress Notes (Unsigned)
[place], [place]  [address] Phone:  [phone]   Fax:  [phone] Subjective:   Patient ID: Tricia Bridges, female    DOB: [birth year], 56 y.o.   MRN: [id number]  Chief Complaint  Patient presents with   Follow-up    1 month follow up; weight and hypertension Patient states she just wants to discuss some diet plans. Patient also wants to adjust or stop the metformin because she doesn't like the way it makes her feel.    HPI Tricia Bridges 56 y.o. female  has a past medical history of Diabetes mellitus without complication ([place]), Hypertension, Pre-diabetes, Seizures ([place]), and Stroke (cerebrum) ([place]) (09/30/2020). To the [facility] for reevaluation of HTN and DM.  Hypertension: Patient here for follow-up of elevated blood pressure. She is exercising and is adherent to low salt diet.  Blood pressure is well controlled at home. Cardiac symptoms {Symptoms; cardiac:12860}. Patient denies {Symptoms; cardiac:12860}.  Cardiovascular risk factors: {cv risk factors:510}. Use of agents associated with hypertension: {bp agents assoc with hypertension:511::"none"}. History of target organ damage: {target [phone]}. Checks B/P at home, similar to today's values. Denies any other concerns today.   Diabetes Mellitus: Patient presents for follow up of diabetes. Symptoms: none. Denies any symptoms. Patient denies {dm sx:14075}.  Evaluation to date has been included: {dm labs:[phone]}.  Home sugars: patient does not check sugars. Treatment to date: {dm interventions:14074}.   Denies any fatigue, chest pain, shortness of breath, HA or dizziness. Denies any blurred vision, numbness or tingling.    Past Medical History:  Diagnosis Date   Diabetes mellitus without complication ([place])    Hypertension    Pre-diabetes    Seizures ([place])    Stroke (cerebrum) ([place]) 09/30/2020    Past Surgical History:  Procedure Laterality Date   CESAREAN SECTION      EYE SURGERY      Family History  Problem Relation Age of Onset   Hypertension Mother    COPD Mother        Smoker   Alcohol abuse Father    Hypertension Sister     Social History   Socioeconomic History   Marital status: Single    Spouse name: Not on file   Number of children: Not on file   Years of education: Not on file   Highest education level: Not on file  Occupational History   Not on file  Tobacco Use   Smoking status: Never   Smokeless tobacco: Never  Vaping Use   Vaping Use: Never used  Substance and Sexual Activity   Alcohol use: No   Drug use: No   Sexual activity: Not Currently  Other Topics Concern   Not on file  Social History Narrative   Lives with daughter   Right Handed   Drinks caffeine occassionally   Social Determinants of Health   Financial Resource Strain: Not on file  Food Insecurity: Not on file  Transportation Needs: Not on file  Physical Activity: Not on file  Stress: Not on file  Social Connections: Not on file  Intimate Partner Violence: Not on file    Outpatient Medications Prior to Visit  Medication Sig Dispense Refill   amLODipine (NORVASC) 10 MG tablet Take 1 tablet (10 mg total) by mouth daily. 90 tablet 3   aspirin EC 81 MG tablet Take 1 tablet (81 mg total) by mouth daily. 90 tablet 3   atorvastatin (LIPITOR) 40 MG tablet Take 1  tablet (40 mg total) by mouth daily. 90 tablet 3   Cholecalciferol (VITAMIN D PO) Take 1 tablet by mouth daily.     fluticasone (FLONASE) 50 MCG/ACT nasal spray Place 2 sprays into both nostrils daily. 16 g 6   glucose blood (ACCU-CHEK GUIDE) test strip Use as directed two time daily. 100 each 12   hydrALAZINE (APRESOLINE) 10 MG tablet Take 1 tablet (10 mg total) by mouth daily for 7 days, THEN 1 tablet (10 mg total) 2 (two) times daily for 7 days, THEN 1 tablet (10 mg total) 3 (three) times daily. 201 tablet 0   Insulin Glargine (BASAGLAR KWIKPEN) 100 UNIT/ML Inject 30 Units into the skin 2 (two)  times daily. 54 mL 0   Insulin Pen Needle (PEN NEEDLES 31GX5/16") 31G X 8 MM MISC Use as directed 100 each 2   levETIRAcetam (KEPPRA) 500 MG tablet Take 1 tablet (500 mg total) by mouth 2 (two) times daily. 180 tablet 3   lisinopril-hydrochlorothiazide (ZESTORETIC) 20-25 MG tablet Take 1 tablet by mouth daily. 90 tablet 3   metFORMIN (GLUCOPHAGE-XR) 750 MG 24 hr tablet Take 1 tablet (750 mg total) by mouth daily with breakfast. 90 tablet 3   Vitamin E 400 units TABS Take 1 tablet (400 Units total) by mouth daily. 90 tablet 3   Facility-Administered Medications Prior to Visit  Medication Dose Route Frequency Provider Last Rate Last Admin   cloNIDine (CATAPRES) tablet 0.2 mg  0.2 mg Oral Once Vevelyn Francois, NP        Allergies  Allergen Reactions   Penicillins Itching    Has patient had a PCN reaction causing immediate rash, facial/tongue/throat swelling, SOB or lightheadedness with hypotension: no Has patient had a PCN reaction causing severe rash involving mucus membranes or skin necrosis: no Has patient had a PCN reaction that required hospitalization: no Has patient had a PCN reaction occurring within the last 10 years: yes If all of the above answers are "NO", then may proceed with Cephalosporin use.     ROS     Objective:    Physical Exam  BP (!) 130/92   Pulse 92   Temp 98.2 F (36.8 C)   Ht $R'5\' 4"'qY$  (1.626 m)   Wt 158 lb (71.7 kg)   SpO2 100%   BMI 27.12 kg/m  Wt Readings from Last 3 Encounters:  09/05/21 158 lb (71.7 kg)  08/05/21 158 lb 6 oz (71.8 kg)  06/30/21 154 lb 9.6 oz (70.1 kg)    Immunization History  Administered Date(s) Administered   Influenza,inj,Quad PF,6+ Mos 01/29/2016, 02/25/2018   Pneumococcal Polysaccharide-23 08/13/2017   Tdap 08/13/2017    Diabetic Foot Exam - Simple   No data filed     Lab Results  Component Value Date   TSH 0.59 08/13/2017   Lab Results  Component Value Date   WBC 11.4 (H) 11/17/2020   HGB 13.9 11/17/2020    HCT 41.0 11/17/2020   MCV 88.4 11/17/2020   PLT 203 11/17/2020   Lab Results  Component Value Date   NA 141 03/21/2021   K 4.1 03/21/2021   CO2 24 11/17/2020   GLUCOSE 132 (H) 03/21/2021   BUN 9 03/21/2021   CREATININE 0.74 03/21/2021   BILITOT 0.3 03/21/2021   ALKPHOS 115 03/21/2021   AST 15 03/21/2021   ALT 28 11/17/2020   PROT 7.2 03/21/2021   ALBUMIN 4.7 03/21/2021   CALCIUM 9.7 03/21/2021   ANIONGAP 12 11/17/2020   EGFR 94 03/21/2021  GFR 89.52 02/25/2018   Lab Results  Component Value Date   CHOL 187 03/21/2021   CHOL 245 (H) 09/19/2020   CHOL 163 11/25/2017   Lab Results  Component Value Date   HDL 56 03/21/2021   HDL 47 09/19/2020   HDL 54.00 11/25/2017   Lab Results  Component Value Date   LDLCALC 113 (H) 03/21/2021   LDLCALC 179 (H) 09/19/2020   LDLCALC 97 11/25/2017   Lab Results  Component Value Date   TRIG 98 03/21/2021   TRIG 97 09/19/2020   TRIG 116 09/17/2020   Lab Results  Component Value Date   CHOLHDL 3.3 03/21/2021   CHOLHDL 5.2 09/19/2020   CHOLHDL 3 11/25/2017   Lab Results  Component Value Date   HGBA1C 11.3 (H) 06/30/2021   HGBA1C 10.5 (A) 05/02/2021   HGBA1C 10.5 05/02/2021   HGBA1C 10.5 (A) 05/02/2021   HGBA1C 10.5 (A) 05/02/2021       Assessment & Plan:   Problem List Items Addressed This Visit       Endocrine   Type 2 diabetes mellitus with hyperglycemia, without long-term current use of insulin ([place]) - Primary   Relevant Orders   HgB A1c    I am having Alis R. Linthicum maintain her Cholecalciferol (VITAMIN D PO), aspirin EC, fluticasone, atorvastatin, metFORMIN, Accu-Chek Guide, Vitamin E, levETIRAcetam, PEN NEEDLES 31GX5/16", Basaglar KwikPen, hydrALAZINE, lisinopril-hydrochlorothiazide, and amLODipine. We will continue to administer cloNIDine.  No orders of the defined types were placed in this encounter.    Teena Dunk, NP

## 2021-09-15 ENCOUNTER — Other Ambulatory Visit: Payer: Self-pay

## 2021-09-15 MED ORDER — BASAGLAR KWIKPEN 100 UNIT/ML ~~LOC~~ SOPN: 30.0000 [IU] | PEN_INJECTOR | Freq: Two times a day (BID) | SUBCUTANEOUS | 0 refills | Status: DC

## 2021-09-15 MED ORDER — HYDRALAZINE HCL 10 MG PO TABS: ORAL_TABLET | ORAL | 0 refills | Status: DC

## 2021-10-01 ENCOUNTER — Ambulatory Visit: Payer: Commercial Managed Care - HMO | Admitting: Neurology

## 2021-10-01 ENCOUNTER — Telehealth: Payer: Self-pay | Admitting: Neurology

## 2021-10-01 NOTE — Telephone Encounter (Signed)
Pt cancelled appt due to having a family emergency, will call back to reschedule.

## 2021-10-07 ENCOUNTER — Encounter: Payer: Self-pay | Admitting: Nurse Practitioner

## 2021-10-07 ENCOUNTER — Ambulatory Visit (INDEPENDENT_AMBULATORY_CARE_PROVIDER_SITE_OTHER): Payer: Commercial Managed Care - HMO | Admitting: Nurse Practitioner

## 2021-10-07 VITALS — BP 140/87 | HR 84 | Temp 97.8°F | Ht 64.0 in | Wt 158.8 lb

## 2021-10-07 DIAGNOSIS — I639 Cerebral infarction, unspecified: Secondary | ICD-10-CM | POA: Diagnosis not present

## 2021-10-07 DIAGNOSIS — G40901 Epilepsy, unspecified, not intractable, with status epilepticus: Secondary | ICD-10-CM | POA: Diagnosis not present

## 2021-10-07 DIAGNOSIS — T733XXA Exhaustion due to excessive exertion, initial encounter: Secondary | ICD-10-CM

## 2021-10-07 NOTE — Progress Notes (Signed)
[place],   [id number] Phone:  [phone]   Fax:  [phone] Subjective:   Patient ID: Tricia Bridges, female    DOB: [birth year], 56 y.o.   MRN: [id number]  Chief Complaint  Patient presents with   Follow-up    e   HPI Tricia Bridges 56 y.o. female  has a past medical history of Diabetes mellitus without complication ([place]), Hypertension, Pre-diabetes, Seizures ([place]), and Stroke (cerebrum) ([place]) (09/30/2020). To the [facility] for reevaluation of HTN and weight loss.   Patient states that she was stressed and over whelmed at work. Has been having increased fatigue and exhaustion, predominantly at work. Requesting documentation for light duty. Her current job is working in [employer], and they were given documentation for light duty last year after patient had stroke. Denies any other concerns today.   Denies any fatigue, chest pain, shortness of breath, HA or dizziness. Denies any blurred vision, numbness or tingling.  Past Medical History:  Diagnosis Date   Diabetes mellitus without complication ([place])    Hypertension    Pre-diabetes    Seizures ([place])    Stroke (cerebrum) ([place]) 09/30/2020    Past Surgical History:  Procedure Laterality Date   CESAREAN SECTION     EYE SURGERY      Family History  Problem Relation Age of Onset   Hypertension Mother    COPD Mother        Smoker   Alcohol abuse Father    Hypertension Sister     Social History   Socioeconomic History   Marital status: Single    Spouse name: Not on file   Number of children: Not on file   Years of education: Not on file   Highest education level: Not on file  Occupational History   Not on file  Tobacco Use   Smoking status: Never   Smokeless tobacco: Never  Vaping Use   Vaping Use: Never used  Substance and Sexual Activity   Alcohol use: No   Drug use: No   Sexual activity: Not Currently  Other Topics Concern   Not on file  Social History  Narrative   Lives with daughter   Right Handed   Drinks caffeine occassionally   Social Determinants of Health   Financial Resource Strain: Not on file  Food Insecurity: Not on file  Transportation Needs: Not on file  Physical Activity: Not on file  Stress: Not on file  Social Connections: Not on file  Intimate Partner Violence: Not on file    Outpatient Medications Prior to Visit  Medication Sig Dispense Refill   amLODipine (NORVASC) 10 MG tablet Take 1 tablet (10 mg total) by mouth daily. 90 tablet 3   aspirin EC 81 MG tablet Take 1 tablet (81 mg total) by mouth daily. Swallow whole. 30 tablet 12   atorvastatin (LIPITOR) 40 MG tablet Take 1 tablet (40 mg total) by mouth daily. 90 tablet 3   Cholecalciferol (VITAMIN D PO) Take 1 tablet by mouth daily.     enalapril (VASOTEC) 10 MG tablet Take 10 mg by mouth daily.     fluticasone (FLONASE) 50 MCG/ACT nasal spray Place 2 sprays into both nostrils daily. 16 g 6   glipiZIDE (GLUCOTROL) 5 MG tablet Take 1 tablet (5 mg total) by mouth 2 (two) times daily before a meal. 60 tablet 3   glucose blood (ACCU-CHEK GUIDE) test strip Use as directed two time daily.  100 each 12   hydrALAZINE (APRESOLINE) 10 MG tablet Take 1 tablet (10 mg total) by mouth daily for 7 days, THEN 1 tablet (10 mg total) 2 (two) times daily for 7 days, THEN 1 tablet (10 mg total) 3 (three) times daily. 201 tablet 0   Insulin Glargine (BASAGLAR KWIKPEN) 100 UNIT/ML Inject 30 Units into the skin 2 (two) times daily. 54 mL 0   Insulin Pen Needle (PEN NEEDLES 31GX5/16") 31G X 8 MM MISC Use as directed 100 each 2   levETIRAcetam (KEPPRA) 500 MG tablet Take 1 tablet (500 mg total) by mouth 2 (two) times daily. 180 tablet 3   lisinopril-hydrochlorothiazide (ZESTORETIC) 20-25 MG tablet Take 1 tablet by mouth daily. 90 tablet 3   metFORMIN (GLUCOPHAGE-XR) 750 MG 24 hr tablet Take 1 tablet (750 mg total) by mouth daily with breakfast. 90 tablet 3   Vitamin E 400 units TABS Take 1  tablet (400 Units total) by mouth daily. 90 tablet 3   Facility-Administered Medications Prior to Visit  Medication Dose Route Frequency Provider Last Rate Last Admin   cloNIDine (CATAPRES) tablet 0.2 mg  0.2 mg Oral Once Vevelyn Francois, NP        Allergies  Allergen Reactions   Penicillins Itching    Has patient had a PCN reaction causing immediate rash, facial/tongue/throat swelling, SOB or lightheadedness with hypotension: no Has patient had a PCN reaction causing severe rash involving mucus membranes or skin necrosis: no Has patient had a PCN reaction that required hospitalization: no Has patient had a PCN reaction occurring within the last 10 years: yes If all of the above answers are "NO", then may proceed with Cephalosporin use.     Review of Systems  Constitutional:  Positive for malaise/fatigue. Negative for chills and fever.       See HPI  Respiratory:  Negative for cough and shortness of breath.   Cardiovascular:  Negative for chest pain, palpitations and leg swelling.  Gastrointestinal:  Negative for abdominal pain, blood in stool, constipation, diarrhea, nausea and vomiting.  Musculoskeletal: Negative.   Skin: Negative.   Neurological: Negative.   Psychiatric/Behavioral:  Negative for depression. The patient is not nervous/anxious.   All other systems reviewed and are negative.      Objective:    Physical Exam Vitals reviewed.  Constitutional:      General: She is not in acute distress.    Appearance: Normal appearance.  HENT:     Head: Normocephalic.  Neck:     Vascular: No carotid bruit.  Cardiovascular:     Rate and Rhythm: Normal rate and regular rhythm.     Pulses: Normal pulses.     Heart sounds: Normal heart sounds.     Comments: No obvious peripheral edema Pulmonary:     Effort: Pulmonary effort is normal.     Breath sounds: Normal breath sounds.  Musculoskeletal:        General: No swelling, tenderness, deformity or signs of injury. Normal  range of motion.     Cervical back: Normal range of motion and neck supple. No rigidity or tenderness.     Right lower leg: No edema.     Left lower leg: No edema.  Lymphadenopathy:     Cervical: No cervical adenopathy.  Skin:    General: Skin is warm and dry.     Capillary Refill: Capillary refill takes less than 2 seconds.  Neurological:     General: No focal deficit present.  Mental Status: She is alert and oriented to person, place, and time.  Psychiatric:        Mood and Affect: Mood normal.        Behavior: Behavior normal.        Thought Content: Thought content normal.        Judgment: Judgment normal.     BP 140/87 (BP Location: Right Arm, Patient Position: Sitting, Cuff Size: Normal)   Pulse 84   Temp 97.8 F (36.6 C) (Temporal)   Ht 5' 4"  (1.626 m)   Wt 158 lb 12.8 oz (72 kg)   SpO2 100%   BMI 27.26 kg/m  Wt Readings from Last 3 Encounters:  10/07/21 158 lb 12.8 oz (72 kg)  09/05/21 158 lb (71.7 kg)  08/05/21 158 lb 6 oz (71.8 kg)    Immunization History  Administered Date(s) Administered   Influenza,inj,Quad PF,6+ Mos 01/29/2016, 02/25/2018   Pneumococcal Polysaccharide-23 08/13/2017   Tdap 08/13/2017    Diabetic Foot Exam - Simple   No data filed     Lab Results  Component Value Date   TSH 0.59 08/13/2017   Lab Results  Component Value Date   WBC 8.3 10/07/2021   HGB 13.7 10/07/2021   HCT 39.2 10/07/2021   MCV 85 10/07/2021   PLT 245 10/07/2021   Lab Results  Component Value Date   NA 143 10/07/2021   K 3.6 10/07/2021   CO2 24 10/07/2021   GLUCOSE 151 (H) 10/07/2021   BUN 13 10/07/2021   CREATININE 1.02 (H) 10/07/2021   BILITOT 0.3 10/07/2021   ALKPHOS 101 10/07/2021   AST 23 10/07/2021   ALT 34 (H) 10/07/2021   PROT 7.0 10/07/2021   ALBUMIN 4.3 10/07/2021   CALCIUM 9.7 10/07/2021   ANIONGAP 12 11/17/2020   EGFR 64 10/07/2021   GFR 89.52 02/25/2018   Lab Results  Component Value Date   CHOL 187 03/21/2021   CHOL 245 (H)  09/19/2020   CHOL 163 11/25/2017   Lab Results  Component Value Date   HDL 56 03/21/2021   HDL 47 09/19/2020   HDL 54.00 11/25/2017   Lab Results  Component Value Date   LDLCALC 113 (H) 03/21/2021   LDLCALC 179 (H) 09/19/2020   LDLCALC 97 11/25/2017   Lab Results  Component Value Date   TRIG 98 03/21/2021   TRIG 97 09/19/2020   TRIG 116 09/17/2020   Lab Results  Component Value Date   CHOLHDL 3.3 03/21/2021   CHOLHDL 5.2 09/19/2020   CHOLHDL 3 11/25/2017   Lab Results  Component Value Date   HGBA1C 9.7 (A) 09/05/2021   HGBA1C 9.7 09/05/2021   HGBA1C 9.7 (A) 09/05/2021   HGBA1C 9.7 (A) 09/05/2021       Assessment & Plan:   Problem List Items Addressed This Visit       Cardiovascular and Mediastinum   Stroke (cerebrum) ([place]) - Primary   Relevant Medications   enalapril (VASOTEC) 10 MG tablet, refilled without change  Encouraged continued diet and exercise efforts  Encouraged continued compliance with medication       Nervous and Auditory   Status epilepticus ([place])   Other Visit Diagnoses     Fatigue due to excessive exertion, initial encounter       Relevant Orders   CBC with Differential/Platelet (Completed)   Comprehensive metabolic panel (Completed) Discussed possible causes  Discussed non pharmacological methods for management of symptoms Informed to take OTC medications as needed    Follow  up in 3 mths for reevaluation of symptoms, sooner as needed     I am having Falicia R. Robinson maintain her Cholecalciferol (VITAMIN D PO), fluticasone, atorvastatin, metFORMIN, Accu-Chek Guide, Vitamin E, levETIRAcetam, PEN NEEDLES 31GX5/16", lisinopril-hydrochlorothiazide, amLODipine, glipiZIDE, aspirin EC, Basaglar KwikPen, hydrALAZINE, and enalapril. We will continue to administer cloNIDine.  No orders of the defined types were placed in this encounter.    Teena Dunk, NP

## 2021-10-08 LAB — CBC WITH DIFFERENTIAL/PLATELET
Basophils Absolute: 0.1 10*3/uL (ref 0.0–0.2)
Basos: 1 %
EOS (ABSOLUTE): 0.2 10*3/uL (ref 0.0–0.4)
Eos: 2 %
Hematocrit: 39.2 % (ref 34.0–46.6)
Hemoglobin: 13.7 g/dL (ref 11.1–15.9)
Immature Grans (Abs): 0 10*3/uL (ref 0.0–0.1)
Immature Granulocytes: 0 %
Lymphocytes Absolute: 1.9 10*3/uL (ref 0.7–3.1)
Lymphs: 23 %
MCH: 29.5 pg (ref 26.6–33.0)
MCHC: 34.9 g/dL (ref 31.5–35.7)
MCV: 85 fL (ref 79–97)
Monocytes Absolute: 0.6 10*3/uL (ref 0.1–0.9)
Monocytes: 7 %
Neutrophils Absolute: 5.5 10*3/uL (ref 1.4–7.0)
Neutrophils: 67 %
Platelets: 245 10*3/uL (ref 150–450)
RBC: 4.64 x10E6/uL (ref 3.77–5.28)
RDW: 12.9 % (ref 11.7–15.4)
WBC: 8.3 10*3/uL (ref 3.4–10.8)

## 2021-10-08 LAB — COMPREHENSIVE METABOLIC PANEL
ALT: 34 IU/L — ABNORMAL HIGH (ref 0–32)
AST: 23 IU/L (ref 0–40)
Albumin/Globulin Ratio: 1.6 (ref 1.2–2.2)
Albumin: 4.3 g/dL (ref 3.8–4.9)
Alkaline Phosphatase: 101 IU/L (ref 44–121)
BUN/Creatinine Ratio: 13 (ref 9–23)
BUN: 13 mg/dL (ref 6–24)
Bilirubin Total: 0.3 mg/dL (ref 0.0–1.2)
CO2: 24 mmol/L (ref 20–29)
Calcium: 9.7 mg/dL (ref 8.7–10.2)
Chloride: 101 mmol/L (ref 96–106)
Creatinine, Ser: 1.02 mg/dL — ABNORMAL HIGH (ref 0.57–1.00)
Globulin, Total: 2.7 g/dL (ref 1.5–4.5)
Glucose: 151 mg/dL — ABNORMAL HIGH (ref 70–99)
Potassium: 3.6 mmol/L (ref 3.5–5.2)
Sodium: 143 mmol/L (ref 134–144)
Total Protein: 7 g/dL (ref 6.0–8.5)
eGFR: 64 mL/min/{1.73_m2} (ref >59)

## 2021-12-06 ENCOUNTER — Other Ambulatory Visit: Payer: Self-pay | Admitting: Nurse Practitioner

## 2021-12-06 DIAGNOSIS — E782 Mixed hyperlipidemia: Secondary | ICD-10-CM

## 2021-12-29 ENCOUNTER — Telehealth: Payer: Self-pay

## 2021-12-30 ENCOUNTER — Telehealth (INDEPENDENT_AMBULATORY_CARE_PROVIDER_SITE_OTHER): Payer: Self-pay | Admitting: Nurse Practitioner

## 2021-12-30 ENCOUNTER — Encounter: Payer: Self-pay | Admitting: Nurse Practitioner

## 2021-12-30 DIAGNOSIS — E782 Mixed hyperlipidemia: Secondary | ICD-10-CM

## 2021-12-30 DIAGNOSIS — I10 Essential (primary) hypertension: Secondary | ICD-10-CM

## 2021-12-30 DIAGNOSIS — Z794 Long term (current) use of insulin: Secondary | ICD-10-CM

## 2021-12-30 DIAGNOSIS — E1165 Type 2 diabetes mellitus with hyperglycemia: Secondary | ICD-10-CM

## 2021-12-30 MED ORDER — AMLODIPINE BESYLATE 10 MG PO TABS: 10.0000 mg | ORAL_TABLET | Freq: Every day | ORAL | 3 refills | Status: DC

## 2021-12-30 MED ORDER — BASAGLAR KWIKPEN 100 UNIT/ML ~~LOC~~ SOPN: 30.0000 [IU] | PEN_INJECTOR | Freq: Two times a day (BID) | SUBCUTANEOUS | 0 refills | Status: DC

## 2021-12-30 MED ORDER — ATORVASTATIN CALCIUM 40 MG PO TABS: 40.0000 mg | ORAL_TABLET | Freq: Every day | ORAL | 2 refills | Status: DC

## 2021-12-30 MED ORDER — ENALAPRIL MALEATE 10 MG PO TABS: 10.0000 mg | ORAL_TABLET | Freq: Every day | ORAL | 2 refills | Status: DC

## 2021-12-30 MED ORDER — LISINOPRIL-HYDROCHLOROTHIAZIDE 20-25 MG PO TABS: 1.0000 | ORAL_TABLET | Freq: Every day | ORAL | 3 refills | Status: DC

## 2021-12-30 NOTE — Patient Instructions (Signed)
1. Mixed hyperlipidemia  - atorvastatin (LIPITOR) 40 MG tablet; Take 1 tablet (40 mg total) by mouth daily.  Dispense: 30 tablet; Refill: 2  2. Essential hypertension  - enalapril (VASOTEC) 10 MG tablet; Take 1 tablet (10 mg total) by mouth daily.  Dispense: 30 tablet; Refill: 2 - amLODipine (NORVASC) 10 MG tablet; Take 1 tablet (10 mg total) by mouth daily.  Dispense: 90 tablet; Refill: 3 - lisinopril-hydrochlorothiazide (ZESTORETIC) 20-25 MG tablet; Take 1 tablet by mouth daily.  Dispense: 90 tablet; Refill: 3  3. Type 2 diabetes mellitus with hyperglycemia, without long-term current use of insulin (HCC)  - Insulin Glargine (BASAGLAR KWIKPEN) 100 UNIT/ML; Inject 30 Units into the skin 2 (two) times daily.  Dispense: 15 mL; Refill: 0  Follow up:  Follow up as scheduled

## 2021-12-30 NOTE — Telephone Encounter (Signed)
Error

## 2021-12-30 NOTE — Progress Notes (Signed)
Virtual Visit via Video Note  I connected with BLAYKLEE MABLE on 12/30/21 at  9:00 AM EDT by a video enabled telemedicine application and verified that I am speaking with the correct person using two identifiers.  Location: Patient: home Provider: office   I discussed the limitations of evaluation and management by telemedicine and the availability of in person appointments. The patient expressed understanding and agreed to proceed.  History of Present Illness:  Patient presents today for video visit for medication refill.  Patient states that she has to change pharmacies due to her insurance changing.  She [facility] [address].  Patient will be coming into the office for a routine diabetic and hypertension follow-up with labs at the end of the month.  We will refill medications today.  Patient has no other issues or concerns today. Denies f/c/s, n/v/d, hemoptysis, PND, leg swelling Denies chest pain or edema     Observations/Objective:     10/07/2021   10:12 AM 09/05/2021   10:00 AM 08/05/2021   10:08 AM  Vitals with BMI  Height 5\' 4"  5\' 4"    Weight 158 lbs 13 oz 158 lbs   BMI 42.35 36.14   Systolic [phone]  Diastolic 87 92 95  Pulse 84 92 70      Assessment and Plan:  1. Mixed hyperlipidemia  - atorvastatin (LIPITOR) 40 MG tablet; Take 1 tablet (40 mg total) by mouth daily.  Dispense: 30 tablet; Refill: 2  2. Essential hypertension  - enalapril (VASOTEC) 10 MG tablet; Take 1 tablet (10 mg total) by mouth daily.  Dispense: 30 tablet; Refill: 2 - amLODipine (NORVASC) 10 MG tablet; Take 1 tablet (10 mg total) by mouth daily.  Dispense: 90 tablet; Refill: 3 - lisinopril-hydrochlorothiazide (ZESTORETIC) 20-25 MG tablet; Take 1 tablet by mouth daily.  Dispense: 90 tablet; Refill: 3  3. Type 2 diabetes mellitus with hyperglycemia, without long-term current use of insulin (HCC)  - Insulin Glargine (BASAGLAR KWIKPEN)  100 UNIT/ML; Inject 30 Units into the skin 2 (two) times daily.  Dispense: 15 mL; Refill: 0  Follow up:  Follow up as scheduled    I discussed the assessment and treatment plan with the patient. The patient was provided an opportunity to ask questions and all were answered. The patient agreed with the plan and demonstrated an understanding of the instructions.   The patient was advised to call back or seek an in-person evaluation if the symptoms worsen or if the condition fails to improve as anticipated.  I provided 22 minutes of non-face-to-face time during this encounter.   Fenton Foy, NP

## 2022-01-07 ENCOUNTER — Ambulatory Visit: Payer: Commercial Managed Care - HMO | Admitting: Nurse Practitioner

## 2022-01-20 ENCOUNTER — Other Ambulatory Visit: Payer: Self-pay | Admitting: Nurse Practitioner

## 2022-01-20 DIAGNOSIS — E1165 Type 2 diabetes mellitus with hyperglycemia: Secondary | ICD-10-CM

## 2022-01-27 ENCOUNTER — Other Ambulatory Visit: Payer: Self-pay | Admitting: Nurse Practitioner

## 2022-01-27 DIAGNOSIS — Z1231 Encounter for screening mammogram for malignant neoplasm of breast: Secondary | ICD-10-CM

## 2022-01-28 ENCOUNTER — Other Ambulatory Visit: Payer: Self-pay | Admitting: Nurse Practitioner

## 2022-01-28 ENCOUNTER — Telehealth: Payer: 59 | Admitting: Family

## 2022-01-28 DIAGNOSIS — I10 Essential (primary) hypertension: Secondary | ICD-10-CM | POA: Diagnosis not present

## 2022-01-28 DIAGNOSIS — E1165 Type 2 diabetes mellitus with hyperglycemia: Secondary | ICD-10-CM

## 2022-01-28 DIAGNOSIS — Z794 Long term (current) use of insulin: Secondary | ICD-10-CM

## 2022-01-28 DIAGNOSIS — E119 Type 2 diabetes mellitus without complications: Secondary | ICD-10-CM | POA: Diagnosis not present

## 2022-01-28 MED ORDER — "PEN NEEDLES 5/16"" 31G X 8 MM MISC": 1.0000 [IU] | Freq: Every day | 2 refills | Status: AC

## 2022-01-28 MED ORDER — BASAGLAR KWIKPEN 100 UNIT/ML ~~LOC~~ SOPN: 30.0000 [IU] | PEN_INJECTOR | Freq: Two times a day (BID) | SUBCUTANEOUS | 0 refills | Status: DC

## 2022-01-28 MED ORDER — FLUTICASONE PROPIONATE 50 MCG/ACT NA SUSP: 2.0000 | Freq: Every day | NASAL | 6 refills | Status: DC

## 2022-01-28 NOTE — Patient Instructions (Signed)
   If you do not have a PCP, [facility] offers a free physician referral service available at [phone]. Our trained staff has the experience, knowledge and resources to put you in touch with a physician who is right for you.    If you are having a true medical emergency please call 911.   Your e-visit answers were reviewed by a board certified advanced clinical practitioner to complete your personal care plan.  Thank you for using e-Visits.

## 2022-01-28 NOTE — Progress Notes (Signed)
Virtual Visit Consent   Tricia Bridges, you are scheduled for a virtual visit with a [facility] provider today. Just as with appointments in the office, your consent must be obtained to participate. Your consent will be active for this visit and any virtual visit you may have with one of our providers in the next 365 days. If you have a MyChart account, a copy of this consent can be sent to you electronically.  As this is a virtual visit, video technology does not allow for your provider to perform a traditional examination. This may limit your provider's ability to fully assess your condition. If your provider identifies any concerns that need to be evaluated in person or the need to arrange testing (such as labs, EKG, etc.), we will make arrangements to do so. Although advances in technology are sophisticated, we cannot ensure that it will always work on either your end or our end. If the connection with a video visit is poor, the visit may have to be switched to a telephone visit. With either a video or telephone visit, we are not always able to ensure that we have a secure connection.  By engaging in this virtual visit, you consent to the provision of healthcare and authorize for your insurance to be billed (if applicable) for the services provided during this visit. Depending on your insurance coverage, you may receive a charge related to this service.  I need to obtain your verbal consent now. Are you willing to proceed with your visit today? Tricia Bridges has provided verbal consent on 01/28/2022 for a virtual visit (video or telephone). Jannifer Rodney, FNP  Date: 01/28/2022 4:14 PM  Virtual Visit via Video Note   I, Jannifer Rodney, connected with  Tricia Bridges  ([id number], 1965-07-14) on 01/28/22 at  4:15 PM EDT by a video-enabled telemedicine application and verified that I am speaking with the correct person using two identifiers.  Location: Patient: Virtual Visit Location Patient:  Home Provider: Virtual Visit Location Provider: Home Office   I discussed the limitations of evaluation and management by telemedicine and the availability of in person appointments. The patient expressed understanding and agreed to proceed.    History of Present Illness: Tricia Bridges is a 56 y.o. who identifies as a female who was assigned female at birth, and is being seen today for insulin refill and needle refill. She had a video visit on 12/30/21, but states she is unsure who her PCP because her PCP left.     HPI: Diabetes She presents for her follow-up diabetic visit. She has type 2 diabetes mellitus. Pertinent negatives for diabetes include no blurred vision and no foot paresthesias. Symptoms are stable. Risk factors for coronary artery disease include dyslipidemia, diabetes mellitus, hypertension and sedentary lifestyle. She is following a generally unhealthy diet. Her overall blood glucose range is 140-180 mg/dl.  Hypertension This is a chronic problem. The current episode started more than 1 year ago. The problem has been waxing and waning since onset. The problem is uncontrolled. Associated symptoms include malaise/fatigue. Pertinent negatives include no blurred vision, peripheral edema or shortness of breath. Risk factors for coronary artery disease include dyslipidemia, diabetes mellitus and sedentary lifestyle. Past treatments include ACE inhibitors. The current treatment provides mild improvement.    Problems:  Patient Active Problem List   Diagnosis Date Noted   Stroke (cerebrum) (HCC) 09/30/2020   Non-traumatic rhabdomyolysis    Status epilepticus (HCC) 09/16/2020   HHNC (hyperglycemic hyperosmolar nonketotic coma) (  HCC)    Acute metabolic encephalopathy    Mixed hyperlipidemia 11/25/2017   Essential hypertension 08/13/2017   Breast cancer screening 08/13/2017   Type 2 diabetes mellitus with hyperglycemia, without long-term current use of insulin (HCC)    Bilateral  carpal tunnel syndrome 07/29/2017    Allergies:  Allergies  Allergen Reactions   Penicillins Itching    Has patient had a PCN reaction causing immediate rash, facial/tongue/throat swelling, SOB or lightheadedness with hypotension: no Has patient had a PCN reaction causing severe rash involving mucus membranes or skin necrosis: no Has patient had a PCN reaction that required hospitalization: no Has patient had a PCN reaction occurring within the last 10 years: yes If all of the above answers are "NO", then may proceed with Cephalosporin use.    Medications:  Current Outpatient Medications:    amLODipine (NORVASC) 10 MG tablet, Take 1 tablet (10 mg total) by mouth daily., Disp: 90 tablet, Rfl: 3   aspirin EC 81 MG tablet, Take 1 tablet (81 mg total) by mouth daily. Swallow whole., Disp: 30 tablet, Rfl: 12   atorvastatin (LIPITOR) 40 MG tablet, Take 1 tablet (40 mg total) by mouth daily., Disp: 30 tablet, Rfl: 2   Cholecalciferol (VITAMIN D PO), Take 1 tablet by mouth daily., Disp: , Rfl:    fluticasone (FLONASE) 50 MCG/ACT nasal spray, Place 2 sprays into both nostrils daily., Disp: 16 g, Rfl: 6   glipiZIDE (GLUCOTROL) 5 MG tablet, Take 1 tablet (5 mg total) by mouth 2 (two) times daily before a meal., Disp: 60 tablet, Rfl: 3   glucose blood (ACCU-CHEK GUIDE) test strip, Use as directed two time daily., Disp: 100 each, Rfl: 12   hydrALAZINE (APRESOLINE) 10 MG tablet, Take 1 tablet (10 mg total) by mouth daily for 7 days, THEN 1 tablet (10 mg total) 2 (two) times daily for 7 days, THEN 1 tablet (10 mg total) 3 (three) times daily., Disp: 201 tablet, Rfl: 0   Insulin Glargine (BASAGLAR KWIKPEN) 100 UNIT/ML, Inject 30 Units into the skin 2 (two) times daily., Disp: 15 mL, Rfl: 0   Insulin Pen Needle (PEN NEEDLES 31GX5/16") 31G X 8 MM MISC, Use as directed, Disp: 100 each, Rfl: 2   levETIRAcetam (KEPPRA) 500 MG tablet, Take 1 tablet (500 mg total) by mouth 2 (two) times daily., Disp: 180 tablet,  Rfl: 3   lisinopril-hydrochlorothiazide (ZESTORETIC) 20-25 MG tablet, Take 1 tablet by mouth daily., Disp: 90 tablet, Rfl: 3   metFORMIN (GLUCOPHAGE-XR) 750 MG 24 hr tablet, Take 1 tablet (750 mg total) by mouth daily with breakfast., Disp: 90 tablet, Rfl: 3   Vitamin E 400 units TABS, Take 1 tablet (400 Units total) by mouth daily., Disp: 90 tablet, Rfl: 3  Observations/Objective: Patient is well-developed, well-nourished in no acute distress.  Resting comfortably  at home.  Head is normocephalic, atraumatic.  No labored breathing.  Speech is clear and coherent with logical content.  Patient is alert and oriented at baseline.    Assessment and Plan: 1. Type 2 diabetes mellitus with hyperglycemia, without long-term current use of insulin (HCC) - Insulin Glargine (BASAGLAR KWIKPEN) 100 UNIT/ML; Inject 30 Units into the skin 2 (two) times daily.  Dispense: 15 mL; Refill: 0 - Insulin Pen Needle (PEN NEEDLES 31GX5/16") 31G X 8 MM MISC; Use as directed  Dispense: 100 each; Refill: 2  2. Type 2 diabetes mellitus without complication, with long-term current use of insulin (HCC)  3. Essential hypertension  Long discussion discussed that  we can only prescribe medications for one month. She needs to establish care with PCP.  Number given for her to call and establish with [facility] PCP.  Low carb diet Take medications   Follow Up Instructions: I discussed the assessment and treatment plan with the patient. The patient was provided an opportunity to ask questions and all were answered. The patient agreed with the plan and demonstrated an understanding of the instructions.  A copy of instructions were sent to the patient via MyChart unless otherwise noted below.     The patient was advised to call back or seek an in-person evaluation if the symptoms worsen or if the condition fails to improve as anticipated.  Time:  I spent 12 minutes with the patient via telehealth technology discussing the  above problems/concerns.    Evelina Dun, FNP

## 2022-02-04 ENCOUNTER — Ambulatory Visit: Payer: Self-pay | Admitting: Neurology

## 2022-02-22 ENCOUNTER — Other Ambulatory Visit: Payer: Self-pay | Admitting: Nurse Practitioner

## 2022-02-22 DIAGNOSIS — I10 Essential (primary) hypertension: Secondary | ICD-10-CM

## 2022-02-22 DIAGNOSIS — E782 Mixed hyperlipidemia: Secondary | ICD-10-CM

## 2022-02-23 ENCOUNTER — Telehealth: Payer: Self-pay | Admitting: Pharmacist

## 2022-02-23 NOTE — Progress Notes (Signed)
Patient appearing as having an elevated blood pressure and A1c on last readings. Attempted to contact patient to discuss. Left voicemail for her to return my call her convenience.  Catie Eppie Gibson, PharmD, [facility] [phone]

## 2022-03-10 NOTE — Telephone Encounter (Addendum)
Caller & Relationship to patient:  MRN #  [id number]   Call Back Number:   Date of Last Office Visit: 01/28/2022     Date of Next Office Visit: 04/03/2022    Medication(s) to be Refilled: Metformin Lisinopril Levetiracetam Atorvastatin Amlodipine Enalapril 10 & 20 mg Insulin   [facility] [address]  ** Please notify patient to allow 48-72 hours to process** **Let patient know to contact pharmacy at the end of the day to make sure medication is ready. ** **If patient has not been seen in a year or longer, book an appointment **Advise to use MyChart for refill requests OR to contact their pharmacy

## 2022-03-11 ENCOUNTER — Other Ambulatory Visit: Payer: 59 | Admitting: Pharmacist

## 2022-03-11 DIAGNOSIS — E119 Type 2 diabetes mellitus without complications: Secondary | ICD-10-CM

## 2022-03-11 MED ORDER — VALSARTAN-HYDROCHLOROTHIAZIDE 320-25 MG PO TABS: 1.0000 | ORAL_TABLET | Freq: Every day | ORAL | 3 refills | Status: DC

## 2022-03-11 MED ORDER — ACCU-CHEK GUIDE VI STRP: ORAL_STRIP | 12 refills | Status: AC

## 2022-03-11 NOTE — Patient Instructions (Signed)
Hi Avneet,   Stop enalapril and lisinopril/HCTZ. We're going to use a different medication that could be a bit safer. Start valsartan/HCTZ 320/25 mg once daily. Continue amlodipine 10 mg once daily.   Check your blood pressure once daily, and any time you have concerning symptoms like headache, chest pain, dizziness, shortness of breath, or vision changes.   Our goal is less than 130/80.  To appropriately check your blood pressure, make sure you do the following:  1) Avoid caffeine, exercise, or tobacco products for 30 minutes before checking. Empty your bladder. 2) Sit with your back supported in a flat-backed chair. Rest your arm on something flat (arm of the chair, table, etc). 3) Sit still with your feet flat on the floor, resting, for at least 5 minutes.  4) Check your blood pressure. Take 1-2 readings.  5) Write down these readings and bring with you to any provider appointments.  Bring your home blood pressure machine with you to a provider's office for accuracy comparison at least once a year.   Make sure you take your blood pressure medications before you come to any office visit, even if you were asked to fast for labs.   Check your blood sugars twice daily:  1) Fasting, first thing in the morning before breakfast and  2) 2 hours after your largest meal.   For a goal A1c of less than 7%, goal fasting readings are less than 130 and goal 2 hour after meal readings are less than 180.   We'll talk about adding a medication that can help lower your blood sugars AND reduce your risk of stroke when we talk in January.   Thanks!  Catie Eppie Gibson, PharmD, BCACP, CPP [facility] [phone]

## 2022-03-11 NOTE — Addendum Note (Signed)
Addended by: Nilda Simmer T on: 03/11/2022 02:21 PM   Modules accepted: Orders

## 2022-03-11 NOTE — Progress Notes (Signed)
03/11/2022 Name: Tricia Bridges MRN: [id number] DOB: July 01, 1965  Chief Complaint  Patient presents with   Medication Management   Diabetes   Hypertension   Hyperlipidemia    Tricia Bridges is a 56 y.o. year old female who presented for a telephone visit.   They were referred to the pharmacist by a quality report for assistance in managing diabetes and hypertension.   Subjective:  Care Team: Primary Care Provider: Ivonne Andrew, NP ; Next Scheduled Visit: 04/03/22  Medication Access/Adherence  Current Pharmacy:  [id number] [place], [place] - [address] AT [facility] [address] [place] [address] Phone: [phone] Fax: [phone]  [facility] - [facility] [address], [address] [place] [address] Phone: [phone] Fax: [phone]  [id number] - [place], [place] - [address] [place] [address] Phone: [phone] Fax: [phone]   Patient reports affordability concerns with their medications: No  Patient reports access/transportation concerns to their pharmacy: No  Patient reports adherence concerns with their medications:  No     Diabetes:  Current medications: metformin XR 750 mg daily, Basaglar 30 units twice daily  Current glucose readings: needs refill on strips, but does report that when she was last checking, fastings between 105-120  Patient denies hypoglycemic s/sx including dizziness, shakiness, sweating. Patient denies hyperglycemic symptoms including polyuria, polydipsia, polyphagia, nocturia, neuropathy, blurred vision.  Denies medication cost concerns  Hypertension:  Current medications: lisinopril/HCTZ 20/25 mg daily, enalapril 20 mg QAM and 10 mg QPM, amlodipine 10 mg daily  Patient has an automated, upper arm home BP cuff Current blood pressure readings readings: reports last readings  ~130-140s/90s  Patient denies hypotensive s/sx including dizziness, lightheadedness.  Patient denies hypertensive symptoms including headache, chest pain, shortness of breath  No history of renal artery ultrasound; no history of sleep study. Does report that she snores when she sleeps.   Hyperlipidemia/ASCVD Risk Reduction  Current lipid lowering medications: atorvastatin 40 mg daily  Antiplatelet regimen: aspirin 81 mg daily  Health Maintenance  Health Maintenance Due  Topic Date Due   COVID-19 Vaccine (1) Never done   Diabetic kidney evaluation - Urine ACR  Never done   Hepatitis C Screening  Never done   Zoster Vaccines- Shingrix (1 of 2) Never done   INFLUENZA VACCINE  11/11/2021   FOOT EXAM  12/05/2021   OPHTHALMOLOGY EXAM  01/29/2022   HEMOGLOBIN A1C  03/08/2022     Objective: Lab Results  Component Value Date   HGBA1C 9.7 (A) 09/05/2021   HGBA1C 9.7 09/05/2021   HGBA1C 9.7 (A) 09/05/2021   HGBA1C 9.7 (A) 09/05/2021    Lab Results  Component Value Date   CREATININE 1.02 (H) 10/07/2021   BUN 13 10/07/2021   NA 143 10/07/2021   K 3.6 10/07/2021   CL 101 10/07/2021   CO2 24 10/07/2021    Lab Results  Component Value Date   CHOL 187 03/21/2021   HDL 56 03/21/2021   LDLCALC 113 (H) 03/21/2021   TRIG 98 03/21/2021   CHOLHDL 3.3 03/21/2021    Medications Reviewed Today     Reviewed by Alden Hipp, RPH-CPP (Pharmacist) on 03/11/22 at [phone]  Med List Status: <None>   Medication Order Taking? Sig Documenting Provider Last Dose Status Informant  amLODipine (NORVASC) 10 MG tablet [id number] Yes Take 1 tablet (10 mg total) by mouth daily. Ivonne Andrew,  NP Taking Active   aspirin EC 81 MG tablet [id number] Yes Take 1 tablet (81 mg total) by mouth daily. Swallow whole. Orion Crook I, NP Taking Active   atorvastatin (LIPITOR) 40 MG tablet [id number] Yes TAKE 1 TABLET BY MOUTH EVERY DAY Ivonne Andrew, NP Taking Active   Cholecalciferol (VITAMIN D  PO) [id number] Yes Take 1 tablet by mouth daily. [provider] Taking Active Multiple Informants  enalapril (VASOTEC) 10 MG tablet [id number] Yes TAKE 1 TABLET BY MOUTH EVERY DAY Ivonne Andrew, NP Taking Active            Med Note Clearance Coots, Janace Litten   Wed Mar 11, 2022  9:33 AM) Taking 20 mg QAM and 10 mg QPM  fluticasone (FLONASE) 50 MCG/ACT nasal spray [id number] Yes Place 2 sprays into both nostrils daily. Junie Spencer, FNP Taking Active   glucose blood (ACCU-CHEK GUIDE) test strip [id number] Yes Use as directed two time daily. Barbette Merino, NP Taking Active            Med Note (LONG, ASHLEY L   Fri Sep 05, 2021  9:57 AM) As needed  Insulin Glargine [facility] KWIKPEN) 100 UNIT/ML [id number] Yes Inject 30 Units into the skin 2 (two) times daily. Junie Spencer, FNP Taking Active   Insulin Pen Needle (PEN NEEDLES 31GX5/16") 31G X 8 MM MISC [id number] Yes Use as directed Junie Spencer, FNP Taking Active   levETIRAcetam (KEPPRA) 500 MG tablet [id number] Yes Take 1 tablet (500 mg total) by mouth 2 (two) times daily. Glean Salvo, NP Taking Active   lisinopril-hydrochlorothiazide (ZESTORETIC) 20-25 MG tablet [id number] Yes Take 1 tablet by mouth daily. Ivonne Andrew, NP Taking Active   metFORMIN (GLUCOPHAGE-XR) 750 MG 24 hr tablet [id number] Yes Take 1 tablet (750 mg total) by mouth daily with breakfast. Barbette Merino, NP Taking Active               Assessment/Plan:   Diabetes: - Currently uncontrolled - Reviewed long term cardiovascular and renal outcomes of uncontrolled blood sugar - Reviewed goal A1c, goal fasting, and goal 2 hour post prandial glucose - Recommend to continue current regimen until PCP follow up in Dec. Recommend addition of GLP1 and reduction/elimination of insulin to maintain blood sugar control given history of CVA. Will continue to work together on that. - Recommend to check glucose twice daily, fasting and 2 hour post prandial. Patient  requests refill for test strips. Refill sent under Primary Care Standing order  Hypertension: - Currently uncontrolled and on duplicative therapy.  - Reviewed long term cardiovascular and renal outcomes of uncontrolled blood pressure - Reviewed appropriate blood pressure monitoring technique and reviewed goal blood pressure. Recommended to check home blood pressure and heart rate daily - Recommend to discontinue all ACEI therapy due to higher risk of angioedema in the black/african american population. Start valsartan 320 mg/HCTZ 25 mg, continue amlodipine 10 mg daily. Resistant HTN work up per PCP at next appointment, consider referral to [facility].   Hyperlipidemia/ASCVD Risk Reduction: - Currently uncontrolled but due for updated labs - Recommend to continue current regimen at this time  Follow Up Plan: phone call in 6 weeks  Catie Eppie Gibson, PharmD, [facility] [phone]

## 2022-03-12 ENCOUNTER — Ambulatory Visit
Admission: RE | Admit: 2022-03-12 | Discharge: 2022-03-12 | Disposition: A | Discharge status: Home / Self Care | Payer: 59 | Source: Ambulatory Visit

## 2022-03-12 DIAGNOSIS — Z1231 Encounter for screening mammogram for malignant neoplasm of breast: Secondary | ICD-10-CM

## 2022-03-12 MED ORDER — VALSARTAN-HYDROCHLOROTHIAZIDE 320-25 MG PO TABS: 1.0000 | ORAL_TABLET | Freq: Every day | ORAL | 3 refills | Status: DC

## 2022-03-12 NOTE — Addendum Note (Signed)
Addended by: Nilda Simmer T on: 03/12/2022 11:36 AM   Modules accepted: Orders

## 2022-03-16 ENCOUNTER — Other Ambulatory Visit: Payer: Self-pay | Admitting: Family

## 2022-03-16 ENCOUNTER — Telehealth: Payer: Self-pay | Admitting: Pharmacist

## 2022-03-16 DIAGNOSIS — E1165 Type 2 diabetes mellitus with hyperglycemia: Secondary | ICD-10-CM

## 2022-03-16 NOTE — Progress Notes (Signed)
   03/16/2022  Patient ID: Tricia Bridges, female   DOB: 08-Jan-1966, 56 y.o.   MRN: [id number]  Outreach to patient today by telephone, while covering for Catie Clearance Coots, in response to MyChart message from patient. Was unable to reach patient via telephone today and left HIPAA compliant voicemail asking patient to return my call.  Receive call back from patient regarding prescription refills.   Find patient has sufficient supply of her medications to last until upcoming appointment with PCP on 04/03/2022, except for her levetiracetam. Collaborate with [facility] and find patient has a 60 day supply of levetiracetam remaining on her latest Rx. Follow up with patient to let her know. Patient states that she will have the remaining supply of her levetiracetam Rx transferred [facility]  Per patient/review of chart, she is due for follow up appointment with [facility] as missed previous appointments with Neurology. Patient states she will call Neurology office today to reschedule missed appointment.  Estelle Grumbles, PharmD, [facility] [phone]

## 2022-03-25 NOTE — Progress Notes (Signed)
PATIENT: Tricia Bridges DOB: 02/23/1966  REASON FOR VISIT: Follow up HISTORY FROM: Patient PRIMARY NEUROLOGIST: Dr. Teresa Coombsamara now that Dr. Anne HahnWillis is retired  HISTORY OF PRESENT ILLNESS: Today 03/26/22 Remains on Keppra 500 mg twice daily. No seizures. 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 . Her health is doing well. A1C in May was 9.7. remains on insulin but would like to get off at some point. Remains on aspirin 81 mg daily. On Lipitor for cholesterol. Has appointment 12/22 with PCP.  No issues or concerns.  04/02/21 SS: Tricia Bridges is a 56 year old female here today for follow-up with history of seizures when very ill June 2022 with severely elevated blood sugars and acute stroke event, MRI of the brain showed evidence of a left caudate focus consistent with an acute to early subacute ischemic infarct, MRA of the head and neck revealed evidence of severe stenosis of the left A1 and M1 segments.  Remains on single agent aspirin.  Also on Keppra for seizures.  No further seizures. Last A1C 12.19 Mar 2021. Has left shoulder pain since illness, her hands get tight/stiff, occasional twitchy pain to head, once a month, less than 10 seconds. Saw PCP recently BP medication adjusted. No seizures, tolerating Keppra okay. 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 Has FLMA now, has fatigue since the stroke event. Overall doing well.   HISTORY  09/30/2020 Dr. Anne HahnWillis: Tricia Bridges is a 56 year old right handed black female with a history of poorly controlled diabetes and a history of hypertension.  She was admitted to the hospital on 16 September 2020 with onset of seizures, status epilepticus.  Admission blood work revealed a glucose level of 623, and a white blood count of 20.  The patient was febrile.  A urine drug screen was positive only for benzodiazepines.  The lactic acid level was 5.7.  The troponin I level was 49.  The hemoglobin A1c was greater than 15.5.  CK enzyme levels peaked at 15,800.   The patient has been noted to have a seizure at home prior to coming into the hospital and EMS witnessed 2 other seizures.  Propofol was used following intubation for seizure control.  The patient eventually was switched over to Keppra.  The patient was started on insulin infusion.  MRI of the brain showed evidence of a left caudate focus consistent with an acute to early subacute ischemic infarct, MRA of the head and neck revealed evidence of severe stenosis of the left A1 and M1 segments.  The patient was treated with aspirin and Plavix combination.  After hospitalization, the patient has done fairly well, she believes that her mentation has returned to near normal.  She is not having any headaches, dizziness, or any residual numbness or weakness of extremities.  She denies any gait disturbance.  She did have some blurred vision initially, but this has improved.  She has not had any recurrent seizures.  She is on aspirin and Plavix combination, she is to stop the Plavix after 3 weeks.  She is tolerating the Keppra well.  She no longer has any muscle stiffness or any muscle weakness.  She indicates that she does not smoke cigarettes or drink alcohol.  The patient comes in with her daughter today, the daughter witnessed the first seizure at home, the seizure was a generalized event lasting a minute or so associated with urinary incontinence and tongue biting.  The patient denies any prior history of seizures,  she denies any history of head trauma or any family history of seizures.  The patient was not taking care of her diabetes prior to admission, she was drinking regular soft drinks and she was on only 3 units of insulin daily, she was to be taking 20 units a day.  REVIEW OF SYSTEMS: Out of a complete 14 system review of symptoms, the patient complains only of the following symptoms, and all other reviewed systems are negative.   See HPI  ALLERGIES: Allergies  Allergen Reactions   Penicillins Itching     Has patient had a PCN reaction causing immediate rash, facial/tongue/throat swelling, SOB or lightheadedness with hypotension: no Has patient had a PCN reaction causing severe rash involving mucus membranes or skin necrosis: no Has patient had a PCN reaction that required hospitalization: no Has patient had a PCN reaction occurring within the last 10 years: yes If all of the above answers are "NO", then may proceed with Cephalosporin use.     HOME MEDICATIONS: Outpatient Medications Prior to Visit  Medication Sig Dispense Refill   amLODipine (NORVASC) 10 MG tablet Take 1 tablet (10 mg total) by mouth daily. 90 tablet 3   aspirin EC 81 MG tablet Take 1 tablet (81 mg total) by mouth daily. Swallow whole. 30 tablet 12   atorvastatin (LIPITOR) 40 MG tablet TAKE 1 TABLET BY MOUTH EVERY DAY 30 tablet 0   Cholecalciferol (VITAMIN D PO) Take 1 tablet by mouth daily.     fluticasone (FLONASE) 50 MCG/ACT nasal spray Place 2 sprays into both nostrils daily. 16 g 6   glucose blood (ACCU-CHEK GUIDE) test strip Use as directed two time daily. 100 each 12   Insulin Glargine (BASAGLAR KWIKPEN) 100 UNIT/ML Inject 30 Units into the skin 2 (two) times daily. 15 mL 0   Insulin Pen Needle (PEN NEEDLES 31GX5/16") 31G X 8 MM MISC Use as directed 100 each 2   metFORMIN (GLUCOPHAGE-XR) 750 MG 24 hr tablet Take 1 tablet (750 mg total) by mouth daily with breakfast. 90 tablet 3   valsartan-hydrochlorothiazide (DIOVAN-HCT) 320-25 MG tablet Take 1 tablet by mouth daily. 90 tablet 3   levETIRAcetam (KEPPRA) 500 MG tablet Take 1 tablet (500 mg total) by mouth 2 (two) times daily. 180 tablet 3   No facility-administered medications prior to visit.    PAST MEDICAL HISTORY: Past Medical History:  Diagnosis Date   Diabetes mellitus without complication (HCC)    Hypertension    Pre-diabetes    Seizures (HCC)    Stroke (cerebrum) (HCC) 09/30/2020    PAST SURGICAL HISTORY: Past Surgical History:  Procedure  Laterality Date   CESAREAN SECTION     EYE SURGERY      FAMILY HISTORY: Family History  Problem Relation Age of Onset   Hypertension Mother    COPD Mother        Smoker   Alcohol abuse Father    Hypertension Sister     SOCIAL HISTORY: Social History   Socioeconomic History   Marital status: Single    Spouse name: Not on file   Number of children: Not on file   Years of education: Not on file   Highest education level: Not on file  Occupational History   Not on file  Tobacco Use   Smoking status: Never   Smokeless tobacco: Never  Vaping Use   Vaping Use: Never used  Substance and Sexual Activity   Alcohol use: No   Drug use: No   Sexual  activity: Not Currently  Other Topics Concern   Not on file  Social History Narrative   Lives with daughter   Right Handed   Drinks caffeine occassionally   Social Determinants of Health   Financial Resource Strain: Low Risk  (03/11/2022)   Overall Financial Resource Strain (CARDIA)    Difficulty of Paying Living Expenses: Not hard at all  Food Insecurity: Not on file  Transportation Needs: Not on file  Physical Activity: Not on file  Stress: Not on file  Social Connections: Not on file  Intimate Partner Violence: Not on file   PHYSICAL EXAM  Vitals:   03/26/22 1055  BP: 133/82  Pulse: 91  Weight: 160 lb (72.6 kg)  Height: 5\' 4"  (1.626 m)    Body mass index is 27.46 kg/m.  Generalized: Well developed, in no acute distress   Neurological examination  Mentation: Alert oriented to time, place, history taking. Follows all commands speech and language fluent Cranial nerve II-XII: Pupils were equal round reactive to light. Extraocular movements were full, visual field were full on confrontational test. Facial sensation and strength were normal. Head turning and shoulder shrug were normal and symmetric. Motor: The motor testing reveals 5 over 5 strength of all 4 extremities. Good symmetric motor tone is noted throughout.   Sensory: Sensory testing is intact to soft touch on all 4 extremities. No evidence of extinction is noted.  Coordination: Cerebellar testing reveals good finger-nose-finger and heel-to-shin bilaterally.  Gait and station: Gait is normal.  Reflexes: Deep tendon reflexes are symmetric and normal bilaterally.   DIAGNOSTIC DATA (LABS, IMAGING, TESTING) - I reviewed patient records, labs, notes, testing and imaging myself where available.  Lab Results  Component Value Date   WBC 8.3 10/07/2021   HGB 13.7 10/07/2021   HCT 39.2 10/07/2021   MCV 85 10/07/2021   PLT 245 10/07/2021      Component Value Date/Time   NA 143 10/07/2021 1057   K 3.6 10/07/2021 1057   CL 101 10/07/2021 1057   CO2 24 10/07/2021 1057   GLUCOSE 151 (H) 10/07/2021 1057   GLUCOSE 287 (H) 11/17/2020 2220   BUN 13 10/07/2021 1057   CREATININE 1.02 (H) 10/07/2021 1057   CALCIUM 9.7 10/07/2021 1057   PROT 7.0 10/07/2021 1057   ALBUMIN 4.3 10/07/2021 1057   AST 23 10/07/2021 1057   ALT 34 (H) 10/07/2021 1057   ALKPHOS 101 10/07/2021 1057   BILITOT 0.3 10/07/2021 1057   GFRNONAA >60 11/17/2020 2220   GFRAA 49 (L) 06/15/2016 1727   Lab Results  Component Value Date   CHOL 187 03/21/2021   HDL 56 03/21/2021   LDLCALC 113 (H) 03/21/2021   TRIG 98 03/21/2021   CHOLHDL 3.3 03/21/2021   Lab Results  Component Value Date   HGBA1C 9.7 (A) 09/05/2021   HGBA1C 9.7 09/05/2021   HGBA1C 9.7 (A) 09/05/2021   HGBA1C 9.7 (A) 09/05/2021   No results found for: "VITAMINB12" Lab Results  Component Value Date   TSH 0.59 08/13/2017    ASSESSMENT AND PLAN 56 y.o. year old female  has a past medical history of Diabetes mellitus without complication (HCC), Hypertension, Pre-diabetes, Seizures (HCC), and Stroke (cerebrum) (HCC) (09/30/2020). here with:  1.  History of diabetes, poorly controlled   2.  Status epilepticus   3.  Cerebrovascular disease, left caudate infarct  -She prefers to remain on Keppra, continue  Keppra 500 mg twice daily, it was considered to taper off Keppra given her seizure events  occurred during the time of severely elevated blood glucose, acute stroke event -Continue aspirin 81 mg daily for secondary stroke prevention -Keep BP less than 130/90, LDL less than 70, A1c less than 7.0 -Seen PCP for physical next week -Follow-up in 1 year or sooner if needed   Otila Kluver, DNP 03/26/2022, 11:09 AM [facility] [address] [place], [place] [address] [phone]

## 2022-03-26 ENCOUNTER — Encounter: Payer: Self-pay | Admitting: Neurology

## 2022-03-26 ENCOUNTER — Ambulatory Visit: Payer: 59 | Admitting: Neurology

## 2022-03-26 DIAGNOSIS — I63512 Cerebral infarction due to unspecified occlusion or stenosis of left middle cerebral artery: Secondary | ICD-10-CM

## 2022-03-26 DIAGNOSIS — G40901 Epilepsy, unspecified, not intractable, with status epilepticus: Secondary | ICD-10-CM

## 2022-03-26 MED ORDER — LEVETIRACETAM 500 MG PO TABS: 500.0000 mg | ORAL_TABLET | Freq: Two times a day (BID) | ORAL | 3 refills | Status: DC

## 2022-03-26 NOTE — Patient Instructions (Signed)
Continue the Keppra Continue aspirin 81 mg daily Keep BP < 130/90, LDL < 70, A1C < 7.0 See you back in 1 year

## 2022-04-03 ENCOUNTER — Other Ambulatory Visit: Payer: Self-pay

## 2022-04-03 ENCOUNTER — Ambulatory Visit: Payer: 59 | Admitting: Nurse Practitioner

## 2022-04-03 DIAGNOSIS — E1165 Type 2 diabetes mellitus with hyperglycemia: Secondary | ICD-10-CM

## 2022-04-03 MED ORDER — BASAGLAR KWIKPEN 100 UNIT/ML ~~LOC~~ SOPN: 30.0000 [IU] | PEN_INJECTOR | Freq: Two times a day (BID) | SUBCUTANEOUS | 0 refills | Status: DC

## 2022-04-19 ENCOUNTER — Other Ambulatory Visit: Payer: Self-pay | Admitting: Nurse Practitioner

## 2022-04-19 DIAGNOSIS — E782 Mixed hyperlipidemia: Secondary | ICD-10-CM

## 2022-04-19 DIAGNOSIS — I10 Essential (primary) hypertension: Secondary | ICD-10-CM

## 2022-04-20 ENCOUNTER — Ambulatory Visit (INDEPENDENT_AMBULATORY_CARE_PROVIDER_SITE_OTHER): Payer: Self-pay | Admitting: Nurse Practitioner

## 2022-04-20 ENCOUNTER — Other Ambulatory Visit: Payer: Self-pay

## 2022-04-20 ENCOUNTER — Encounter: Payer: Self-pay | Admitting: Nurse Practitioner

## 2022-04-20 VITALS — BP 120/83 | HR 86 | Temp 97.6°F | Ht 64.0 in | Wt 158.0 lb

## 2022-04-20 DIAGNOSIS — E782 Mixed hyperlipidemia: Secondary | ICD-10-CM

## 2022-04-20 DIAGNOSIS — E1165 Type 2 diabetes mellitus with hyperglycemia: Secondary | ICD-10-CM

## 2022-04-20 LAB — POCT GLYCOSYLATED HEMOGLOBIN (HGB A1C): Hemoglobin A1C: 7.7 % — AB (ref 4.0–5.6)

## 2022-04-20 MED ORDER — BASAGLAR KWIKPEN 100 UNIT/ML ~~LOC~~ SOPN: 30.0000 [IU] | PEN_INJECTOR | Freq: Two times a day (BID) | SUBCUTANEOUS | 0 refills | Status: DC

## 2022-04-20 NOTE — Telephone Encounter (Signed)
From: Candise Che To: Office of Fenton Foy, NP Sent: 04/20/2022 2:58 PM EST Subject: Medication Renewal Request  Refills have been requested for the following medications:   Insulin Glargine (BASAGLAR KWIKPEN) 100 UNIT/ML Tricia Bridges]  [id number] [place] [place] [address] Delivery method: [employer]

## 2022-04-20 NOTE — Progress Notes (Signed)
@Patient  ID: , female    DOB: 1965-07-25, 57 y.o.   MRN: [id number]  Chief Complaint  Patient presents with   Follow-up    Referring provider: [id number], NP   HPI  57 year old female with history of stroke, hypertension, diabetes, seizures, carpal tunnel syndrome, mixed hyperlipidemia.  Patient presents today for follow-up visit.  Overall she has been doing well since her last visit here.  A1c is improving.  Blood pressure is within normal range today.  Patient states that she is compliant with medications.  She has no new issues or concerns today. Denies f/c/s, n/v/d, hemoptysis, PND, leg swelling Denies chest pain or edema      Allergies  Allergen Reactions   Penicillins Itching    Has patient had a PCN reaction causing immediate rash, facial/tongue/throat swelling, SOB or lightheadedness with hypotension: no Has patient had a PCN reaction causing severe rash involving mucus membranes or skin necrosis: no Has patient had a PCN reaction that required hospitalization: no Has patient had a PCN reaction occurring within the last 10 years: yes If all of the above answers are "NO", then may proceed with Cephalosporin use.     Immunization History  Administered Date(s) Administered   Influenza,inj,Quad PF,6+ Mos 01/29/2016, 02/25/2018   Pneumococcal Polysaccharide-23 08/13/2017   Tdap 08/13/2017    Past Medical History:  Diagnosis Date   Diabetes mellitus without complication (HCC)    Hypertension    Pre-diabetes    Seizures (HCC)    Stroke (cerebrum) (HCC) 09/30/2020    Tobacco History: Social History   Tobacco Use  Smoking Status Never  Smokeless Tobacco Never   Counseling given: Not Answered   Outpatient Encounter Medications as of 04/20/2022  Medication Sig   amLODipine (NORVASC) 10 MG tablet Take 1 tablet (10 mg total) by mouth daily.   aspirin EC 81 MG tablet Take 1 tablet (81 mg total) by mouth daily. Swallow whole.   atorvastatin  (LIPITOR) 40 MG tablet TAKE 1 TABLET BY MOUTH EVERY DAY   Cholecalciferol (VITAMIN D PO) Take 1 tablet by mouth daily.   fluticasone (FLONASE) 50 MCG/ACT nasal spray Place 2 sprays into both nostrils daily.   glucose blood (ACCU-CHEK GUIDE) test strip Use as directed two time daily.   Insulin Pen Needle (PEN NEEDLES 31GX5/16") 31G X 8 MM MISC Use as directed   levETIRAcetam (KEPPRA) 500 MG tablet Take 1 tablet (500 mg total) by mouth 2 (two) times daily.   metFORMIN (GLUCOPHAGE-XR) 750 MG 24 hr tablet Take 1 tablet (750 mg total) by mouth daily with breakfast.   valsartan-hydrochlorothiazide (DIOVAN-HCT) 320-25 MG tablet Take 1 tablet by mouth daily.   [DISCONTINUED] Insulin Glargine (BASAGLAR KWIKPEN) 100 UNIT/ML Inject 30 Units into the skin 2 (two) times daily.   Insulin Glargine (BASAGLAR KWIKPEN) 100 UNIT/ML Inject 30 Units into the skin 2 (two) times daily.   No facility-administered encounter medications on file as of 04/20/2022.     Review of Systems  Review of Systems  Constitutional: Negative.   HENT: Negative.    Cardiovascular: Negative.   Gastrointestinal: Negative.   Allergic/Immunologic: Negative.   Neurological: Negative.   Psychiatric/Behavioral: Negative.         Physical Exam  BP 120/83   Pulse 86   Temp 97.6 F (36.4 C)   Ht 5\' 4"  (1.626 m)   Wt 158 lb (71.7 kg)   SpO2 98%   BMI 27.12 kg/m   Wt Readings from Last 5 Encounters:  04/20/22 158 lb (71.7 kg)  03/26/22 160 lb (72.6 kg)  10/07/21 158 lb 12.8 oz (72 kg)  09/05/21 158 lb (71.7 kg)  08/05/21 158 lb 6 oz (71.8 kg)     Physical Exam Vitals and nursing note reviewed.  Constitutional:      General: She is not in acute distress.    Appearance: She is well-developed.  Cardiovascular:     Rate and Rhythm: Normal rate and regular rhythm.  Pulmonary:     Effort: Pulmonary effort is normal.     Breath sounds: Normal breath sounds.  Neurological:     Mental Status: She is alert and oriented  to person, place, and time.      Lab Results:  CBC    Component Value Date/Time   WBC 8.3 10/07/2021 1057   WBC 11.4 (H) 11/17/2020 2220   RBC 4.64 10/07/2021 1057   RBC 4.56 11/17/2020 2220   HGB 13.7 10/07/2021 1057   HCT 39.2 10/07/2021 1057   PLT 245 10/07/2021 1057   MCV 85 10/07/2021 1057   MCH 29.5 10/07/2021 1057   MCH 29.6 11/17/2020 2220   MCHC 34.9 10/07/2021 1057   MCHC 33.5 11/17/2020 2220   RDW 12.9 10/07/2021 1057   LYMPHSABS 1.9 10/07/2021 1057   MONOABS 0.8 11/17/2020 2220   EOSABS 0.2 10/07/2021 1057   BASOSABS 0.1 10/07/2021 1057    BMET    Component Value Date/Time   NA 143 10/07/2021 1057   K 3.6 10/07/2021 1057   CL 101 10/07/2021 1057   CO2 24 10/07/2021 1057   GLUCOSE 151 (H) 10/07/2021 1057   GLUCOSE 287 (H) 11/17/2020 2220   BUN 13 10/07/2021 1057   CREATININE 1.02 (H) 10/07/2021 1057   CALCIUM 9.7 10/07/2021 1057   GFRNONAA >60 11/17/2020 2220   GFRAA 49 (L) 06/15/2016 1727     Assessment & Plan:   Type 2 diabetes mellitus with hyperglycemia, without long-term current use of insulin (HCC) - POCT glycosylated hemoglobin (Hb A1C) - Insulin Glargine (BASAGLAR KWIKPEN) 100 UNIT/ML; Inject 30 Units into the skin 2 (two) times daily.  Dispense: 15 mL; Refill: 0 - CBC - Comprehensive metabolic panel - Lipid Panel  2. Mixed hyperlipidemia  - Lipid Panel  Follow up:  Follow up in 3 months     Fenton Foy, NP 04/20/2022

## 2022-04-20 NOTE — Assessment & Plan Note (Signed)
-   POCT glycosylated hemoglobin (Hb A1C) - Insulin Glargine (BASAGLAR KWIKPEN) 100 UNIT/ML; Inject 30 Units into the skin 2 (two) times daily.  Dispense: 15 mL; Refill: 0 - CBC - Comprehensive metabolic panel - Lipid Panel  2. Mixed hyperlipidemia  - Lipid Panel  Follow up:  Follow up in 3 months

## 2022-04-20 NOTE — Patient Instructions (Signed)
1. Type 2 diabetes mellitus with hyperglycemia, without long-term current use of insulin (HCC)  - POCT glycosylated hemoglobin (Hb A1C) - Insulin Glargine (BASAGLAR KWIKPEN) 100 UNIT/ML; Inject 30 Units into the skin 2 (two) times daily.  Dispense: 15 mL; Refill: 0 - CBC - Comprehensive metabolic panel - Lipid Panel  2. Mixed hyperlipidemia  - Lipid Panel  Follow up:  Follow up in 3 months

## 2022-04-21 LAB — COMPREHENSIVE METABOLIC PANEL
ALT: 23 IU/L (ref 0–32)
AST: 15 IU/L (ref 0–40)
Albumin/Globulin Ratio: 1.5 (ref 1.2–2.2)
Albumin: 4.3 g/dL (ref 3.8–4.9)
Alkaline Phosphatase: 94 IU/L (ref 44–121)
BUN/Creatinine Ratio: 18 (ref 9–23)
BUN: 19 mg/dL (ref 6–24)
Bilirubin Total: <0.2 mg/dL (ref 0.0–1.2)
CO2: 28 mmol/L (ref 20–29)
Calcium: 9.9 mg/dL (ref 8.7–10.2)
Chloride: 102 mmol/L (ref 96–106)
Creatinine, Ser: 1.07 mg/dL — ABNORMAL HIGH (ref 0.57–1.00)
Globulin, Total: 2.8 g/dL (ref 1.5–4.5)
Glucose: 165 mg/dL — ABNORMAL HIGH (ref 70–99)
Potassium: 3.5 mmol/L (ref 3.5–5.2)
Sodium: 144 mmol/L (ref 134–144)
Total Protein: 7.1 g/dL (ref 6.0–8.5)
eGFR: 60 mL/min/{1.73_m2} (ref >59)

## 2022-04-21 LAB — CBC
Hematocrit: 36.7 % (ref 34.0–46.6)
Hemoglobin: 12.5 g/dL (ref 11.1–15.9)
MCH: 29.4 pg (ref 26.6–33.0)
MCHC: 34.1 g/dL (ref 31.5–35.7)
MCV: 86 fL (ref 79–97)
Platelets: 252 10*3/uL (ref 150–450)
RBC: 4.25 x10E6/uL (ref 3.77–5.28)
RDW: 12.8 % (ref 11.7–15.4)
WBC: 8.9 10*3/uL (ref 3.4–10.8)

## 2022-04-21 LAB — LIPID PANEL
Chol/HDL Ratio: 3.9 ratio (ref 0.0–4.4)
Cholesterol, Total: 193 mg/dL (ref 100–199)
HDL: 50 mg/dL (ref >39)
LDL Chol Calc (NIH): 124 mg/dL — ABNORMAL HIGH (ref 0–99)
Triglycerides: 103 mg/dL (ref 0–149)
VLDL Cholesterol Cal: 19 mg/dL (ref 5–40)

## 2022-05-15 ENCOUNTER — Other Ambulatory Visit: Payer: Self-pay | Admitting: Nurse Practitioner

## 2022-05-15 DIAGNOSIS — E1165 Type 2 diabetes mellitus with hyperglycemia: Secondary | ICD-10-CM

## 2022-07-20 ENCOUNTER — Other Ambulatory Visit: Payer: Self-pay | Admitting: Nurse Practitioner

## 2022-07-20 ENCOUNTER — Ambulatory Visit (INDEPENDENT_AMBULATORY_CARE_PROVIDER_SITE_OTHER): Payer: Self-pay | Admitting: Nurse Practitioner

## 2022-07-20 ENCOUNTER — Encounter: Payer: Self-pay | Admitting: Nurse Practitioner

## 2022-07-20 DIAGNOSIS — I10 Essential (primary) hypertension: Secondary | ICD-10-CM

## 2022-07-20 DIAGNOSIS — E782 Mixed hyperlipidemia: Secondary | ICD-10-CM

## 2022-07-20 DIAGNOSIS — E1165 Type 2 diabetes mellitus with hyperglycemia: Secondary | ICD-10-CM

## 2022-07-20 LAB — POCT GLYCOSYLATED HEMOGLOBIN (HGB A1C): Hemoglobin A1C: 6.7 % — AB (ref 4.0–5.6)

## 2022-07-20 MED ORDER — ATORVASTATIN CALCIUM 40 MG PO TABS: 40.0000 mg | ORAL_TABLET | Freq: Every day | ORAL | 0 refills | Status: DC

## 2022-07-20 MED ORDER — METFORMIN HCL ER 750 MG PO TB24
750.0000 mg | ORAL_TABLET | Freq: Every day | ORAL | 3 refills | Status: DC
Start: 2022-07-20 — End: 2022-07-20

## 2022-07-20 MED ORDER — METFORMIN HCL ER 500 MG PO TB24: 500.0000 mg | ORAL_TABLET | Freq: Every day | ORAL | 2 refills | Status: DC

## 2022-07-20 MED ORDER — BASAGLAR KWIKPEN 100 UNIT/ML ~~LOC~~ SOPN
25.0000 [IU] | PEN_INJECTOR | Freq: Two times a day (BID) | SUBCUTANEOUS | 3 refills | Status: DC
Start: 2022-07-20 — End: 2022-07-20

## 2022-07-20 MED ORDER — BASAGLAR KWIKPEN 100 UNIT/ML ~~LOC~~ SOPN: 30.0000 [IU] | PEN_INJECTOR | Freq: Two times a day (BID) | SUBCUTANEOUS | 3 refills | Status: DC

## 2022-07-20 MED ORDER — VALSARTAN-HYDROCHLOROTHIAZIDE 320-25 MG PO TABS: 1.0000 | ORAL_TABLET | Freq: Every day | ORAL | 3 refills | Status: DC

## 2022-07-20 MED ORDER — AMLODIPINE BESYLATE 10 MG PO TABS
10.0000 mg | ORAL_TABLET | Freq: Every day | ORAL | 3 refills | Status: DC
Start: 2022-07-20 — End: 2022-11-04

## 2022-07-20 NOTE — Progress Notes (Signed)
@Patient  ID: Tricia Bridges, female    DOB: 27-Nov-1965, 57 y.o.   MRN: [id number]  Chief Complaint  Patient presents with   Follow-up    Referring provider: Ivonne Andrew, NP   HPI  57 year old female with history of stroke, hypertension, diabetes, seizures, carpal tunnel syndrome, mixed hyperlipidemia.   Patient presents today for follow-up visit.  Overall she has been doing well since her last visit here.  A1c is improving. BS ranging 117 - 120's.  Blood pressure is within normal range today.  Patient states that she is compliant with medications.  She has no new issues or concerns today. Denies f/c/s, n/v/d, hemoptysis, PND, leg swelling Denies chest pain or edema        Allergies  Allergen Reactions   Penicillins Itching    Has patient had a PCN reaction causing immediate rash, facial/tongue/throat swelling, SOB or lightheadedness with hypotension: no Has patient had a PCN reaction causing severe rash involving mucus membranes or skin necrosis: no Has patient had a PCN reaction that required hospitalization: no Has patient had a PCN reaction occurring within the last 10 years: yes If all of the above answers are "NO", then may proceed with Cephalosporin use.     Immunization History  Administered Date(s) Administered   Influenza,inj,Quad PF,6+ Mos 01/29/2016, 02/25/2018   Pneumococcal Polysaccharide-23 08/13/2017   Tdap 08/13/2017    Past Medical History:  Diagnosis Date   Diabetes mellitus without complication    Hypertension    Pre-diabetes    Seizures    Stroke (cerebrum) 09/30/2020    Tobacco History: Social History   Tobacco Use  Smoking Status Never  Smokeless Tobacco Never   Counseling given: Not Answered   Outpatient Encounter Medications as of 07/20/2022  Medication Sig   aspirin EC 81 MG tablet Take 1 tablet (81 mg total) by mouth daily. Swallow whole.   Cholecalciferol (VITAMIN D PO) Take 1 tablet by mouth daily.   fluticasone  (FLONASE) 50 MCG/ACT nasal spray Place 2 sprays into both nostrils daily.   glucose blood (ACCU-CHEK GUIDE) test strip Use as directed two time daily.   Insulin Pen Needle (PEN NEEDLES 31GX5/16") 31G X 8 MM MISC Use as directed   levETIRAcetam (KEPPRA) 500 MG tablet Take 1 tablet (500 mg total) by mouth 2 (two) times daily.   metFORMIN (GLUCOPHAGE-XR) 500 MG 24 hr tablet Take 1 tablet (500 mg total) by mouth daily with breakfast.   [DISCONTINUED] amLODipine (NORVASC) 10 MG tablet Take 1 tablet (10 mg total) by mouth daily.   [DISCONTINUED] atorvastatin (LIPITOR) 40 MG tablet TAKE 1 TABLET BY MOUTH EVERY DAY   [DISCONTINUED] Insulin Glargine (BASAGLAR KWIKPEN) 100 UNIT/ML Inject 30 Units into the skin 2 (two) times daily.   [DISCONTINUED] metFORMIN (GLUCOPHAGE-XR) 750 MG 24 hr tablet Take 1 tablet (750 mg total) by mouth daily with breakfast.   [DISCONTINUED] valsartan-hydrochlorothiazide (DIOVAN-HCT) 320-25 MG tablet Take 1 tablet by mouth daily.   amLODipine (NORVASC) 10 MG tablet Take 1 tablet (10 mg total) by mouth daily.   atorvastatin (LIPITOR) 40 MG tablet Take 1 tablet (40 mg total) by mouth daily.   Insulin Glargine (BASAGLAR KWIKPEN) 100 UNIT/ML Inject 25 Units into the skin 2 (two) times daily.   valsartan-hydrochlorothiazide (DIOVAN-HCT) 320-25 MG tablet Take 1 tablet by mouth daily.   [DISCONTINUED] Insulin Glargine (BASAGLAR KWIKPEN) 100 UNIT/ML Inject 30 Units into the skin 2 (two) times daily.   [DISCONTINUED] metFORMIN (GLUCOPHAGE-XR) 750 MG 24 hr tablet Take 1  tablet (750 mg total) by mouth daily with breakfast.   No facility-administered encounter medications on file as of 07/20/2022.     Review of Systems  Review of Systems  Constitutional: Negative.   HENT: Negative.    Cardiovascular: Negative.   Gastrointestinal: Negative.   Allergic/Immunologic: Negative.   Neurological: Negative.   Psychiatric/Behavioral: Negative.         Physical Exam  BP 128/82   Pulse  82   Temp 98 F (36.7 C)   Ht 5\' 4"  (1.626 m)   Wt 153 lb 3.2 oz (69.5 kg)   SpO2 100%   BMI 26.30 kg/m   Wt Readings from Last 5 Encounters:  07/20/22 153 lb 3.2 oz (69.5 kg)  04/20/22 158 lb (71.7 kg)  03/26/22 160 lb (72.6 kg)  10/07/21 158 lb 12.8 oz (72 kg)  09/05/21 158 lb (71.7 kg)     Physical Exam Vitals and nursing note reviewed.  Constitutional:      General: She is not in acute distress.    Appearance: She is well-developed.  Cardiovascular:     Rate and Rhythm: Normal rate and regular rhythm.  Pulmonary:     Effort: Pulmonary effort is normal.     Breath sounds: Normal breath sounds.  Neurological:     Mental Status: She is alert and oriented to person, place, and time.       Assessment & Plan:   Type 2 diabetes mellitus with hyperglycemia, without long-term current use of insulin (HCC) - Ambulatory referral to Ophthalmology - Ambulatory referral to Podiatry - Microalbumin/Creatinine Ratio, Urine - POCT glycosylated hemoglobin (Hb A1C) - Insulin Glargine (BASAGLAR KWIKPEN) 100 UNIT/ML; Inject 25 Units into the skin 2 (two) times daily.  Dispense: 15 mL; Refill: 3 -metformin xr 500mg  once daily  2. Mixed hyperlipidemia  - atorvastatin (LIPITOR) 40 MG tablet; Take 1 tablet (40 mg total) by mouth daily.  Dispense: 30 tablet; Refill: 0  3. Essential hypertension  - amLODipine (NORVASC) 10 MG tablet; Take 1 tablet (10 mg total) by mouth daily.  Dispense: 90 tablet; Refill: 3   Follow up:  Follow up in 3 months     Ivonne Andrew, NP 07/20/2022

## 2022-07-20 NOTE — Telephone Encounter (Signed)
Please advise of other medication because this on e is not covered. KH

## 2022-07-20 NOTE — Patient Instructions (Addendum)
1. Type 2 diabetes mellitus with hyperglycemia, without long-term current use of insulin  - Ambulatory referral to Ophthalmology - Ambulatory referral to Podiatry - Microalbumin/Creatinine Ratio, Urine - POCT glycosylated hemoglobin (Hb A1C) - Insulin Glargine (BASAGLAR KWIKPEN) 100 UNIT/ML; Inject 25 Units into the skin 2 (two) times daily.  Dispense: 15 mL; Refill: 3 -metformin xr 500mg  once daily  2. Mixed hyperlipidemia  - atorvastatin (LIPITOR) 40 MG tablet; Take 1 tablet (40 mg total) by mouth daily.  Dispense: 30 tablet; Refill: 0  3. Essential hypertension  - amLODipine (NORVASC) 10 MG tablet; Take 1 tablet (10 mg total) by mouth daily.  Dispense: 90 tablet; Refill: 3   Follow up:  Follow up in 3 months

## 2022-07-20 NOTE — Assessment & Plan Note (Signed)
-   Ambulatory referral to Ophthalmology - Ambulatory referral to Podiatry - Microalbumin/Creatinine Ratio, Urine - POCT glycosylated hemoglobin (Hb A1C) - Insulin Glargine (BASAGLAR KWIKPEN) 100 UNIT/ML; Inject 25 Units into the skin 2 (two) times daily.  Dispense: 15 mL; Refill: 3 -metformin xr 500mg  once daily  2. Mixed hyperlipidemia  - atorvastatin (LIPITOR) 40 MG tablet; Take 1 tablet (40 mg total) by mouth daily.  Dispense: 30 tablet; Refill: 0  3. Essential hypertension  - amLODipine (NORVASC) 10 MG tablet; Take 1 tablet (10 mg total) by mouth daily.  Dispense: 90 tablet; Refill: 3   Follow up:  Follow up in 3 months

## 2022-07-21 LAB — MICROALBUMIN / CREATININE URINE RATIO
Creatinine, Urine: 37.2 mg/dL
Microalb/Creat Ratio: <8 mg/g creat (ref 0–29)
Microalbumin, Urine: <3 ug/mL

## 2022-07-31 ENCOUNTER — Other Ambulatory Visit: Payer: Self-pay | Admitting: Nurse Practitioner

## 2022-07-31 DIAGNOSIS — E782 Mixed hyperlipidemia: Secondary | ICD-10-CM

## 2022-08-17 ENCOUNTER — Other Ambulatory Visit: Payer: Self-pay | Admitting: Nurse Practitioner

## 2022-08-17 DIAGNOSIS — E782 Mixed hyperlipidemia: Secondary | ICD-10-CM

## 2022-08-25 ENCOUNTER — Telehealth: Payer: Self-pay | Admitting: Pharmacist

## 2022-08-25 ENCOUNTER — Other Ambulatory Visit: Payer: 59 | Admitting: Pharmacist

## 2022-08-25 NOTE — Progress Notes (Unsigned)
Attempted to contact patient for scheduled appointment for medication management. Left HIPAA compliant message for patient to return my call at their convenience.    Catie T. Fahed Morten, PharmD, BCACP, CPP [facility] [phone]  

## 2022-08-31 ENCOUNTER — Telehealth: Payer: Self-pay

## 2022-08-31 NOTE — Progress Notes (Unsigned)
   Care Guide Note  08/31/2022 Name: Tricia Bridges MRN: [id number] DOB: 07-02-1965  Referred by: Ivonne Andrew, NP Reason for referral : Care Coordination (Outreach to rs f/u with Pharm d )   Tricia Bridges is a 57 y.o. year old female who is a primary care patient of Ivonne Andrew, NP. Tricia Bridges Matters was referred to the pharmacist for assistance related to DM.    An unsuccessful telephone outreach was attempted today to contact the patient who was referred to the pharmacy team for assistance with medication management. Additional attempts will be made to contact the patient.   Penne Lash, RMA Care Guide [facility]  [place], [place] [address] Direct Dial: [phone] [email or web address]

## 2022-09-02 ENCOUNTER — Other Ambulatory Visit: Payer: Self-pay | Admitting: Nurse Practitioner

## 2022-09-02 DIAGNOSIS — E782 Mixed hyperlipidemia: Secondary | ICD-10-CM

## 2022-09-08 NOTE — Progress Notes (Unsigned)
   Care Guide Note  09/08/2022 Name: Tricia Bridges MRN: [id number] DOB: 03-07-1966  Referred by: Ivonne Andrew, NP Reason for referral : Care Coordination (Outreach to rs f/u with Pharm d )   Tricia Bridges is a 57 y.o. year old female who is a primary care patient of Ivonne Andrew, NP. Tricia Bridges was referred to the pharmacist for assistance related to DM.    A second unsuccessful telephone outreach was attempted today to contact the patient who was referred to the pharmacy team for assistance with medication management. Additional attempts will be made to contact the patient.  Tricia Bridges, RMA Care Guide [facility]  [place], [place] [address] Direct Dial: [phone] [email or web address]

## 2022-09-10 NOTE — Progress Notes (Signed)
   Care Guide Note  09/10/2022 Name: Tricia Bridges MRN: [id number] DOB: 15-Jul-1965  Referred by: Ivonne Andrew, NP Reason for referral : Care Coordination (Outreach to rs f/u with Pharm d )   Tricia Bridges is a 57 y.o. year old female who is a primary care patient of Ivonne Andrew, NP. Tricia Bridges was referred to the pharmacist for assistance related to DM.    A third unsuccessful telephone outreach was attempted today to contact the patient who was referred to the pharmacy team for assistance with medication management. The Population Health team is pleased to engage with this patient at any time in the future upon receipt of referral and should he/she be interested in assistance from the [facility] team.   Tricia Bridges, RMA Care Guide [facility]  [place], [place] [address] Direct Dial: [phone] [email or web address]

## 2022-09-28 ENCOUNTER — Other Ambulatory Visit: Payer: Self-pay | Admitting: Nurse Practitioner

## 2022-09-28 ENCOUNTER — Other Ambulatory Visit: Payer: Self-pay | Admitting: Family

## 2022-09-28 DIAGNOSIS — E782 Mixed hyperlipidemia: Secondary | ICD-10-CM

## 2022-10-26 ENCOUNTER — Ambulatory Visit: Payer: 59 | Admitting: Nurse Practitioner

## 2022-11-04 ENCOUNTER — Encounter: Payer: Self-pay | Admitting: Nurse Practitioner

## 2022-11-04 ENCOUNTER — Ambulatory Visit (INDEPENDENT_AMBULATORY_CARE_PROVIDER_SITE_OTHER): Payer: 59 | Admitting: Nurse Practitioner

## 2022-11-04 DIAGNOSIS — I63512 Cerebral infarction due to unspecified occlusion or stenosis of left middle cerebral artery: Secondary | ICD-10-CM

## 2022-11-04 DIAGNOSIS — G40901 Epilepsy, unspecified, not intractable, with status epilepticus: Secondary | ICD-10-CM

## 2022-11-04 DIAGNOSIS — E1165 Type 2 diabetes mellitus with hyperglycemia: Secondary | ICD-10-CM

## 2022-11-04 DIAGNOSIS — I10 Essential (primary) hypertension: Secondary | ICD-10-CM

## 2022-11-04 DIAGNOSIS — E782 Mixed hyperlipidemia: Secondary | ICD-10-CM

## 2022-11-04 LAB — POCT GLYCOSYLATED HEMOGLOBIN (HGB A1C): Hemoglobin A1C: 7.1 % — AB (ref 4.0–5.6)

## 2022-11-04 MED ORDER — LEVETIRACETAM 500 MG PO TABS
500.0000 mg | ORAL_TABLET | Freq: Two times a day (BID) | ORAL | 3 refills | Status: AC
Start: 2022-11-04 — End: ?

## 2022-11-04 MED ORDER — ATORVASTATIN CALCIUM 40 MG PO TABS
40.0000 mg | ORAL_TABLET | Freq: Every day | ORAL | 0 refills | Status: DC
Start: 2022-11-04 — End: 2023-03-16

## 2022-11-04 MED ORDER — INSULIN GLARGINE-YFGN 100 UNIT/ML ~~LOC~~ SOPN
25.0000 [IU] | PEN_INJECTOR | Freq: Two times a day (BID) | SUBCUTANEOUS | 3 refills | Status: DC
Start: 2022-11-04 — End: 2023-02-04

## 2022-11-04 MED ORDER — AMLODIPINE BESYLATE 10 MG PO TABS
10.0000 mg | ORAL_TABLET | Freq: Every day | ORAL | 3 refills | Status: AC
Start: 2022-11-04 — End: 2023-11-04

## 2022-11-04 MED ORDER — FLUTICASONE PROPIONATE 50 MCG/ACT NA SUSP: 2.0000 | Freq: Every day | NASAL | 6 refills | Status: AC

## 2022-11-04 MED ORDER — VALSARTAN-HYDROCHLOROTHIAZIDE 320-25 MG PO TABS
1.0000 | ORAL_TABLET | Freq: Every day | ORAL | 3 refills | Status: AC
Start: 2022-11-04 — End: ?

## 2022-11-04 NOTE — Assessment & Plan Note (Signed)
-   levETIRAcetam (KEPPRA) 500 MG tablet; Take 1 tablet (500 mg total) by mouth 2 (two) times daily.  Dispense: 180 tablet; Refill: 3   2. Cerebrovascular accident (CVA) due to occlusion of left middle cerebral artery (HCC)  - levETIRAcetam (KEPPRA) 500 MG tablet; Take 1 tablet (500 mg total) by mouth 2 (two) times daily.  Dispense: 180 tablet; Refill: 3   3. Type 2 diabetes mellitus with hyperglycemia, without long-term current use of insulin (HCC)  - POCT glycosylated hemoglobin (Hb A1C) - insulin glargine-yfgn (SEMGLEE, YFGN,) 100 UNIT/ML Pen; Inject 25 Units into the skin 2 (two) times daily.  Dispense: 3 mL; Refill: 3   4. Essential hypertension  - amLODipine (NORVASC) 10 MG tablet; Take 1 tablet (10 mg total) by mouth daily.  Dispense: 90 tablet; Refill: 3 - valsartan-hydrochlorothiazide (DIOVAN-HCT) 320-25 MG tablet; Take 1 tablet by mouth daily.  Dispense: 90 tablet; Refill: 3   5. Mixed hyperlipidemia  - atorvastatin (LIPITOR) 40 MG tablet; Take 1 tablet (40 mg total) by mouth daily.  Dispense: 90 tablet; Refill: 0    Follow up:  Follow up in 3 months

## 2022-11-04 NOTE — Progress Notes (Signed)
@Patient  ID: Tricia Bridges, female    DOB: 1965-11-09, 57 y.o.   MRN: [id number]  Chief Complaint  Patient presents with   Diabetes    Follow up     Referring provider: Ivonne Andrew, NP   HPI  57 year old female with history of stroke, hypertension, diabetes, seizures, carpal tunnel syndrome, mixed hyperlipidemia.    Patient presents today for follow-up visit.  Overall she has been doing well since her last visit here.  A1c is improving. BS ranging 120's.  Blood pressure is within normal range today.  Patient states that she is compliant with medications.  She has no new issues or concerns today. Denies f/c/s, n/v/d, hemoptysis, PND, leg swelling. Denies chest pain or edema.      Allergies  Allergen Reactions   Penicillins Itching    Has patient had a PCN reaction causing immediate rash, facial/tongue/throat swelling, SOB or lightheadedness with hypotension: no Has patient had a PCN reaction causing severe rash involving mucus membranes or skin necrosis: no Has patient had a PCN reaction that required hospitalization: no Has patient had a PCN reaction occurring within the last 10 years: yes If all of the above answers are "NO", then may proceed with Cephalosporin use.     Immunization History  Administered Date(s) Administered   Influenza,inj,Quad PF,6+ Mos 01/29/2016, 02/25/2018   Pneumococcal Polysaccharide-23 08/13/2017   Tdap 08/13/2017    Past Medical History:  Diagnosis Date   Diabetes mellitus without complication (HCC)    Hypertension    Pre-diabetes    Seizures (HCC)    Stroke (cerebrum) (HCC) 09/30/2020    Tobacco History: Social History   Tobacco Use  Smoking Status Never  Smokeless Tobacco Never   Counseling given: Not Answered   Outpatient Encounter Medications as of 11/04/2022  Medication Sig   aspirin EC 81 MG tablet Take 1 tablet (81 mg total) by mouth daily. Swallow whole.   Cholecalciferol (VITAMIN D PO) Take 1 tablet by mouth  daily.   glucose blood (ACCU-CHEK GUIDE) test strip Use as directed two time daily.   Insulin Pen Needle (PEN NEEDLES 31GX5/16") 31G X 8 MM MISC Use as directed   metFORMIN (GLUCOPHAGE-XR) 500 MG 24 hr tablet TAKE 1 TABLET BY MOUTH EVERY DAY WITH BREAKFAST   [DISCONTINUED] amLODipine (NORVASC) 10 MG tablet Take 1 tablet (10 mg total) by mouth daily.   [DISCONTINUED] atorvastatin (LIPITOR) 40 MG tablet TAKE 1 TABLET BY MOUTH EVERY DAY   [DISCONTINUED] fluticasone (FLONASE) 50 MCG/ACT nasal spray Place 2 sprays into both nostrils daily.   [DISCONTINUED] insulin glargine-yfgn (SEMGLEE, YFGN,) 100 UNIT/ML Pen INJECT 25 UNITS INTO THE SKIN 2 (TWO) TIMES DAILY.   [DISCONTINUED] levETIRAcetam (KEPPRA) 500 MG tablet Take 1 tablet (500 mg total) by mouth 2 (two) times daily.   [DISCONTINUED] valsartan-hydrochlorothiazide (DIOVAN-HCT) 320-25 MG tablet Take 1 tablet by mouth daily.   amLODipine (NORVASC) 10 MG tablet Take 1 tablet (10 mg total) by mouth daily.   atorvastatin (LIPITOR) 40 MG tablet Take 1 tablet (40 mg total) by mouth daily.   fluticasone (FLONASE) 50 MCG/ACT nasal spray Place 2 sprays into both nostrils daily.   insulin glargine-yfgn (SEMGLEE, YFGN,) 100 UNIT/ML Pen Inject 25 Units into the skin 2 (two) times daily.   levETIRAcetam (KEPPRA) 500 MG tablet Take 1 tablet (500 mg total) by mouth 2 (two) times daily.   valsartan-hydrochlorothiazide (DIOVAN-HCT) 320-25 MG tablet Take 1 tablet by mouth daily.   No facility-administered encounter medications on file as of  11/04/2022.     Review of Systems  Review of Systems  Constitutional: Negative.   HENT: Negative.    Cardiovascular: Negative.   Gastrointestinal: Negative.   Allergic/Immunologic: Negative.   Neurological: Negative.   Psychiatric/Behavioral: Negative.         Physical Exam  BP 119/74   Pulse 62   Temp (!) 97.1 F (36.2 C)   Ht 5\' 5"  (1.651 m)   Wt 154 lb 6.4 oz (70 kg)   SpO2 100%   BMI 25.69 kg/m   Wt  Readings from Last 5 Encounters:  11/04/22 154 lb 6.4 oz (70 kg)  07/20/22 153 lb 3.2 oz (69.5 kg)  04/20/22 158 lb (71.7 kg)  03/26/22 160 lb (72.6 kg)  10/07/21 158 lb 12.8 oz (72 kg)     Physical Exam Vitals and nursing note reviewed.  Constitutional:      General: She is not in acute distress.    Appearance: She is well-developed.  Cardiovascular:     Rate and Rhythm: Normal rate and regular rhythm.  Pulmonary:     Effort: Pulmonary effort is normal.     Breath sounds: Normal breath sounds.  Neurological:     Mental Status: She is alert and oriented to person, place, and time.      Lab Results:  CBC    Component Value Date/Time   WBC 8.9 04/20/2022 1503   WBC 11.4 (H) 11/17/2020 2220   RBC 4.25 04/20/2022 1503   RBC 4.56 11/17/2020 2220   HGB 12.5 04/20/2022 1503   HCT 36.7 04/20/2022 1503   PLT 252 04/20/2022 1503   MCV 86 04/20/2022 1503   MCH 29.4 04/20/2022 1503   MCH 29.6 11/17/2020 2220   MCHC 34.1 04/20/2022 1503   MCHC 33.5 11/17/2020 2220   RDW 12.8 04/20/2022 1503   LYMPHSABS 1.9 10/07/2021 1057   MONOABS 0.8 11/17/2020 2220   EOSABS 0.2 10/07/2021 1057   BASOSABS 0.1 10/07/2021 1057    BMET    Component Value Date/Time   NA 144 04/20/2022 1503   K 3.5 04/20/2022 1503   CL 102 04/20/2022 1503   CO2 28 04/20/2022 1503   GLUCOSE 165 (H) 04/20/2022 1503   GLUCOSE 287 (H) 11/17/2020 2220   BUN 19 04/20/2022 1503   CREATININE 1.07 (H) 04/20/2022 1503   CALCIUM 9.9 04/20/2022 1503   GFRNONAA >60 11/17/2020 2220   GFRAA 49 (L) 06/15/2016 1727     Assessment & Plan:   Status epilepticus (HCC) - levETIRAcetam (KEPPRA) 500 MG tablet; Take 1 tablet (500 mg total) by mouth 2 (two) times daily.  Dispense: 180 tablet; Refill: 3   2. Cerebrovascular accident (CVA) due to occlusion of left middle cerebral artery (HCC)  - levETIRAcetam (KEPPRA) 500 MG tablet; Take 1 tablet (500 mg total) by mouth 2 (two) times daily.  Dispense: 180 tablet;  Refill: 3   3. Type 2 diabetes mellitus with hyperglycemia, without long-term current use of insulin (HCC)  - POCT glycosylated hemoglobin (Hb A1C) - insulin glargine-yfgn (SEMGLEE, YFGN,) 100 UNIT/ML Pen; Inject 25 Units into the skin 2 (two) times daily.  Dispense: 3 mL; Refill: 3   4. Essential hypertension  - amLODipine (NORVASC) 10 MG tablet; Take 1 tablet (10 mg total) by mouth daily.  Dispense: 90 tablet; Refill: 3 - valsartan-hydrochlorothiazide (DIOVAN-HCT) 320-25 MG tablet; Take 1 tablet by mouth daily.  Dispense: 90 tablet; Refill: 3   5. Mixed hyperlipidemia  - atorvastatin (LIPITOR) 40 MG tablet; Take 1  tablet (40 mg total) by mouth daily.  Dispense: 90 tablet; Refill: 0    Follow up:  Follow up in 3 months     Ivonne Andrew, NP 11/04/2022

## 2022-11-04 NOTE — Patient Instructions (Addendum)
1. Status epilepticus (HCC)  - levETIRAcetam (KEPPRA) 500 MG tablet; Take 1 tablet (500 mg total) by mouth 2 (two) times daily.  Dispense: 180 tablet; Refill: 3   2. Cerebrovascular accident (CVA) due to occlusion of left middle cerebral artery (HCC)  - levETIRAcetam (KEPPRA) 500 MG tablet; Take 1 tablet (500 mg total) by mouth 2 (two) times daily.  Dispense: 180 tablet; Refill: 3   3. Type 2 diabetes mellitus with hyperglycemia, without long-term current use of insulin (HCC)  - POCT glycosylated hemoglobin (Hb A1C) - insulin glargine-yfgn (SEMGLEE, YFGN,) 100 UNIT/ML Pen; Inject 25 Units into the skin 2 (two) times daily.  Dispense: 3 mL; Refill: 3   4. Essential hypertension  - amLODipine (NORVASC) 10 MG tablet; Take 1 tablet (10 mg total) by mouth daily.  Dispense: 90 tablet; Refill: 3 - valsartan-hydrochlorothiazide (DIOVAN-HCT) 320-25 MG tablet; Take 1 tablet by mouth daily.  Dispense: 90 tablet; Refill: 3   5. Mixed hyperlipidemia  - atorvastatin (LIPITOR) 40 MG tablet; Take 1 tablet (40 mg total) by mouth daily.  Dispense: 90 tablet; Refill: 0    Follow up:  Follow up in 3 months

## 2022-11-05 LAB — CBC
Hematocrit: 38 % (ref 34.0–46.6)
Hemoglobin: 12.6 g/dL (ref 11.1–15.9)
MCH: 29 pg (ref 26.6–33.0)
MCHC: 33.2 g/dL (ref 31.5–35.7)
Platelets: 243 10*3/uL (ref 150–450)
RBC: 4.35 x10E6/uL (ref 3.77–5.28)
WBC: 7.6 10*3/uL (ref 3.4–10.8)

## 2022-11-05 LAB — COMPREHENSIVE METABOLIC PANEL
ALT: 29 IU/L (ref 0–32)
AST: 21 IU/L (ref 0–40)
Albumin: 4.3 g/dL (ref 3.8–4.9)
Alkaline Phosphatase: 102 IU/L (ref 44–121)
BUN/Creatinine Ratio: 18 (ref 9–23)
Bilirubin Total: <0.2 mg/dL (ref 0.0–1.2)
CO2: 27 mmol/L (ref 20–29)
Calcium: 9.8 mg/dL (ref 8.7–10.2)
Creatinine, Ser: 1.25 mg/dL — ABNORMAL HIGH (ref 0.57–1.00)
Globulin, Total: 3 g/dL (ref 1.5–4.5)
Glucose: 95 mg/dL (ref 70–99)
Potassium: 3.9 mmol/L (ref 3.5–5.2)
Sodium: 143 mmol/L (ref 134–144)
Total Protein: 7.3 g/dL (ref 6.0–8.5)
eGFR: 50 mL/min/{1.73_m2} — ABNORMAL LOW (ref >59)

## 2022-11-20 NOTE — Telephone Encounter (Signed)
Pt called asking for a letter to take more days off and stated she spoke with her HR and this is what they are requesting for Saturdays and Sundays  Due to her health.  Please advise

## 2023-01-13 ENCOUNTER — Other Ambulatory Visit: Payer: Self-pay | Admitting: Nurse Practitioner

## 2023-01-23 ENCOUNTER — Other Ambulatory Visit: Payer: Self-pay | Admitting: Nurse Practitioner

## 2023-01-23 DIAGNOSIS — I10 Essential (primary) hypertension: Secondary | ICD-10-CM

## 2023-02-04 ENCOUNTER — Ambulatory Visit: Payer: 59 | Admitting: Nurse Practitioner

## 2023-02-04 ENCOUNTER — Encounter: Payer: Self-pay | Admitting: Nurse Practitioner

## 2023-02-04 DIAGNOSIS — E1165 Type 2 diabetes mellitus with hyperglycemia: Secondary | ICD-10-CM | POA: Diagnosis not present

## 2023-02-04 LAB — POCT GLYCOSYLATED HEMOGLOBIN (HGB A1C): Hemoglobin A1C: 8 % — AB (ref 4.0–5.6)

## 2023-02-04 MED ORDER — METFORMIN HCL ER 500 MG PO TB24
500.0000 mg | ORAL_TABLET | Freq: Two times a day (BID) | ORAL | 0 refills | Status: AC
Start: 2023-02-04 — End: ?

## 2023-02-04 MED ORDER — INSULIN GLARGINE-YFGN 100 UNIT/ML ~~LOC~~ SOPN
25.0000 [IU] | PEN_INJECTOR | Freq: Two times a day (BID) | SUBCUTANEOUS | 3 refills | Status: DC
Start: 2023-02-04 — End: 2023-02-04

## 2023-02-04 MED ORDER — INSULIN GLARGINE-YFGN 100 UNIT/ML ~~LOC~~ SOPN: 25.0000 [IU] | PEN_INJECTOR | Freq: Two times a day (BID) | SUBCUTANEOUS | 3 refills | Status: AC

## 2023-02-04 NOTE — Patient Instructions (Signed)
1. Type 2 diabetes mellitus with hyperglycemia, without long-term current use of insulin (HCC)  - POCT glycosylated hemoglobin (Hb A1C) - metFORMIN (GLUCOPHAGE-XR) 500 MG 24 hr tablet; Take 1 tablet (500 mg total) by mouth 2 (two) times daily with a meal.  Dispense: 180 tablet; Refill: 0 - insulin glargine-yfgn (SEMGLEE, YFGN,) 100 UNIT/ML Pen; Inject 25 Units into the skin 2 (two) times daily.  Dispense: 3 mL; Refill: 3   Follow up:  Follow up in 3 months

## 2023-02-04 NOTE — Progress Notes (Signed)
Subjective   Patient ID: Tricia Bridges, female    DOB: 16-May-1965, 57 y.o.   MRN: [id number]  Chief Complaint  Patient presents with   Diabetes   Hypertension    Referring provider: Ivonne Andrew, NP  Marta Antu is a 57 y.o. female with Past Medical History: No date: Diabetes mellitus without complication (HCC) No date: Hypertension No date: Pre-diabetes No date: Seizures (HCC) 09/30/2020: Stroke (cerebrum) [facility])  HPI  Patient presents today for follow-up visit.  Overall she has been doing well since her last visit here.  A1c up to 8.0 from 7.1 at last visit.  Will increase metformin to BID. Blood pressure is within normal range today.  Patient states that she is compliant with medications.  She has no new issues or concerns today. Denies f/c/s, n/v/d, hemoptysis, PND, leg swelling. Denies chest pain or edema.    Allergies  Allergen Reactions   Penicillins Itching    Has patient had a PCN reaction causing immediate rash, facial/tongue/throat swelling, SOB or lightheadedness with hypotension: no Has patient had a PCN reaction causing severe rash involving mucus membranes or skin necrosis: no Has patient had a PCN reaction that required hospitalization: no Has patient had a PCN reaction occurring within the last 10 years: yes If all of the above answers are "NO", then may proceed with Cephalosporin use.     Immunization History  Administered Date(s) Administered   Influenza,inj,Quad PF,6+ Mos 01/29/2016, 02/25/2018   Influenza-Unspecified 02/03/2023   Pneumococcal Polysaccharide-23 08/13/2017   Tdap 08/13/2017    Tobacco History: Social History   Tobacco Use  Smoking Status Never  Smokeless Tobacco Never   Counseling given: Not Answered   Outpatient Encounter Medications as of 02/04/2023  Medication Sig   amLODipine (NORVASC) 10 MG tablet Take 1 tablet (10 mg total) by mouth daily.   aspirin EC 81 MG tablet Take 1 tablet (81 mg total) by mouth  daily. Swallow whole.   atorvastatin (LIPITOR) 40 MG tablet Take 1 tablet (40 mg total) by mouth daily.   Cholecalciferol (VITAMIN D PO) Take 1 tablet by mouth daily.   fluticasone (FLONASE) 50 MCG/ACT nasal spray Place 2 sprays into both nostrils daily.   glucose blood (ACCU-CHEK GUIDE) test strip Use as directed two time daily.   Insulin Pen Needle (PEN NEEDLES 31GX5/16") 31G X 8 MM MISC Use as directed   levETIRAcetam (KEPPRA) 500 MG tablet Take 1 tablet (500 mg total) by mouth 2 (two) times daily.   valsartan-hydrochlorothiazide (DIOVAN-HCT) 320-25 MG tablet Take 1 tablet by mouth daily.   [DISCONTINUED] insulin glargine-yfgn (SEMGLEE, YFGN,) 100 UNIT/ML Pen Inject 25 Units into the skin 2 (two) times daily.   [DISCONTINUED] metFORMIN (GLUCOPHAGE-XR) 500 MG 24 hr tablet TAKE 1 TABLET BY MOUTH EVERY DAY WITH BREAKFAST   insulin glargine-yfgn (SEMGLEE, YFGN,) 100 UNIT/ML Pen Inject 25 Units into the skin 2 (two) times daily.   metFORMIN (GLUCOPHAGE-XR) 500 MG 24 hr tablet Take 1 tablet (500 mg total) by mouth 2 (two) times daily with a meal.   [DISCONTINUED] insulin glargine-yfgn (SEMGLEE, YFGN,) 100 UNIT/ML Pen Inject 25 Units into the skin 2 (two) times daily.   No facility-administered encounter medications on file as of 02/04/2023.    Review of Systems  Review of Systems  Constitutional: Negative.   HENT: Negative.    Cardiovascular: Negative.   Gastrointestinal: Negative.   Allergic/Immunologic: Negative.   Neurological: Negative.   Psychiatric/Behavioral: Negative.       Objective:  BP 121/70   Pulse 74   Temp (!) 97.4 F (36.3 C)   Ht 5\' 4"  (1.626 m)   Wt 156 lb 12.8 oz (71.1 kg)   SpO2 100%   BMI 26.91 kg/m   Wt Readings from Last 5 Encounters:  02/04/23 156 lb 12.8 oz (71.1 kg)  11/04/22 154 lb 6.4 oz (70 kg)  07/20/22 153 lb 3.2 oz (69.5 kg)  04/20/22 158 lb (71.7 kg)  03/26/22 160 lb (72.6 kg)     Physical Exam Vitals and nursing note reviewed.   Constitutional:      General: She is not in acute distress.    Appearance: She is well-developed.  Cardiovascular:     Rate and Rhythm: Normal rate and regular rhythm.  Pulmonary:     Effort: Pulmonary effort is normal.     Breath sounds: Normal breath sounds.  Neurological:     Mental Status: She is alert and oriented to person, place, and time.       Assessment & Plan:   Type 2 diabetes mellitus with hyperglycemia, without long-term current use of insulin (HCC) -     POCT glycosylated hemoglobin (Hb A1C) -     metFORMIN HCl ER; Take 1 tablet (500 mg total) by mouth 2 (two) times daily with a meal.  Dispense: 180 tablet; Refill: 0 -     Insulin Glargine-yfgn; Inject 25 Units into the skin 2 (two) times daily.  Dispense: 3 mL; Refill: 3     Return in about 3 months (around 05/07/2023).   Ivonne Andrew, NP 02/04/2023

## 2023-02-09 ENCOUNTER — Other Ambulatory Visit: Payer: Self-pay | Admitting: Nurse Practitioner

## 2023-02-09 DIAGNOSIS — I10 Essential (primary) hypertension: Secondary | ICD-10-CM

## 2023-03-09 ENCOUNTER — Telehealth: Payer: Self-pay | Admitting: Neurology

## 2023-03-09 NOTE — Telephone Encounter (Signed)
LVM and sent mychart msg informing pt of need to reschedule 03/18/23 appt - NP out

## 2023-03-16 ENCOUNTER — Other Ambulatory Visit: Payer: Self-pay | Admitting: Nurse Practitioner

## 2023-03-16 DIAGNOSIS — E782 Mixed hyperlipidemia: Secondary | ICD-10-CM

## 2023-03-18 ENCOUNTER — Ambulatory Visit: Payer: 59 | Admitting: Neurology

## 2023-05-06 ENCOUNTER — Ambulatory Visit: Payer: Self-pay | Admitting: Nurse Practitioner

## 2023-05-12 ENCOUNTER — Other Ambulatory Visit: Payer: Self-pay | Admitting: Nurse Practitioner

## 2023-05-12 DIAGNOSIS — E782 Mixed hyperlipidemia: Secondary | ICD-10-CM

## 2023-06-09 IMAGING — MG MM DIGITAL SCREENING BILAT W/ TOMO AND CAD
8 series · 8 of 24 positions shown · non-contrast
Comparison: None.

CLINICAL DATA: Screening.

EXAM:
DIGITAL SCREENING BILATERAL MAMMOGRAM WITH TOMOSYNTHESIS AND CAD
TECHNIQUE: Bilateral screening digital craniocaudal and mediolateral oblique
mammograms were obtained. Bilateral screening digital breast
tomosynthesis was performed. The images were evaluated with
computer-aided detection.

[R CC synth-2D]
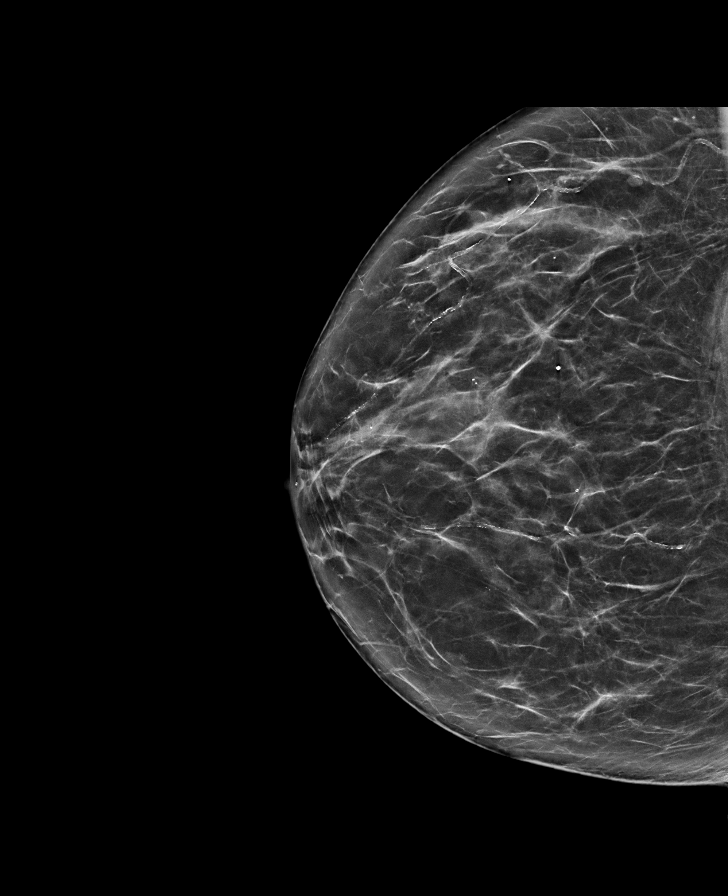

[L MLO synth-2D]
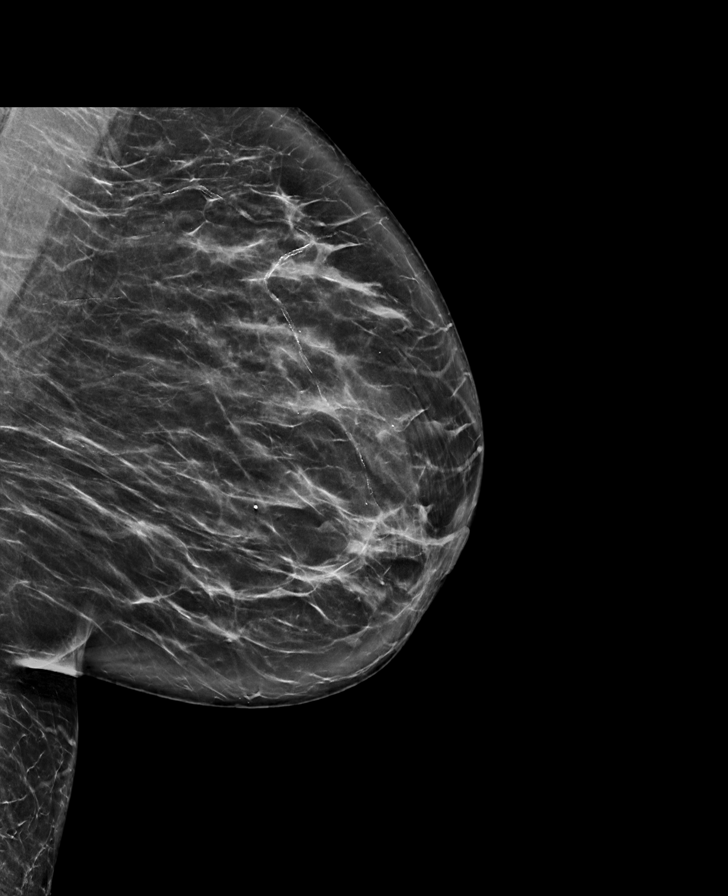

[R MLO synth-2D]
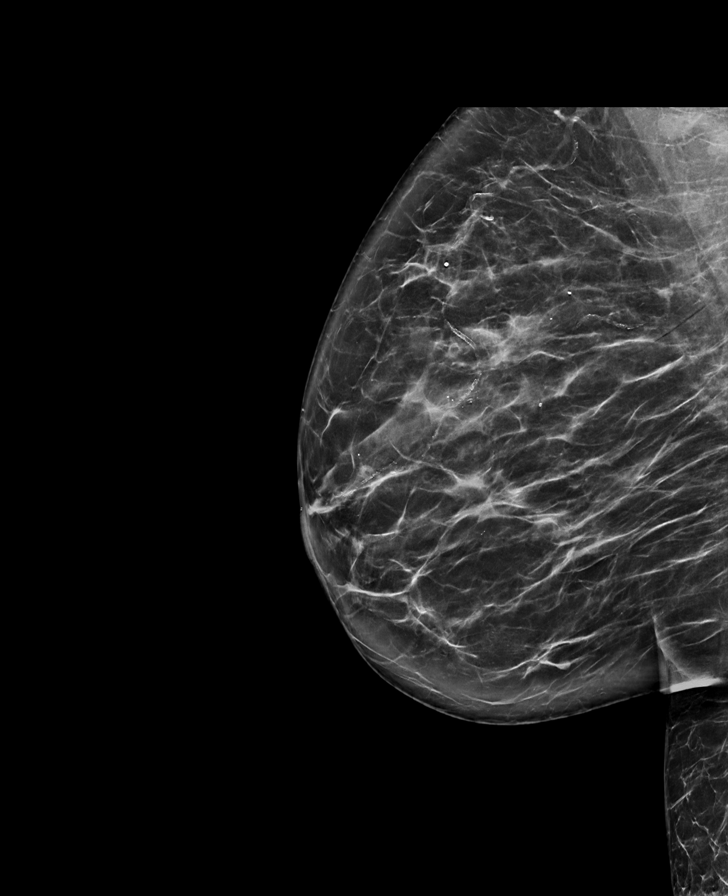

[L CC synth-2D]
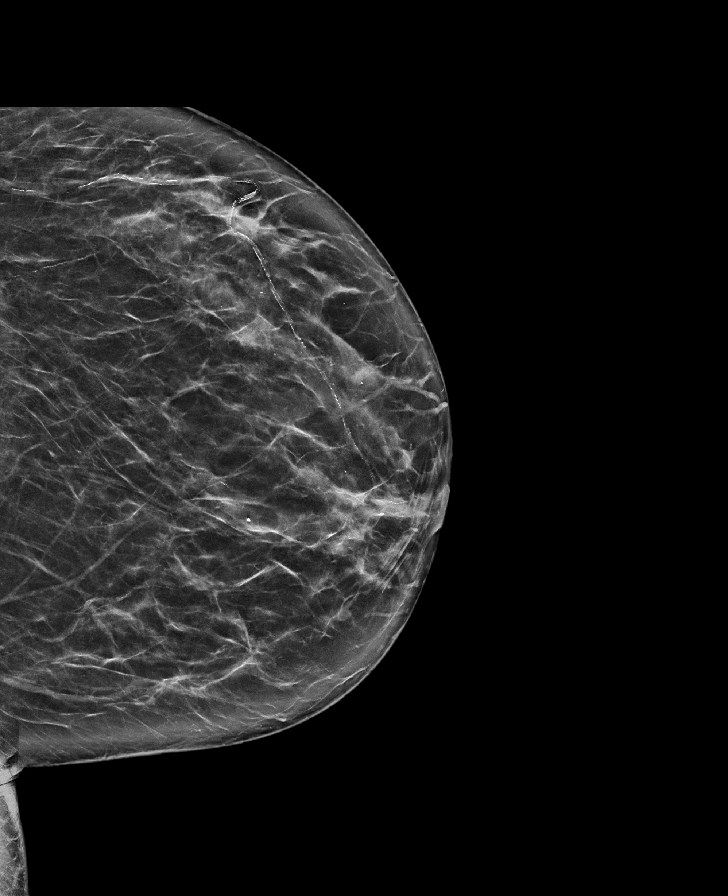

[R MLO tomo · tomo slice 43/85.0]
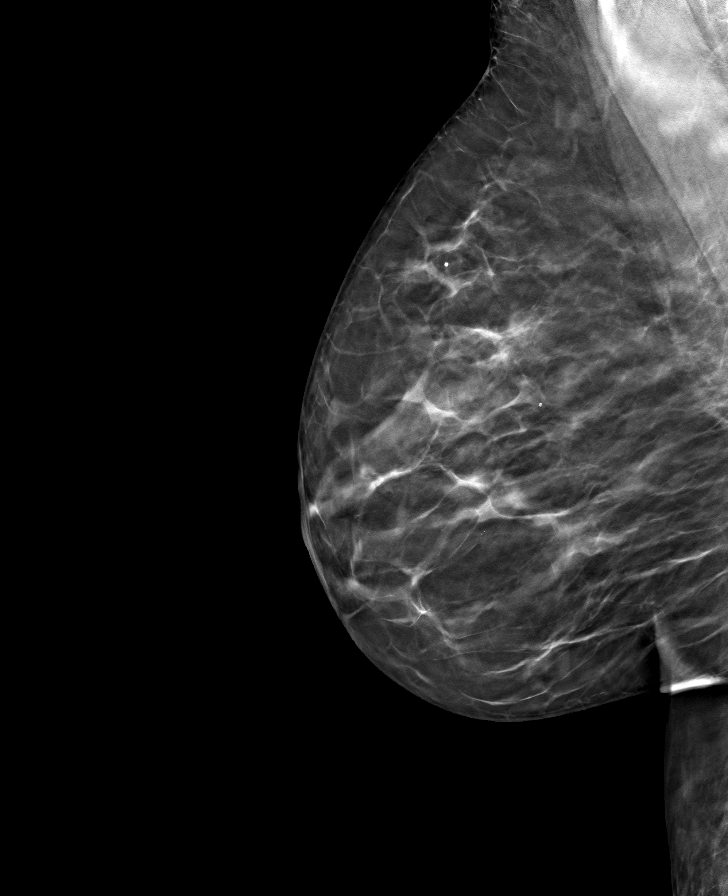

[R CC tomo · tomo slice 39/76.0]
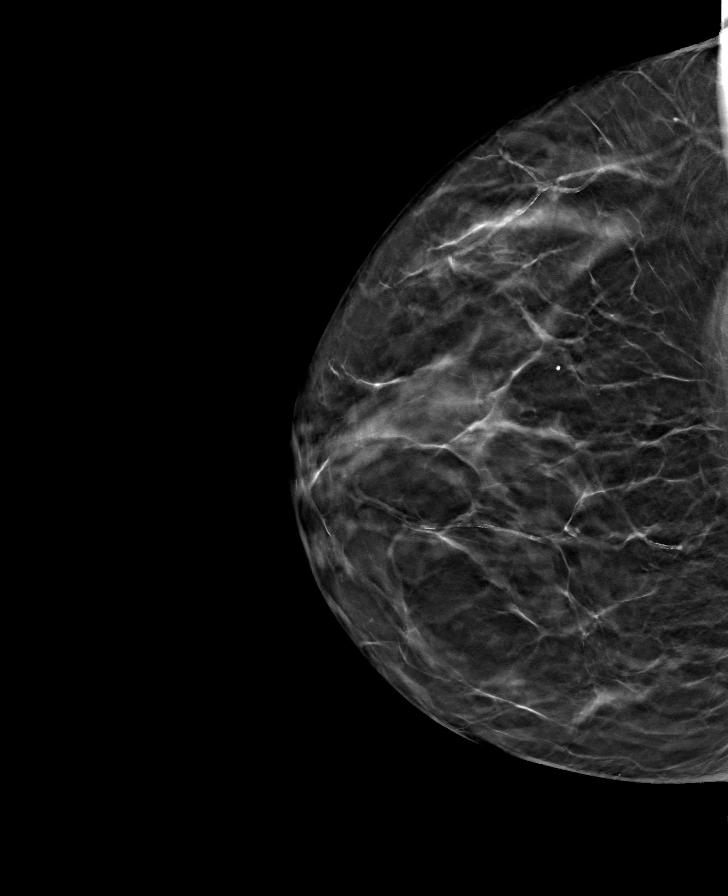

[L CC tomo · tomo slice 39/78.0]
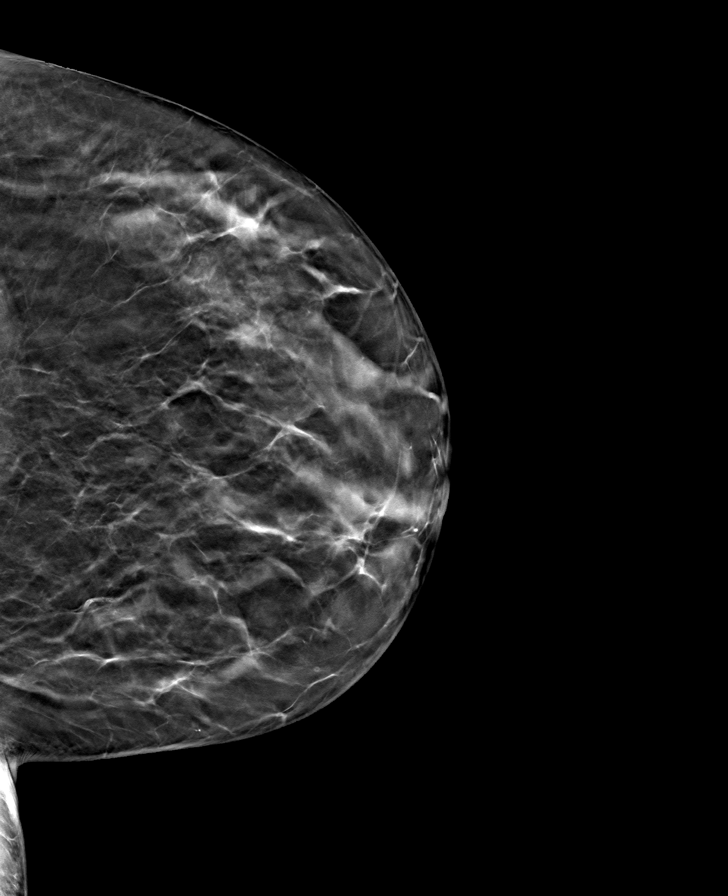

[L MLO tomo · tomo slice 43/85.0]
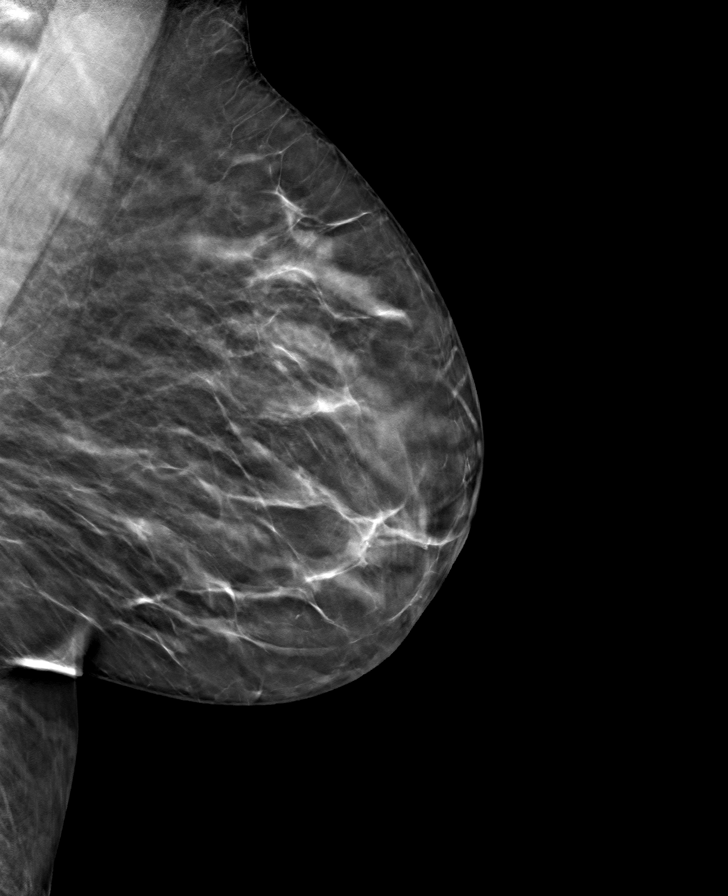

[8 of 24 positions shown; findings below may reference images not displayed]

ACR Breast Density Category c: The breast tissue is heterogeneously
dense, which may obscure small masses
FINDINGS: There are no findings suspicious for malignancy.
IMPRESSION: No mammographic evidence of malignancy. A result letter of this
screening mammogram will be mailed directly to the patient.

RECOMMENDATION:
Screening mammogram in one year. (Code:C8-T-HNK)

BI-RADS CATEGORY  1: Negative.

## 2023-06-16 ENCOUNTER — Ambulatory Visit: Payer: Self-pay | Admitting: Nurse Practitioner

## 2024-01-12 ENCOUNTER — Other Ambulatory Visit: Payer: Self-pay | Admitting: Medical Genetics
